# Patient Record
Sex: Male | Born: 1970 | Race: White | Hispanic: No | Marital: Married | Smoking: Current every day smoker
Health system: Southern US, Community
[De-identification: ages and names within clinical notes are randomized; demographics above are authoritative.]

## PROBLEM LIST (undated history)

## (undated) DIAGNOSIS — I38 Endocarditis, valve unspecified: Secondary | ICD-10-CM

## (undated) DIAGNOSIS — R7881 Bacteremia: Secondary | ICD-10-CM

## (undated) DIAGNOSIS — M4624 Osteomyelitis of vertebra, thoracic region: Secondary | ICD-10-CM

## (undated) DIAGNOSIS — E785 Hyperlipidemia, unspecified: Secondary | ICD-10-CM

## (undated) HISTORY — PX: POSTERIOR LAMINECTOMY THORACIC SPINE: SHX2252

---

## 2010-01-08 ENCOUNTER — Emergency Department: Payer: Self-pay | Admitting: Emergency Medicine

## 2012-03-02 ENCOUNTER — Emergency Department: Payer: Self-pay | Admitting: Emergency Medicine

## 2012-03-02 LAB — BASIC METABOLIC PANEL
Anion Gap: 5 — ABNORMAL LOW (ref 7–16)
Chloride: 106 mmol/L (ref 98–107)
Co2: 27 mmol/L (ref 21–32)
Creatinine: 1.13 mg/dL (ref 0.60–1.30)
Potassium: 4.5 mmol/L (ref 3.5–5.1)

## 2012-03-02 LAB — CBC
MCH: 29 pg (ref 26.0–34.0)
MCHC: 34.3 g/dL (ref 32.0–36.0)
MCV: 85 fL (ref 80–100)
Platelet: 181 10*3/uL (ref 150–440)
RDW: 13.8 % (ref 11.5–14.5)
WBC: 11 10*3/uL — ABNORMAL HIGH (ref 3.8–10.6)

## 2012-03-02 LAB — CK TOTAL AND CKMB (NOT AT ARMC)
CK, Total: 79 U/L (ref 35–232)
CK-MB: <0.5 ng/mL — ABNORMAL LOW (ref 0.5–3.6)

## 2013-09-08 ENCOUNTER — Ambulatory Visit: Payer: Self-pay | Admitting: Physician Assistant

## 2015-02-17 ENCOUNTER — Emergency Department
Admission: EM | Admit: 2015-02-17 | Discharge: 2015-02-18 | Disposition: A | Discharge status: Home / Self Care | Payer: Self-pay | Attending: Emergency Medicine | Admitting: Emergency Medicine

## 2015-02-17 ENCOUNTER — Emergency Department: Payer: Self-pay

## 2015-02-17 DIAGNOSIS — Z720 Tobacco use: Secondary | ICD-10-CM | POA: Insufficient documentation

## 2015-02-17 DIAGNOSIS — I10 Essential (primary) hypertension: Secondary | ICD-10-CM | POA: Insufficient documentation

## 2015-02-17 DIAGNOSIS — N23 Unspecified renal colic: Secondary | ICD-10-CM | POA: Insufficient documentation

## 2015-02-17 HISTORY — DX: Hyperlipidemia, unspecified: E78.5

## 2015-02-17 LAB — URINALYSIS COMPLETE WITH MICROSCOPIC (ARMC ONLY)
Bilirubin Urine: NEGATIVE
Glucose, UA: NEGATIVE mg/dL
Ketones, ur: NEGATIVE mg/dL
LEUKOCYTES UA: NEGATIVE
NITRITE: NEGATIVE
PH: 8 (ref 5.0–8.0)
PROTEIN: 100 mg/dL — AB
Specific Gravity, Urine: 1.019 (ref 1.005–1.030)
Squamous Epithelial / LPF: NONE SEEN

## 2015-02-17 LAB — BASIC METABOLIC PANEL
Anion gap: 9 (ref 5–15)
BUN: 15 mg/dL (ref 6–20)
CO2: 24 mmol/L (ref 22–32)
Calcium: 8.9 mg/dL (ref 8.9–10.3)
Chloride: 109 mmol/L (ref 101–111)
Creatinine, Ser: 1.29 mg/dL — ABNORMAL HIGH (ref 0.61–1.24)
GFR calc Af Amer: >60 mL/min (ref >60)
Glucose, Bld: 106 mg/dL — ABNORMAL HIGH (ref 65–99)
POTASSIUM: 4.2 mmol/L (ref 3.5–5.1)
SODIUM: 142 mmol/L (ref 135–145)

## 2015-02-17 LAB — CBC WITH DIFFERENTIAL/PLATELET
BASOS ABS: 0.1 10*3/uL (ref 0–0.1)
BASOS PCT: 1 %
EOS PCT: 1 %
Eosinophils Absolute: 0.1 10*3/uL (ref 0–0.7)
HCT: 39.4 % — ABNORMAL LOW (ref 40.0–52.0)
Hemoglobin: 13.1 g/dL (ref 13.0–18.0)
Lymphocytes Relative: 14 %
Lymphs Abs: 1.3 10*3/uL (ref 1.0–3.6)
MCH: 28.6 pg (ref 26.0–34.0)
MCHC: 33.1 g/dL (ref 32.0–36.0)
MCV: 86.3 fL (ref 80.0–100.0)
Monocytes Absolute: 0.4 10*3/uL (ref 0.2–1.0)
Monocytes Relative: 4 %
NEUTROS ABS: 7.4 10*3/uL — AB (ref 1.4–6.5)
Neutrophils Relative %: 80 %
PLATELETS: 311 10*3/uL (ref 150–440)
RBC: 4.57 MIL/uL (ref 4.40–5.90)
RDW: 12.9 % (ref 11.5–14.5)
WBC: 9.1 10*3/uL (ref 3.8–10.6)

## 2015-02-17 LAB — CK: Total CK: 82 U/L (ref 49–397)

## 2015-02-17 MED ORDER — ONDANSETRON HCL 4 MG/2ML IJ SOLN
INTRAMUSCULAR | Status: AC
  Administered 2015-02-17: 4 mg via INTRAVENOUS
  Filled 2015-02-17: qty 2

## 2015-02-17 MED ORDER — SODIUM CHLORIDE 0.9 % IV BOLUS (SEPSIS)
1000.0000 mL | Freq: Once | INTRAVENOUS | Status: AC
  Administered 2015-02-17: 1000 mL via INTRAVENOUS

## 2015-02-17 MED ORDER — KETOROLAC TROMETHAMINE 30 MG/ML IJ SOLN
30.0000 mg | Freq: Once | INTRAMUSCULAR | Status: AC
  Administered 2015-02-17: 30 mg via INTRAVENOUS

## 2015-02-17 MED ORDER — ONDANSETRON HCL 4 MG/2ML IJ SOLN
4.0000 mg | Freq: Once | INTRAMUSCULAR | Status: AC
  Administered 2015-02-17: 4 mg via INTRAVENOUS

## 2015-02-17 MED ORDER — KETOROLAC TROMETHAMINE 30 MG/ML IJ SOLN
INTRAMUSCULAR | Status: AC
  Administered 2015-02-17: 30 mg via INTRAVENOUS
  Filled 2015-02-17: qty 1

## 2015-02-17 NOTE — ED Notes (Signed)
Pt presents to ED with c/o severe abdominal pain. Per EMS, pt reports the abdominal pain started after mowing lawns all day, but reports keeping hydrated. Pt reports difficulty urinating; urine is dark. Pt reports nausea, but no vomiting. VS per EMS: BP 146/110, HR 60's, oral temp of 98.5, CBG 95

## 2015-02-17 NOTE — ED Provider Notes (Signed)
[facility] Emergency Department Provider Note ____________________________________________  Time seen: Approximately 9PM  I have reviewed the triage vital signs and the nursing notes.   HISTORY  Chief Complaint Abdominal Pain    HPI Derek Dunn is a 44 y.o. male presents with sudden onset abdominal pain over his umbilicus radiating to his right side and to his right lower quadrant at 6 PM. He is nauseated but has not vomited and had a normal normal bowel movement 2 h ago. He reports pain is worse with urinating and breathing.  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  He was drinking some water and Gatorade but noted his urine was dark, nonbloody. He denies any genital pain. He does endorse "smoking some dope " 2-3 hours ago and does not want his family to know. He does not think this is associated with the pain as he does this from time to time and this was not unusual for him. Denies chest pain or shortness of breath.  He has never had an episode like this before. EMS states his blood pressure was minimally elevated at 156/105. They gave him a 500 normal saline bolus.  Past Medical History  Diagnosis Date  . Hypertension   . Hyperlipidemia     There are no active problems to display for this patient.   History reviewed. No pertinent past surgical history.  No current outpatient prescriptions on file.  Allergies Review of patient's allergies indicates no known allergies.  No family history on file.  Social History History  Substance Use Topics  . Smoking status: Current Every Day Smoker  . Smokeless tobacco: Not on file  . Alcohol Use: No    Review of Systems Constitutional: No fever/chills Eyes: No visual changes. ENT: No URI Cardiovascular: Denies chest pain. Respiratory: Denies shortness of breath. Gastrointestinal: See history of present illness  Musculoskeletal: Negative for back pain. Skin:  Negative for rash. Neurological: Negative for headaches. Psychiatric:Normal mood Endocrine:No recent weight change 10-point ROS otherwise negative.  ____________________________________________   PHYSICAL EXAM:  VITAL SIGNS: ED Triage Vitals  Enc Vitals Group     BP 02/17/15 2122 152/90 mmHg     Pulse Rate 02/17/15 2122 63     Resp 02/17/15 2200 14     Temp 02/17/15 2122 98.2 F (36.8 C)     Temp Source 02/17/15 2122 Oral     SpO2 02/17/15 2122 100 %     Weight 02/17/15 2122 200 lb (90.719 kg)     Height 02/17/15 2122  (1.93 m)     Head Cir --      Peak Flow --      Pain Score 02/17/15 2123 10     Pain Loc --      Pain Edu? --      Excl. in GC? --    Constitutional: Alert and oriented. Moaning in pain Eyes: Conjunctivae are normal. PERRL. EOMI. Head: Atraumatic. Nose: No congestion/rhinnorhea. Mouth/Throat: Mucous membranes are slightly dry.  Oropharynx non-erythematous. Neck: No stridor.   Lymphatic: No cervical lymphadenopathy. Cardiovascular: Normal rate, regular rhythm. Grossly normal heart sounds.  Peripheral pulses 2+ B Respiratory: Normal respiratory effort.  No retractions. Lungs CTAB. Gastrointestinal: Soft; mod TTP periumbilical & R mid-abd without rebound or guarding. No distention. Normal bowel sounds.  GU: no pain or swelling Musculoskeletal: No lower extremity tenderness nor edema.  No calf TTP. Neurologic:  Normal speech and language. No gross focal neurologic  deficits are appreciated. Speech is normal.  Skin:  Skin is warm, dry and intact. No rash noted. Psychiatric: Mood and affect are normal. Speech and behavior are normal.  ____________________________________________   LABS (all labs ordered are listed, but only abnormal results are displayed)  Labs Reviewed  CBC WITH DIFFERENTIAL/PLATELET - Abnormal; Notable for the following:    HCT 39.4 (*)    Neutro Abs 7.4 (*)    All other components within normal limits  BASIC METABOLIC PANEL -  Abnormal; Notable for the following:    Glucose, Bld 106 (*)    Creatinine, Ser 1.29 (*)    All other components within normal limits  URINALYSIS COMPLETEWITH MICROSCOPIC ([facility] ONLY) - Abnormal; Notable for the following:    Color, Urine YELLOW (*)    APPearance HAZY (*)    Hgb urine dipstick 3+ (*)    Protein, ur 100 (*)    Bacteria, UA RARE (*)    All other components within normal limits  CK   ____________________________________________  RADIOLOGY  Renal u/s-P KUB-P ____________________________________________   INITIAL IMPRESSION / ASSESSMENT AND PLAN / ED COURSE  Pertinent labs & imaging results that were available during my care of the patient were reviewed by me and considered in my medical decision making (see chart for details).  Sudden onset of pain and severe nature suggestive of kidney stone. We will also assess for rhabdomyolysis as patient has been working outside in the heat and notes urine was dark. Will medicate for pain and reassess.  2230-Pt sleeping after toradol. Await renal u/s & KUB  0000-Pt in imaging.  ED care xferred to Dr. Manson Passey.  ____________________________________________   FINAL CLINICAL IMPRESSION(S) / ED DIAGNOSES  Renal colic    Maurilio Lovely, MD 02/18/15 1025

## 2015-02-17 NOTE — ED Notes (Addendum)
Per EMS, pt admits to smoking two crack rocks prior to the start of his abdominal pain. Pt was writhing in pain and moaning very loudly during arrival to the ED; since EMS's departure, pt has been very quiet and resting comfortably.

## 2015-02-17 NOTE — ED Notes (Signed)
Patient transported to Ultrasound 

## 2015-02-18 ENCOUNTER — Emergency Department: Payer: Self-pay

## 2015-02-18 ENCOUNTER — Other Ambulatory Visit: Payer: Self-pay

## 2015-02-18 MED ORDER — HYDROCODONE-ACETAMINOPHEN 5-325 MG PO TABS: 1.0000 | ORAL_TABLET | ORAL | Status: DC | PRN

## 2015-02-18 NOTE — Discharge Instructions (Signed)

## 2017-12-01 ENCOUNTER — Other Ambulatory Visit: Payer: Self-pay

## 2017-12-01 ENCOUNTER — Emergency Department
Admission: EM | Admit: 2017-12-01 | Discharge: 2017-12-02 | Disposition: A | Discharge status: Home / Self Care | Payer: Self-pay | Attending: Emergency Medicine | Admitting: Emergency Medicine

## 2017-12-01 ENCOUNTER — Encounter: Payer: Self-pay | Admitting: Emergency Medicine

## 2017-12-01 DIAGNOSIS — S72001A Fracture of unspecified part of neck of right femur, initial encounter for closed fracture: Secondary | ICD-10-CM

## 2017-12-01 DIAGNOSIS — L03113 Cellulitis of right upper limb: Secondary | ICD-10-CM | POA: Insufficient documentation

## 2017-12-01 DIAGNOSIS — F1910 Other psychoactive substance abuse, uncomplicated: Secondary | ICD-10-CM | POA: Insufficient documentation

## 2017-12-01 DIAGNOSIS — F17210 Nicotine dependence, cigarettes, uncomplicated: Secondary | ICD-10-CM | POA: Insufficient documentation

## 2017-12-01 DIAGNOSIS — F1990 Other psychoactive substance use, unspecified, uncomplicated: Secondary | ICD-10-CM

## 2017-12-01 LAB — CBC
HCT: 42.6 % (ref 40.0–52.0)
Hemoglobin: 14.3 g/dL (ref 13.0–18.0)
MCH: 28.3 pg (ref 26.0–34.0)
MCHC: 33.4 g/dL (ref 32.0–36.0)
MCV: 84.5 fL (ref 80.0–100.0)
PLATELETS: 174 10*3/uL (ref 150–440)
RBC: 5.05 MIL/uL (ref 4.40–5.90)
RDW: 14 % (ref 11.5–14.5)
WBC: 5.2 10*3/uL (ref 3.8–10.6)

## 2017-12-01 LAB — COMPREHENSIVE METABOLIC PANEL
ALT: 51 U/L (ref 17–63)
ANION GAP: 7 (ref 5–15)
AST: 38 U/L (ref 15–41)
Albumin: 4 g/dL (ref 3.5–5.0)
Alkaline Phosphatase: 75 U/L (ref 38–126)
BUN: 18 mg/dL (ref 6–20)
CO2: 24 mmol/L (ref 22–32)
Calcium: 8.8 mg/dL — ABNORMAL LOW (ref 8.9–10.3)
Chloride: 106 mmol/L (ref 101–111)
Creatinine, Ser: 0.93 mg/dL (ref 0.61–1.24)
GFR calc Af Amer: >60 mL/min (ref >60)
Glucose, Bld: 134 mg/dL — ABNORMAL HIGH (ref 65–99)
Potassium: 3.9 mmol/L (ref 3.5–5.1)
Sodium: 137 mmol/L (ref 135–145)
Total Bilirubin: 0.9 mg/dL (ref 0.3–1.2)
Total Protein: 6.9 g/dL (ref 6.5–8.1)

## 2017-12-01 LAB — SALICYLATE LEVEL

## 2017-12-01 LAB — ACETAMINOPHEN LEVEL: Acetaminophen (Tylenol), Serum: <10 ug/mL — ABNORMAL LOW (ref 10–30)

## 2017-12-01 LAB — ETHANOL

## 2017-12-01 MED ORDER — BACITRACIN ZINC 500 UNIT/GM EX OINT
TOPICAL_OINTMENT | Freq: Three times a day (TID) | CUTANEOUS | Status: DC
  Administered 2017-12-01: 1 via TOPICAL
  Filled 2017-12-01: qty 0.9

## 2017-12-01 MED ORDER — MUPIROCIN CALCIUM 2 % EX CREA
TOPICAL_CREAM | Freq: Three times a day (TID) | CUTANEOUS | Status: DC
  Filled 2017-12-01 (×2): qty 15

## 2017-12-01 NOTE — ED Notes (Signed)
Patient denies the need to void. Patient finished ED sandwich tray and has been resting with eyes closed on stretcher. Patient given warm blanket.

## 2017-12-01 NOTE — ED Triage Notes (Signed)
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  Denies SI/ HI.  Anxious but cooperative.

## 2017-12-01 NOTE — ED Notes (Addendum)
Patient is calm and cooperative. Patient given ED sandwich tray per request. NAD.

## 2017-12-01 NOTE — ED Notes (Signed)
Patient is aware of need for urine specimen.

## 2017-12-01 NOTE — BH Assessment (Addendum)
[facility] Assessment Progress Note     Faxed Referral Information to the Following Facilities:    [facility] ..........................Marland Kitchen.Marland Kitchen.Spoke with Ms. Worley at [facility] (4:21) am                                  [address]               who indicated that patient could arrive there after 8  [place]           am 12/02/17 Phone:  [phone]    [facility] [address] [place], [place] [address] Phone: [phone] Fax: [phone]

## 2017-12-01 NOTE — ED Notes (Signed)
Pharmacy called for medication. Patient is resting on hallway stretcher. NAD.

## 2017-12-01 NOTE — ED Provider Notes (Signed)
[facility] Emergency Department Provider Note  Time seen: 4:46 PM  I have reviewed the triage vital signs and the nursing notes.   HISTORY  Chief Complaint Addiction Problem    HPI Derek Dunn is a 47 y.o. male with a past medical history of hyperlipidemia, substance abuse, presents to the emergency department hoping to detox.  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  Patient states he is hoping to detox off these drugs.  Patient's only medical complaint today is a rash to his right upper extremity which has been present approximately 1 week with mild discomfort.  Largely negative review of systems.  No SI or HI.   Past Medical History:  Diagnosis Date  . Hyperlipidemia     There are no active problems to display for this patient.   History reviewed. No pertinent surgical history.  Prior to Admission medications   Medication Sig Start Date End Date Taking? Authorizing Provider  HYDROcodone-acetaminophen (NORCO) 5-325 MG per tablet Take 1-2 tablets by mouth every 4 (four) hours as needed for moderate pain. 02/18/15   Maurilio LovelyMcLaurin, Noelle, MD    No Known Allergies  History reviewed. No pertinent family history.  Social History Social History   Tobacco Use  . Smoking status: Current Every Day Smoker  . Smokeless tobacco: Never Used  Substance Use Topics  . Alcohol use: No  . Drug use: Yes    Types: IV, Methamphetamines    Comment: suboxone    Review of Systems Constitutional: Negative for fever. Eyes: Negative for visual complaints ENT: Negative for recent illness/congestion Cardiovascular: Negative for chest pain. Respiratory: Negative for shortness of breath. Gastrointestinal: Negative for abdominal pain Genitourinary: Negative for dysuria Musculoskeletal: Rash to right upper  extremity Skin: Rash to right upper extremity Neurological: Negative for headache All other ROS negative  ____________________________________________   PHYSICAL EXAM:  VITAL SIGNS: ED Triage Vitals  Enc Vitals Group     BP 12/01/17 1559 133/84     Pulse Rate 12/01/17 1559 77     Resp 12/01/17 1559 18     Temp 12/01/17 1559 98.8 F (37.1 C)     Temp Source 12/01/17 1559 Oral     SpO2 12/01/17 1559 97 %     Weight 12/01/17 1559 215 lb (97.5 kg)     Height 12/01/17 1559 6\' 4"  (1.93 m)     Head Circumference --      Peak Flow --      Pain Score 12/01/17 1608 6     Pain Loc --      Pain Edu? --      Excl. in GC? --     Constitutional: Alert and oriented. Well appearing and in no distress.  Initially sleeping, awakens easily to voice and answers questions and follows commands appropriately. Eyes: Normal exam ENT   Head: Normocephalic and atraumatic   Mouth/Throat: Mucous membranes are moist. Cardiovascular: Normal rate, regular rhythm. No murmur Respiratory: Normal respiratory effort without tachypnea nor retractions. Breath sounds are clear Gastrointestinal: Soft and nontender. No distention.   Musculoskeletal: Nontender with normal range of motion in all extremities.  Neurologic:  Normal speech and language. No gross focal neurologic deficits  Skin: Patient has a small 2 cm area of erythema to his right bicep, possible small area of cellulitis or what appears to be resolving  burn, patient denies any known burn. Psychiatric: Mood and affect are normal.   ____________________________________________   INITIAL IMPRESSION / ASSESSMENT AND PLAN / ED COURSE  Pertinent labs & imaging results that were available during my care of the patient were reviewed by me and considered in my medical decision making (see chart for details).  Patient presents to the emergency department for substance use hoping to detox.  Patient also has a 2 cm rash to his right upper extremity.   Differential would include cellulitis, burn, scab.  We will treat with topical mupirocin.  We will discussed with TTS for detox placement.  Patient agreeable to this plan of care.  Currently the patient appears very well, calm, cooperative, eating and drinking, no distress.  Patient's labs are resulted largely normal.  Alcohol is negative.  Urine drug screen pending.  TTS currently attempting to place in a detox facility.  Patient care signed out to oncoming physician.  ____________________________________________   FINAL CLINICAL IMPRESSION(S) / ED DIAGNOSES  Substance use Cellulitis    Minna Antis, MD 12/01/17 2131

## 2017-12-01 NOTE — BH Assessment (Addendum)
[facility] Assessment Progress Note      TTs met with patient to obtain his drug use history in order to refer him to detox facilities.  He states that he has been using Suboxone for the past five years and states that he has been using two 8 mg strips daily and he states that he has been using methamphetamine for the past ten years and states that he has been injecting a gram daily.  He states that the last use of these drugs was today.

## 2017-12-01 NOTE — ED Triage Notes (Addendum)
Pt arrived via POV states he has been living in a motel. Pt states he is here for help to come off crystal meth and suboxone.  Crystal meth: using x 10 years, used every day for the past 10 days - IV, smoke and snorting last used today.  Suboxone: daily use - no prescription.  Last used today  Pt has not been to detox anywhere.  Pt also concerned about wound to right upper arm.  Pt also states his nose is hurting and mouth is hurting. States his gums are sore as well.   Pt denies any SI/HI. Pt having a hard time concentrating and frequently shifting around in triage.

## 2017-12-02 NOTE — BH Assessment (Signed)
[facility] Assessment Progress Note      Spoke with Ms. Worley at [facility] (4:21) am  who indicated that patient could arrive for admission after 8 am on 12/02/17

## 2017-12-02 NOTE — ED Notes (Signed)
Spoke with patient about bed open in [place].  Pt. States he does not have transportation or money to get to [facility] in [place].

## 2017-12-02 NOTE — ED Provider Notes (Addendum)
-----------------------------------------   5:05 AM on 12/02/2017 -----------------------------------------   Blood pressure 109/63, pulse 65, temperature 98.8 F (37.1 C), temperature source Oral, resp. rate 16, height 6\' 4"  (1.93 m), weight 97.5 kg (215 lb), SpO2 97 %.  The patient had no acute events since last update.  Calm and cooperative at this time.    [facility] accepted to the [facility] [place].  Patient is not intoxicated and will be able to transport himself as he drove himself to the emergency department and he is clinically sober.  He will be discharged at this time.    Myrna BlazerSchaevitz, David Matthew, MD 12/02/17 0505    Myrna BlazerSchaevitz, David Matthew, MD 12/02/17 [phone]

## 2017-12-02 NOTE — ED Notes (Signed)
Spoke with patient about transportation to [facility] in [place].  Pt. States he has no friends or family that can take him.

## 2017-12-02 NOTE — ED Notes (Signed)
Called Ms. Worley @ [facility] [phone].  Ms. Bufford ButtnerWorley stated patient needed transportation if he was not accepted.   According to Ms. Worley patient would need to go through triage to be accepted.  If patient was not accepted they would have to provide own transportation, they do not provide transportation.

## 2017-12-02 NOTE — ED Notes (Signed)
Pt standing at nurse's station speaking with Dr Pershing ProudSchaevitz; pt wants MD to "make an acception for me" and admit him; pt says he's scared he's going to go out and shoot up "dope"; MD offered for pt to stay and speak with social worker after 7am; pt said "fu** it" and walked out; tried to stop pt to give him discharge papers with crisis services numbers but pt declined to take them and kept on walking towards the entrance to the ED

## 2017-12-02 NOTE — ED Provider Notes (Signed)
-----------------------------------------   6:16 AM on 12/02/2017 -----------------------------------------   Blood pressure 125/82, pulse 80, temperature 98.4 F (36.9 C), temperature source Oral, resp. rate 20, height 6\' 4"  (1.93 m), weight 97.5 kg (215 lb), SpO2 96 %.  Patient now saying that [place] [facility] and no one to pick him up to take him to the [facility].  He is asking me to "make an exception" to keep him here.  I mentioned possibly going to a shelter and that I would consult social work.  However, the patient became increasingly agitated and says that he is just going to walk out.  He says that he does not want his discharge paperwork.  Patient is not under commitment.  Not expressing suicidal or homicidal ideation.  Patient was walking without assistance with a normal gait.  Not slurring his speech.  Clinically sober at the time the patient eloped.       Myrna BlazerSchaevitz, David Matthew, MD 12/02/17 [phone]

## 2019-02-17 ENCOUNTER — Emergency Department
Admission: EM | Admit: 2019-02-17 | Discharge: 2019-02-17 | Disposition: A | Discharge status: Home / Self Care | Payer: Self-pay | Attending: Emergency Medicine | Admitting: Emergency Medicine

## 2019-02-17 ENCOUNTER — Other Ambulatory Visit: Payer: Self-pay

## 2019-02-17 ENCOUNTER — Encounter: Payer: Self-pay | Admitting: Emergency Medicine

## 2019-02-17 ENCOUNTER — Emergency Department: Payer: Self-pay

## 2019-02-17 DIAGNOSIS — F1510 Other stimulant abuse, uncomplicated: Secondary | ICD-10-CM | POA: Insufficient documentation

## 2019-02-17 DIAGNOSIS — F17200 Nicotine dependence, unspecified, uncomplicated: Secondary | ICD-10-CM | POA: Insufficient documentation

## 2019-02-17 DIAGNOSIS — M5441 Lumbago with sciatica, right side: Secondary | ICD-10-CM | POA: Insufficient documentation

## 2019-02-17 DIAGNOSIS — R202 Paresthesia of skin: Secondary | ICD-10-CM | POA: Insufficient documentation

## 2019-02-17 DIAGNOSIS — F1910 Other psychoactive substance abuse, uncomplicated: Secondary | ICD-10-CM | POA: Insufficient documentation

## 2019-02-17 MED ORDER — KETOROLAC TROMETHAMINE 30 MG/ML IJ SOLN
30.0000 mg | Freq: Once | INTRAMUSCULAR | Status: AC
  Administered 2019-02-17: 30 mg via INTRAMUSCULAR
  Filled 2019-02-17: qty 1

## 2019-02-17 MED ORDER — HYDROCODONE-ACETAMINOPHEN 5-325 MG PO TABS
1.0000 | ORAL_TABLET | Freq: Once | ORAL | Status: AC
  Administered 2019-02-17: 1 via ORAL
  Filled 2019-02-17: qty 1

## 2019-02-17 MED ORDER — PREDNISONE 10 MG (21) PO TBPK: ORAL_TABLET | ORAL | 0 refills | Status: DC

## 2019-02-17 MED ORDER — ONDANSETRON 4 MG PO TBDP
4.0000 mg | ORAL_TABLET | Freq: Once | ORAL | Status: AC
  Administered 2019-02-17: 4 mg via ORAL
  Filled 2019-02-17: qty 1

## 2019-02-17 MED ORDER — METHOCARBAMOL 500 MG PO TABS: 500.0000 mg | ORAL_TABLET | Freq: Three times a day (TID) | ORAL | 0 refills | Status: AC | PRN

## 2019-02-17 NOTE — ED Triage Notes (Signed)
Low back pain radiating to R leg since fell to sitting position yesterday.

## 2019-02-17 NOTE — ED Provider Notes (Signed)
[facility] Emergency Department Provider Note  ____________________________________________  Time seen: Approximately 4:28 PM  I have reviewed the triage vital signs and the nursing notes.   HISTORY  Chief Complaint Back Pain    HPI Derek Dunn is a 48 y.o. male presents to the ED with acute low back pain after a fall. Patient states that he was attempting to hang a hammock swing. He was testing out swing when it detached from a brick wall.  Patient sustained an abrasion to midline lumbar spine.  Patient states that he has had some tingling that radiates down right lower extremity since fall occurred.  No weakness.  No bowel or bladder incontinence or saddle anesthesia.  Patient denies hitting his head or his neck during fall.  He has been able to ambulate with some pain.  Pain is worse with standing and improved with rest.  No other alleviating measures have been attempted.        Past Medical History:  Diagnosis Date  . Hyperlipidemia     There are no active problems to display for this patient.   History reviewed. No pertinent surgical history.  Prior to Admission medications   Medication Sig Start Date End Date Taking? Authorizing Provider  methocarbamol (ROBAXIN) 500 MG tablet Take 1 tablet (500 mg total) by mouth every 8 (eight) hours as needed for up to 5 days. 02/17/19 02/22/19  Orvil FeilWoods, Miyako Oelke M, PA-C  predniSONE (STERAPRED UNI-PAK 21 TAB) 10 MG (21) TBPK tablet Take 6 tablets the first day, take 5 tablets the second day, take 4 tablets the third day, take 3 tablets the fourth day, take 2 tablets the fifth day, take 1 tablet the sixth day. 02/17/19   Orvil FeilWoods, Kasarah Sitts M, PA-C    Allergies Patient has no known allergies.  No family history on file.  Social History Social History   Tobacco Use  . Smoking status: Current Every Day Smoker  . Smokeless tobacco: Never Used  Substance Use Topics  . Alcohol use: No  . Drug use: Yes    Types:  IV, Methamphetamines    Comment: suboxone     Review of Systems  Constitutional: No fever/chills Eyes: No visual changes. No discharge ENT: No upper respiratory complaints. Cardiovascular: no chest pain. Respiratory: no cough. No SOB. Gastrointestinal: No abdominal pain.  No nausea, no vomiting.  No diarrhea.  No constipation. Musculoskeletal: Patient has low back pain.  Skin: Negative for rash, abrasions, lacerations, ecchymosis. Neurological: Negative for headaches, focal weakness or numbness.   ____________________________________________   PHYSICAL EXAM:  VITAL SIGNS: ED Triage Vitals  Enc Vitals Group     BP 02/17/19 1531 (!) 160/85     Pulse Rate 02/17/19 1531 (!) 53     Resp 02/17/19 1531 20     Temp 02/17/19 1531 (!) 97.5 F (36.4 C)     Temp Source 02/17/19 1531 Oral     SpO2 02/17/19 1531 99 %     Weight 02/17/19 1532 215 lb (97.5 kg)     Height 02/17/19 1532 6\' 3"  (1.905 m)     Head Circumference --      Peak Flow --      Pain Score 02/17/19 1532 8     Pain Loc --      Pain Edu? --      Excl. in GC? --      Constitutional: Alert and oriented. Well appearing and in no acute distress. Eyes: Conjunctivae are normal. PERRL. EOMI. Head:  Atraumatic. Cardiovascular: Normal rate, regular rhythm. Normal S1 and S2.  Good peripheral circulation. Respiratory: Normal respiratory effort without tachypnea or retractions. Lungs CTAB. Good air entry to the bases with no decreased or absent breath sounds. Musculoskeletal: Full range of motion to all extremities. No gross deformities appreciated.  Patient has some midline lumbar spine tenderness to palpation. Neurologic:  Normal speech and language. No gross focal neurologic deficits are appreciated.  Skin:  Skin is warm, dry and intact. No rash noted.  Patient has abrasion along midline lumbar spine over L4. Psychiatric: Mood and affect are normal. Speech and behavior are normal. Patient exhibits appropriate insight and  judgement.   ____________________________________________   LABS (all labs ordered are listed, but only abnormal results are displayed)  Labs Reviewed - No data to display ____________________________________________  EKG   ____________________________________________  RADIOLOGY I personally viewed and evaluated these images as part of my medical decision making, as well as reviewing the written report by the radiologist.    Dg Lumbar Spine 2-3 Views  Result Date: 02/17/2019 CLINICAL DATA:  Low back and right leg pain. EXAM: LUMBAR SPINE - 2-3 VIEW COMPARISON:  None. FINDINGS: Normal alignment of the lumbar vertebral bodies. 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 Lower lumbar facet disease without definite pars defects. Age advanced vascular calcifications are noted. The visualized bony pelvis appears intact. The SI joints appear normal. IMPRESSION: 1. Normal alignment and no acute bony findings. 2. Lower thoracic and upper lumbar degenerative changes. 3. Age advanced vascular calcifications. Electronically Signed   By: Rudie MeyerP.  Gallerani M.D.   On: 02/17/2019 16:49    ____________________________________________    PROCEDURES  Procedure(s) performed:    Procedures    Medications  ketorolac (TORADOL) 30 MG/ML injection 30 mg (30 mg Intramuscular Given 02/17/19 1722)  HYDROcodone-acetaminophen (NORCO/VICODIN) 5-325 MG per tablet 1 tablet (1 tablet Oral Given 02/17/19 1722)  ondansetron (ZOFRAN-ODT) disintegrating tablet 4 mg (4 mg Oral Given 02/17/19 1722)     ____________________________________________   INITIAL IMPRESSION / ASSESSMENT AND PLAN / ED COURSE  Pertinent labs & imaging results that were available during my care of the patient were reviewed by me and considered in my medical decision making (see chart for details).  Review of the [place] CSRS was performed in  accordance of the [facility] prior to dispensing any controlled drugs.         Assessment and plan Low back pain Patient presents to the emergency department with low back pain with right lower extremity radiculopathy.  On physical exam, patient had symmetric strength and normal sensation to touch.  X-ray examination revealed no acute bony abnormality in the lumbar spine.  Patient was given Norco and Toradol in the emergency department he reports his pain improved significantly.  Patient was discharged with prednisone and Robaxin.  He was advised to follow-up with primary care as needed.  All patient questions were answered.     ____________________________________________  FINAL CLINICAL IMPRESSION(S) / ED DIAGNOSES  Final diagnoses:  Acute bilateral low back pain with right-sided sciatica      NEW MEDICATIONS STARTED DURING THIS VISIT:  ED Discharge Orders         Ordered    predniSONE (STERAPRED UNI-PAK 21 TAB) 10 MG (21) TBPK tablet     02/17/19 1829    methocarbamol (ROBAXIN) 500 MG tablet  Every 8 hours PRN     02/17/19 1829  This chart was dictated using voice recognition software/Dragon. Despite best efforts to proofread, errors can occur which can change the meaning. Any change was purely unintentional.    Lannie Fields, PA-C 02/17/19 1919    Harvest Dark, MD 02/17/19 [phone]

## 2019-02-17 NOTE — ED Notes (Signed)
See triage note  Presents s/p fall  States he was sitting in a swing and it broke  Landed on lower back

## 2019-07-31 ENCOUNTER — Other Ambulatory Visit: Payer: Self-pay

## 2019-07-31 ENCOUNTER — Encounter: Payer: Self-pay | Admitting: Emergency Medicine

## 2019-07-31 ENCOUNTER — Emergency Department
Admission: EM | Admit: 2019-07-31 | Discharge: 2019-07-31 | Disposition: A | Discharge status: Home / Self Care | Payer: Self-pay | Attending: Emergency Medicine | Admitting: Emergency Medicine

## 2019-07-31 DIAGNOSIS — K036 Deposits [accretions] on teeth: Secondary | ICD-10-CM | POA: Insufficient documentation

## 2019-07-31 DIAGNOSIS — K029 Dental caries, unspecified: Secondary | ICD-10-CM | POA: Insufficient documentation

## 2019-07-31 DIAGNOSIS — F17200 Nicotine dependence, unspecified, uncomplicated: Secondary | ICD-10-CM | POA: Insufficient documentation

## 2019-07-31 MED ORDER — LIDOCAINE VISCOUS HCL 2 % MT SOLN: 10.0000 mL | OROMUCOSAL | 0 refills | Status: DC | PRN

## 2019-07-31 MED ORDER — AMOXICILLIN 500 MG PO CAPS: 500.0000 mg | ORAL_CAPSULE | Freq: Three times a day (TID) | ORAL | 0 refills | Status: DC

## 2019-07-31 MED ORDER — AMOXICILLIN 500 MG PO CAPS
500.0000 mg | ORAL_CAPSULE | Freq: Once | ORAL | Status: AC
  Administered 2019-07-31: 18:00:00 500 mg via ORAL
  Filled 2019-07-31: qty 1

## 2019-07-31 MED ORDER — LIDOCAINE VISCOUS HCL 2 % MT SOLN
15.0000 mL | Freq: Once | OROMUCOSAL | Status: AC
  Administered 2019-07-31: 15 mL via OROMUCOSAL
  Filled 2019-07-31: qty 15

## 2019-07-31 NOTE — Discharge Instructions (Addendum)
Please call dentist tomorrow or Monday for a follow-up appointment as soon as possible.   OPTIONS FOR DENTAL FOLLOW UP CARE  Derek Dunn Department of Health and Human Services - Local Safety Net Dental Clinics [email or web address]   [facility] [phone])  Derek Dunn [phone])  [place] [phone])  [facility] [phone])  [facility] [phone]) This clinic caters to the indigent population and is on a lottery system. Location: [employer] [facility], [employer], [address], [place] Clinic Hours: Wednesdays from 6pm - 9pm, patients seen by a lottery system. For dates, call or go to [email or web address] Services: Cleanings, fillings and simple extractions. Payment Options: DENTAL WORK IS FREE OF CHARGE. Bring proof of income or support. Best way to get seen: Arrive at 5:15 pm - this is a lottery, NOT first come/first serve, so arriving earlier will not increase your chances of being seen.     [facility] [phone] Select option 1 for emergencies   Location: [facility], [place], [address] [place] Clinic Hours: No walk-ins accepted - call the day before to schedule an appointment. Check in times are 9:30 am and 1:30 pm. Services: Simple extractions, temporary fillings, pulpectomy/pulp debridement, uncomplicated abscess drainage. Payment Options: PAYMENT IS DUE AT THE TIME OF SERVICE.  Fee is usually $100-200, additional surgical procedures (e.g. abscess drainage) may be extra. Cash, checks, Visa/MasterCard accepted.  Can file Medicaid if patient is covered for dental - patient should call case worker to check. No discount for [facility] patients. Best way to get seen: MUST call the day before and get onto the schedule. Can usually be seen the next  1-2 days. No walk-ins accepted.     [facility] [phone]   Location: [facility], [address], [place] Clinic Hours: M, W, Th, F 8am or 1:30pm, Tues 9a or 1:30 - first come/first served. Services: Simple extractions, temporary fillings, uncomplicated abscess drainage.  You do not need to be an [facility] resident. Payment Options: PAYMENT IS DUE AT THE TIME OF SERVICE. Dental insurance, otherwise sliding scale - bring proof of income or support. Depending on income and treatment needed, cost is usually $50-200. Best way to get seen: Arrive early as it is first come/first served.     [facility] [phone]   Location: [address] Clinic Hours: Mon-Thu 8a-5p Services: Most basic dental services including extractions and fillings. Payment Options: PAYMENT IS DUE AT THE TIME OF SERVICE. Sliding scale, up to 50% off - bring proof if income or support. Medicaid with dental option accepted. Best way to get seen: Call to schedule an appointment, can usually be seen within 2 weeks OR they will try to see walk-ins - show up at 8a or 2p (you may have to wait).     [facility] [phone] [place] RESIDENTS ONLY   Location: [facility], [address] [place], [place] [address] Clinic Hours: By appointment only. Monday - Thursday 8am-5pm, Friday 8am-12pm Services: Cleanings, fillings, extractions. Payment Options: PAYMENT IS DUE AT THE TIME OF SERVICE. Cash, Visa or MasterCard. Sliding scale - $30 minimum per service. Best way to get seen: Come in to office, complete packet and make an appointment - need proof of income or support monies for each household member and proof of [facility] residence. Usually takes about a month to get in.  [facility] [phone]   Location: [address],  [place] Clinic Hours: Walk-in Urgent Care Dental Services are offered Monday-Friday mornings only. The numbers of emergencies accepted daily is limited to the number of providers available. Maximum 15 - Mondays, Wednesdays & Thursdays Maximum 10 - Tuesdays & Fridays Services: You do not need to be a [facility] resident to be seen for a dental emergency. Emergencies are defined as pain, swelling, abnormal bleeding, or dental trauma. Walkins will receive x-rays if needed. NOTE: Dental cleaning is not an emergency. Payment Options: PAYMENT IS DUE AT THE TIME OF SERVICE. Minimum co-pay is $40.00 for uninsured patients. Minimum co-pay is $3.00 for Medicaid with dental coverage. Dental Insurance is accepted and must be presented at time of visit. Medicare does not cover dental. Forms of payment: Cash, credit card, checks. Best way to get seen: If not previously registered with the clinic, walk-in dental registration begins at 7:15 am and is on a first come/first serve basis. If previously registered with the clinic, call to make an appointment.     [facility] [place] ONLY   Location: [address] [place], [place] Clinic Hours: Mon-Thu 10a-2p Services: Extractions only! Payment Options: FREE (donations accepted) - bring proof of income or support Best way to get seen: Call and schedule an appointment OR come at 8am on the 1st Monday of every month (except for holidays) when it is first come/first served.     [facility] [phone]   Location: [place], [place] Clinic Hours: Friday mornings Services, Payment Options, Best way to get seen: Call for info

## 2019-07-31 NOTE — ED Triage Notes (Signed)
Lower L dental pain x 3 weeks.

## 2019-07-31 NOTE — ED Provider Notes (Signed)
[facility] Emergency Department Provider Note  ____________________________________________  Time seen: Approximately 5:55 PM  I have reviewed the triage vital signs and the nursing notes.   HISTORY  Chief Complaint Dental Pain    HPI Derek Dunn is a 48 y.o. male that presents to the emergency department for evaluation of acute on chronic dental pain.  Patient states that years of drug use has caused permanent damage to his teeth.  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 He has not followed up with a dentist because the dentist makes him nervous.  No known fevers.   Past Medical History:  Diagnosis Date  . Hyperlipidemia     There are no active problems to display for this patient.   History reviewed. No pertinent surgical history.  Prior to Admission medications   Medication Sig Start Date End Date Taking? Authorizing Provider  amoxicillin (AMOXIL) 500 MG capsule Take 1 capsule (500 mg total) by mouth 3 (three) times daily. 07/31/19   Enid Derry, PA-C  lidocaine (XYLOCAINE) 2 % solution Use as directed 10 mLs in the mouth or throat as needed. 07/31/19   Enid Derry, PA-C  predniSONE (STERAPRED UNI-PAK 21 TAB) 10 MG (21) TBPK tablet Take 6 tablets the first day, take 5 tablets the second day, take 4 tablets the third day, take 3 tablets the fourth day, take 2 tablets the fifth day, take 1 tablet the sixth day. 02/17/19   Orvil Feil, PA-C    Allergies Patient has no known allergies.  No family history on file.  Social History Social History   Tobacco Use  . Smoking status: Current Every Day Smoker  . Smokeless tobacco: Never Used  Substance Use Topics  . Alcohol use: No  . Drug use: Yes    Types: IV, Methamphetamines    Comment: suboxone     Review of Systems  Constitutional: No fever/chills Respiratory: No SOB. Gastrointestinal: No abdominal pain.  No nausea, no vomiting.   Musculoskeletal: Negative for musculoskeletal pain. Skin: Negative for rash, abrasions, lacerations, ecchymosis.   ____________________________________________   PHYSICAL EXAM:  VITAL SIGNS: ED Triage Vitals [07/31/19 1707]  Enc Vitals Group     BP 130/80     Pulse Rate (!) 55     Resp 18     Temp 98.2 F (36.8 C)     Temp Source Oral     SpO2 97 %     Weight 220 lb (99.8 kg)     Height 6\' 3"  (1.905 m)     Head Circumference      Peak Flow      Pain Score 9     Pain Loc      Pain Edu?      Excl. in GC?      Constitutional: Alert and oriented. Well appearing and in no acute distress. Eyes: Conjunctivae are normal. PERRL. EOMI. Head: Atraumatic. ENT:      Ears:      Nose: No congestion/rhinnorhea.      Mouth/Throat: Mucous membranes are moist.  Tender to palpation to anterior bottom incisor and left canine.  Poor dentition.  Multiple dental caries decayed to the gumline and past the gumline.  Plaque to teeth.  No swelling.  Able to open and close mouth normally.  Full range of motion of jaw. Neck: No stridor.  Cardiovascular: Normal rate, regular rhythm.  Good peripheral circulation. Respiratory: Normal respiratory effort without tachypnea or retractions. Lungs  CTAB. Good air entry to the bases with no decreased or absent breath sounds. Musculoskeletal: Full range of motion to all extremities. No gross deformities appreciated. Neurologic:  Normal speech and language. No gross focal neurologic deficits are appreciated.  Skin:  Skin is warm, dry and intact. No rash noted. Psychiatric: Mood and affect are normal. Speech and behavior are normal. Patient exhibits appropriate insight and judgement.   ____________________________________________   LABS (all labs ordered are listed, but only abnormal results are displayed)  Labs Reviewed - No data to  display ____________________________________________  EKG   ____________________________________________  RADIOLOGY   No results found.  ____________________________________________    PROCEDURES  Procedure(s) performed:    Procedures    Medications  amoxicillin (AMOXIL) capsule 500 mg (500 mg Oral Given 07/31/19 1818)  lidocaine (XYLOCAINE) 2 % viscous mouth solution 15 mL (15 mLs Mouth/Throat Given 07/31/19 1818)     ____________________________________________   INITIAL IMPRESSION / ASSESSMENT AND PLAN / ED COURSE  Pertinent labs & imaging results that were available during my care of the patient were reviewed by me and considered in my medical decision making (see chart for details).  Review of the [place] CSRS was performed in accordance of the [place] prior to dispensing any controlled drugs.   Patient's diagnosis is consistent with dental infection. Patient will be discharged home with prescriptions for amoxicillin and viscous lidocaine.  Initial doses were given in the emergency department patient is to follow up with dentist as directed.  Dental resources were provided.  Patient is given ED precautions to return to the ED for any worsening or new symptoms.  Derek Dunn was evaluated in Emergency Department on 07/31/2019 for the symptoms described in the history of present illness. He was evaluated in the context of the global COVID-19 pandemic, which necessitated consideration that the patient might be at risk for infection with the SARS-CoV-2 virus that causes COVID-19. Institutional protocols and algorithms that pertain to the evaluation of patients at risk for COVID-19 are in a state of rapid change based on information released by regulatory bodies including the CDC and federal and state organizations. These policies and algorithms were followed during the patient's care in the ED.   ____________________________________________  FINAL CLINICAL IMPRESSION(S)  / ED DIAGNOSES  Final diagnoses:  Pain due to dental caries  Dental plaque      NEW MEDICATIONS STARTED DURING THIS VISIT:  ED Discharge Orders         Ordered    amoxicillin (AMOXIL) 500 MG capsule  3 times daily     07/31/19 1838    lidocaine (XYLOCAINE) 2 % solution  As needed     07/31/19 1838              This chart was dictated using voice recognition software/Dragon. Despite best efforts to proofread, errors can occur which can change the meaning. Any change was purely unintentional.    Laban Emperor, PA-C 07/31/19 2314    Vanessa [place], MD 08/01/19 [phone]

## 2021-01-20 ENCOUNTER — Emergency Department: Payer: Self-pay

## 2021-01-20 ENCOUNTER — Encounter: Payer: Self-pay | Admitting: Emergency Medicine

## 2021-01-20 ENCOUNTER — Other Ambulatory Visit: Payer: Self-pay

## 2021-01-20 ENCOUNTER — Emergency Department
Admission: EM | Admit: 2021-01-20 | Discharge: 2021-01-20 | Disposition: A | Discharge status: Home / Self Care | Payer: Self-pay | Attending: Emergency Medicine | Admitting: Emergency Medicine

## 2021-01-20 DIAGNOSIS — F17200 Nicotine dependence, unspecified, uncomplicated: Secondary | ICD-10-CM | POA: Insufficient documentation

## 2021-01-20 DIAGNOSIS — L0390 Cellulitis, unspecified: Secondary | ICD-10-CM

## 2021-01-20 DIAGNOSIS — M79672 Pain in left foot: Secondary | ICD-10-CM | POA: Insufficient documentation

## 2021-01-20 LAB — COMPREHENSIVE METABOLIC PANEL
ALT: 18 U/L (ref 0–44)
AST: 24 U/L (ref 15–41)
Albumin: 3.8 g/dL (ref 3.5–5.0)
Alkaline Phosphatase: 61 U/L (ref 38–126)
Anion gap: 9 (ref 5–15)
BUN: 16 mg/dL (ref 6–20)
CO2: 27 mmol/L (ref 22–32)
Calcium: 8.9 mg/dL (ref 8.9–10.3)
Chloride: 104 mmol/L (ref 98–111)
Creatinine, Ser: 0.98 mg/dL (ref 0.61–1.24)
GFR, Estimated: >60 mL/min (ref >60)
Glucose, Bld: 108 mg/dL — ABNORMAL HIGH (ref 70–99)
Potassium: 3.9 mmol/L (ref 3.5–5.1)
Sodium: 140 mmol/L (ref 135–145)
Total Bilirubin: 0.4 mg/dL (ref 0.3–1.2)
Total Protein: 7 g/dL (ref 6.5–8.1)

## 2021-01-20 LAB — CBC WITH DIFFERENTIAL/PLATELET
Abs Immature Granulocytes: 0.01 10*3/uL (ref 0.00–0.07)
Basophils Absolute: 0 10*3/uL (ref 0.0–0.1)
Basophils Relative: 1 %
Eosinophils Absolute: 0.1 10*3/uL (ref 0.0–0.5)
Eosinophils Relative: 4 %
HCT: 36.2 % — ABNORMAL LOW (ref 39.0–52.0)
Hemoglobin: 11.8 g/dL — ABNORMAL LOW (ref 13.0–17.0)
Immature Granulocytes: 0 %
Lymphocytes Relative: 49 %
Lymphs Abs: 1.5 10*3/uL (ref 0.7–4.0)
MCH: 27.3 pg (ref 26.0–34.0)
MCHC: 32.6 g/dL (ref 30.0–36.0)
MCV: 83.6 fL (ref 80.0–100.0)
Monocytes Absolute: 0.4 10*3/uL (ref 0.1–1.0)
Monocytes Relative: 15 %
Neutro Abs: 0.9 10*3/uL — ABNORMAL LOW (ref 1.7–7.7)
Neutrophils Relative %: 31 %
Platelets: 213 10*3/uL (ref 150–400)
RBC: 4.33 MIL/uL (ref 4.22–5.81)
RDW: 13.8 % (ref 11.5–15.5)
Smear Review: NORMAL
WBC Morphology: ABNORMAL
WBC: 3 10*3/uL — ABNORMAL LOW (ref 4.0–10.5)
nRBC: 0 % (ref 0.0–0.2)

## 2021-01-20 MED ORDER — SULFAMETHOXAZOLE-TRIMETHOPRIM 800-160 MG PO TABS: 1.0000 | ORAL_TABLET | Freq: Two times a day (BID) | ORAL | 0 refills | Status: AC

## 2021-01-20 MED ORDER — SULFAMETHOXAZOLE-TRIMETHOPRIM 800-160 MG PO TABS
1.0000 | ORAL_TABLET | Freq: Once | ORAL | Status: AC
  Administered 2021-01-20: 1 via ORAL
  Filled 2021-01-20: qty 1

## 2021-01-20 NOTE — ED Provider Notes (Signed)
Patient [place] without blood clot. On exam patient does have erythema to the left leg and it is warm. At this time do feel patient likely has cellulitis. Discussed this with the patient. Will plan on discharging with prescription for antibiotics. Will give first dose here given pharmacies would be closed at this time.    Phineas Semen, MD 01/20/21 [phone]

## 2021-01-20 NOTE — ED Triage Notes (Signed)
Pt sleeping in WC, has to be woken up with each question. Pt answering questions with eyes closed. Pt reports is homeless and sleeps outside and has not been able to sleep. Pt reports swelling in both of his legs and feet for awhile. Pt denies pain or SOB.

## 2021-01-20 NOTE — ED Triage Notes (Signed)
First Nurse Note:  C/O swollen feet and legs.  Also c/o rash to left lower leg.  VS wnl.

## 2021-01-20 NOTE — Discharge Instructions (Signed)
Please seek medical attention for any high fevers, chest pain, shortness of breath, change in behavior, persistent vomiting, bloody stool or any other new or concerning symptoms.  

## 2021-01-20 NOTE — ED Notes (Signed)
X-ray at bedside

## 2021-01-20 NOTE — ED Provider Notes (Signed)
[facility] Emergency Department Provider Note  ____________________________________________   Event Date/Time   First MD Initiated Contact with Patient 01/20/21 1819     (approximate)  I have reviewed the triage vital signs and the nursing notes.   HISTORY  Chief Complaint Foot Swelling    HPI Derek Dunn is a 50 y.o. male presents emergency department stating that he has been homeless for the past year.  Patient is complaining of left foot swelling and left lower leg swelling.  States the bottom of his foot really hurts.  No fever or chills.  No chest pain or shortness of breath.  No cough or congestion.    Past Medical History:  Diagnosis Date  . Hyperlipidemia     There are no problems to display for this patient.   History reviewed. No pertinent surgical history.  Prior to Admission medications   Medication Sig Start Date End Date Taking? Authorizing Provider  amoxicillin (AMOXIL) 500 MG capsule Take 1 capsule (500 mg total) by mouth 3 (three) times daily. 07/31/19   Enid Derry, PA-C  lidocaine (XYLOCAINE) 2 % solution Use as directed 10 mLs in the mouth or throat as needed. 07/31/19   Enid Derry, PA-C  predniSONE (STERAPRED UNI-PAK 21 TAB) 10 MG (21) TBPK tablet Take 6 tablets the first day, take 5 tablets the second day, take 4 tablets the third day, take 3 tablets the fourth day, take 2 tablets the fifth day, take 1 tablet the sixth day. 02/17/19   Orvil Feil, PA-C    Allergies Patient has no known allergies.  No family history on file.  Social History Social History   Tobacco Use  . Smoking status: Current Every Day Smoker  . Smokeless tobacco: Never Used  Vaping Use  . Vaping Use: Never used  Substance Use Topics  . Alcohol use: No  . Drug use: Yes    Types: IV, Methamphetamines    Comment: suboxone    Review of Systems  Constitutional: No fever/chills Eyes: No visual changes. ENT: No sore  throat. Respiratory: Denies cough Cardiovascular: Denies chest pain Gastrointestinal: Denies abdominal pain Genitourinary: Negative for dysuria. Musculoskeletal: Negative for back pain.  Positive left lower leg and foot pain Skin: Negative for rash. Psychiatric: no mood changes,     ____________________________________________   PHYSICAL EXAM:  VITAL SIGNS: ED Triage Vitals  Enc Vitals Group     BP 01/20/21 1554 120/75     Pulse Rate 01/20/21 1610 85     Resp 01/20/21 1610 18     Temp 01/20/21 1610 97.9 F (36.6 C)     Temp Source 01/20/21 1610 Oral     SpO2 01/20/21 1610 96 %     Weight 01/20/21 1555 215 lb (97.5 kg)     Height 01/20/21 1555 6\' 3"  (1.905 m)     Head Circumference --      Peak Flow --      Pain Score 01/20/21 1555 7     Pain Loc --      Pain Edu? --      Excl. in GC? --     Constitutional: Alert and oriented. Well appearing and in no acute distress.  Patient is very drowsy and trying to sleep Eyes: Conjunctivae are normal.  Head: Atraumatic. Nose: No congestion/rhinnorhea. Mouth/Throat: Mucous membranes are moist.   Neck:  supple no lymphadenopathy noted Cardiovascular: Normal rate, regular rhythm. Heart sounds are normal Respiratory: Normal respiratory effort.  No retractions, lungs  c t a  GU: deferred Musculoskeletal: FROM all extremities, warm and well perfused, left foot is much larger than the right, 2+ pitting edema noted in the left lower extremity, redness noted from the foot to the lower leg, pulses intact Neurologic:  Normal speech and language.  Skin:  Skin is warm, dry and intact. No rash noted. Psychiatric: Mood and affect are normal. Speech and behavior are normal.  ____________________________________________   LABS (all labs ordered are listed, but only abnormal results are displayed)  Labs Reviewed  COMPREHENSIVE METABOLIC PANEL - Abnormal; Notable for the following components:      Result Value   Glucose, Bld 108 (*)    All  other components within normal limits  CBC WITH DIFFERENTIAL/PLATELET - Abnormal; Notable for the following components:   WBC 3.0 (*)    Hemoglobin 11.8 (*)    HCT 36.2 (*)    Neutro Abs 0.9 (*)    All other components within normal limits   ____________________________________________   ____________________________________________  RADIOLOGY  X-ray of the left tib-fib and left foot Ultrasound venous left lower extremity  ____________________________________________   PROCEDURES  Procedure(s) performed: No  Procedures    ____________________________________________   INITIAL IMPRESSION / ASSESSMENT AND PLAN / ED COURSE  Pertinent labs & imaging results that were available during my care of the patient were reviewed by me and considered in my medical decision making (see chart for details).   Patient is a 50 year old male presents with left lower leg pain.  See HPI.  Patient is also homeless.  Physical exam shows patient appears stable at this time.   He states he is just very sleepy since, he has not had a chance to get any sleep.   DDx: Cellulitis, DVT, fracture, osteomyelitis  X-ray of left tib-fib and left foot Ultrasound venous left lower extremity  CBC has a low WBC of 3.0, comprehensive metabolic panel is normal  X-ray of the left tib-fib and left foot reviewed by me and confirmed by radiology to have no acute abnormality  Care is being transferred to Dr. Derrill Kay at this time.   Derek Dunn was evaluated in Emergency Department on 01/20/2021 for the symptoms described in the history of present illness. He was evaluated in the context of the global COVID-19 pandemic, which necessitated consideration that the patient might be at risk for infection with the SARS-CoV-2 virus that causes COVID-19. Institutional protocols and algorithms that pertain to the evaluation of patients at risk for COVID-19 are in a state of rapid change based on information released by  regulatory bodies including the CDC and federal and state organizations. These policies and algorithms were followed during the patient's care in the ED.    As part of my medical decision making, I reviewed the following data within the electronic MEDICAL RECORD NUMBER Nursing notes reviewed and incorporated, Labs reviewed , Old chart reviewed, Radiograph reviewed , Evaluated by EM attending , Notes from prior ED visits and [place] Controlled Substance Database  ____________________________________________   FINAL CLINICAL IMPRESSION(S) / ED DIAGNOSES  Final diagnoses:  Foot pain, left      NEW MEDICATIONS STARTED DURING THIS VISIT:  New Prescriptions   No medications on file     Note:  This document was prepared using Dragon voice recognition software and may include unintentional dictation errors.    Faythe Ghee, PA-C 01/20/21 1944    Phineas Semen, MD 01/20/21 2006

## 2021-01-20 NOTE — ED Notes (Signed)
Pt states that he has been homeless for the past year, states that he has been having pain in the left foot for about a week, denies any known injury but states the bottom of his left foot is very tender, the left foot and left lower leg is about 2x bigger than the right, pt has redness and splotches moving up the left leg that stops prior to the left knee, pedal pulse palpated

## 2021-04-03 ENCOUNTER — Emergency Department
Admission: EM | Admit: 2021-04-03 | Discharge: 2021-04-04 | Disposition: A | Discharge status: Home / Self Care | Payer: Self-pay | Attending: Emergency Medicine | Admitting: Emergency Medicine

## 2021-04-03 ENCOUNTER — Other Ambulatory Visit: Payer: Self-pay

## 2021-04-03 DIAGNOSIS — T426X1A Poisoning by other antiepileptic and sedative-hypnotic drugs, accidental (unintentional), initial encounter: Secondary | ICD-10-CM | POA: Insufficient documentation

## 2021-04-03 DIAGNOSIS — F1910 Other psychoactive substance abuse, uncomplicated: Secondary | ICD-10-CM | POA: Insufficient documentation

## 2021-04-03 DIAGNOSIS — T50901A Poisoning by unspecified drugs, medicaments and biological substances, accidental (unintentional), initial encounter: Secondary | ICD-10-CM

## 2021-04-03 DIAGNOSIS — F17200 Nicotine dependence, unspecified, uncomplicated: Secondary | ICD-10-CM | POA: Insufficient documentation

## 2021-04-03 DIAGNOSIS — T401X1A Poisoning by heroin, accidental (unintentional), initial encounter: Secondary | ICD-10-CM | POA: Insufficient documentation

## 2021-04-03 DIAGNOSIS — T43621A Poisoning by amphetamines, accidental (unintentional), initial encounter: Secondary | ICD-10-CM | POA: Insufficient documentation

## 2021-04-03 DIAGNOSIS — X58XXXA Exposure to other specified factors, initial encounter: Secondary | ICD-10-CM | POA: Insufficient documentation

## 2021-04-03 NOTE — ED Triage Notes (Signed)
nights in motel; pinpoint pupils; he took heroin meth and gabapentin; 1mg  narcan nasal; 1mg  IV narcan

## 2021-04-03 NOTE — ED Provider Notes (Signed)
[facility] Emergency Department Provider Note  ____________________________________________  Time seen: Approximately 11:51 PM  I have reviewed the triage vital signs and the nursing notes.   HISTORY  Chief Complaint Drug Overdose   HPI Derek Dunn is a 50 y.o. male with a history of drug abuse who presents after an accidental overdose.  Patient was found unresponsive outside a motel.  Patient reports doing meth, heroin, and gabapentin.  Patient denies any suicidal intent and reports that he was trying to get high.  When EMS arrived patient had pinpoint pupils and a significantly reduced respiratory rate.  Patient was bagged while IN and IV Narcan was administered.  Patient returned to baseline.  Patient arrives with no complaints at this time.  Denies any trauma, headache, neck pain, back pain, chest pain, abdominal pain.  Patient is requesting food.  Past Medical History:  Diagnosis Date   Hyperlipidemia     Prior to Admission medications   Not on File    Allergies Patient has no known allergies.  History reviewed. No pertinent family history.  Social History Social History   Tobacco Use   Smoking status: Every Day   Smokeless tobacco: Never  Vaping Use   Vaping Use: Never used  Substance Use Topics   Alcohol use: No   Drug use: Yes    Types: IV, Methamphetamines    Comment: suboxone    Review of Systems  Constitutional: Negative for fever. Eyes: Negative for visual changes. ENT: Negative for sore throat. Neck: No neck pain  Cardiovascular: Negative for chest pain. Respiratory: Negative for shortness of breath. Gastrointestinal: Negative for abdominal pain, vomiting or diarrhea. Genitourinary: Negative for dysuria. Musculoskeletal: Negative for back pain. Skin: Negative for rash. Neurological: Negative for headaches, weakness or numbness. Psych: No SI or HI  ____________________________________________   PHYSICAL  EXAM:  VITAL SIGNS: ED Triage Vitals  Enc Vitals Group     BP      Pulse      Resp      Temp      Temp src      SpO2      Weight      Height      Head Circumference      Peak Flow      Pain Score      Pain Loc      Pain Edu?      Excl. in GC?     Constitutional: Alert and oriented, intoxicated.  HEENT:      Head: Normocephalic and atraumatic.         Eyes: Conjunctivae are normal. Sclera is non-icteric. Pinpoint pupils      Mouth/Throat: Mucous membranes are moist.       Neck: Supple with no signs of meningismus. Cardiovascular: Regular rate and rhythm. No murmurs, gallops, or rubs. 2+ symmetrical distal pulses are present in all extremities. No JVD. Respiratory: Normal respiratory effort. Lungs are clear to auscultation bilaterally.  Gastrointestinal: Soft, non tender, and non distended. Musculoskeletal:  No edema, cyanosis, or erythema of extremities. Neurologic: Normal speech and language. Face is symmetric. Moving all extremities. No gross focal neurologic deficits are appreciated. Skin: Skin is warm, dry and intact. No rash noted. Psychiatric: Mood and affect are normal. Speech and behavior are normal.  ____________________________________________   LABS (all labs ordered are listed, but only abnormal results are displayed)  Labs Reviewed  CBG MONITORING, ED   ____________________________________________  EKG  none  ____________________________________________  RADIOLOGY  none  ____________________________________________   PROCEDURES  Procedure(s) performed: yes .1-3 Lead EKG Interpretation  Date/Time: 04/03/2021 11:53 PM Performed by: Nita Sickle, MD Authorized by: Nita Sickle, MD     Interpretation: non-specific     ECG rate assessment: normal     Rhythm: sinus rhythm     Ectopy: none     Conduction: normal     Critical Care performed:  None ____________________________________________   INITIAL IMPRESSION / ASSESSMENT  AND PLAN / ED COURSE  50 y.o. male with a history of drug abuse who presents after an accidental overdose after using meth, heroin and gabapentin.  Patient with depressed respiratory rate but no respiratory or cardiac arrest and no CPR administered by EMS.  Patient did receive total of 2 mg of Narcan.  Patient arrives slightly intoxicated but pleasant.  Denies any suicidal intent.  Denies any complaints at this time.  We will monitor patient very closely on cardiac telemetry and with end-tidal CO2 for any recurrence of respiratory depression.  Patient with no signs of trauma.  _________________________ 4:18 AM on 04/04/2021 ----------------------------------------- Patient continues to be monitored carefully.  Has had respiratory rate between 9 and 12.  We will continue to monitor until patient is sober.    _____________________________________________ Please note:  Patient was evaluated in Emergency Department today for the symptoms described in the history of present illness. Patient was evaluated in the context of the global COVID-19 pandemic, which necessitated consideration that the patient might be at risk for infection with the SARS-CoV-2 virus that causes COVID-19. Institutional protocols and algorithms that pertain to the evaluation of patients at risk for COVID-19 are in a state of rapid change based on information released by regulatory bodies including the CDC and federal and state organizations. These policies and algorithms were followed during the patient's care in the ED.  Some ED evaluations and interventions may be delayed as a result of limited staffing during the pandemic.   [place] Controlled Substance Database was reviewed by me. ____________________________________________   FINAL CLINICAL IMPRESSION(S) / ED DIAGNOSES   Final diagnoses:  Accidental drug overdose, initial encounter  Polysubstance abuse (HCC)      NEW MEDICATIONS STARTED DURING THIS VISIT:  ED Discharge  Orders     None        Note:  This document was prepared using Dragon voice recognition software and may include unintentional dictation errors.    Nita Sickle, MD 04/04/21 [id number]

## 2021-04-04 NOTE — ED Provider Notes (Signed)
-----------------------------------------   7:05 AM on 04/04/2021 -----------------------------------------  Blood pressure 128/84, pulse 80, temperature 98.9 F (37.2 C), temperature source Oral, resp. rate 12, height 6\' 3"  (1.905 m), weight 90.7 kg, SpO2 98 %.  Assuming care from Dr. .  In short, Derek Dunn is a 50 y.o. male with a chief complaint of Drug Overdose .  Refer to the original H&P for additional details.  The current plan of care is to discharge home once clinically sober.  ----------------------------------------- 7:23 AM on 04/04/2021 ----------------------------------------- Patient reassessed and appears clinically sober, appropriate for discharge home.    06/04/2021, MD 04/04/21 [phone]

## 2021-04-04 NOTE — ED Notes (Signed)
This RN at bedside to give discharge instructions. Pt verbalizes understanding

## 2021-04-25 ENCOUNTER — Emergency Department
Admission: EM | Admit: 2021-04-25 | Discharge: 2021-04-25 | Disposition: A | Discharge status: Home / Self Care | Payer: Self-pay | Attending: Emergency Medicine | Admitting: Emergency Medicine

## 2021-04-25 ENCOUNTER — Emergency Department: Payer: Self-pay

## 2021-04-25 DIAGNOSIS — F17200 Nicotine dependence, unspecified, uncomplicated: Secondary | ICD-10-CM | POA: Insufficient documentation

## 2021-04-25 DIAGNOSIS — R4182 Altered mental status, unspecified: Secondary | ICD-10-CM | POA: Insufficient documentation

## 2021-04-25 DIAGNOSIS — Y900 Blood alcohol level of less than 20 mg/100 ml: Secondary | ICD-10-CM | POA: Insufficient documentation

## 2021-04-25 DIAGNOSIS — T50901A Poisoning by unspecified drugs, medicaments and biological substances, accidental (unintentional), initial encounter: Secondary | ICD-10-CM | POA: Insufficient documentation

## 2021-04-25 LAB — COMPREHENSIVE METABOLIC PANEL
ALT: 14 U/L (ref 0–44)
AST: 21 U/L (ref 15–41)
Albumin: 3.8 g/dL (ref 3.5–5.0)
Alkaline Phosphatase: 78 U/L (ref 38–126)
Anion gap: 4 — ABNORMAL LOW (ref 5–15)
BUN: 14 mg/dL (ref 6–20)
CO2: 29 mmol/L (ref 22–32)
Calcium: 8.8 mg/dL — ABNORMAL LOW (ref 8.9–10.3)
Chloride: 106 mmol/L (ref 98–111)
Creatinine, Ser: 0.87 mg/dL (ref 0.61–1.24)
GFR, Estimated: >60 mL/min (ref >60)
Glucose, Bld: 103 mg/dL — ABNORMAL HIGH (ref 70–99)
Potassium: 4.4 mmol/L (ref 3.5–5.1)
Sodium: 139 mmol/L (ref 135–145)
Total Bilirubin: 0.5 mg/dL (ref 0.3–1.2)
Total Protein: 6.6 g/dL (ref 6.5–8.1)

## 2021-04-25 LAB — URINE DRUG SCREEN, QUALITATIVE (ARMC ONLY)
Amphetamines, Ur Screen: POSITIVE — AB
Barbiturates, Ur Screen: NOT DETECTED
Benzodiazepine, Ur Scrn: NOT DETECTED
Cannabinoid 50 Ng, Ur ~~LOC~~: POSITIVE — AB
Cocaine Metabolite,Ur ~~LOC~~: NOT DETECTED
MDMA (Ecstasy)Ur Screen: NOT DETECTED
Methadone Scn, Ur: NOT DETECTED
Opiate, Ur Screen: NOT DETECTED
Phencyclidine (PCP) Ur S: NOT DETECTED
Tricyclic, Ur Screen: NOT DETECTED

## 2021-04-25 LAB — CBC WITH DIFFERENTIAL/PLATELET
Abs Immature Granulocytes: 0.01 10*3/uL (ref 0.00–0.07)
Basophils Absolute: 0 10*3/uL (ref 0.0–0.1)
Basophils Relative: 1 %
Eosinophils Absolute: 0.1 10*3/uL (ref 0.0–0.5)
Eosinophils Relative: 3 %
HCT: 37.2 % — ABNORMAL LOW (ref 39.0–52.0)
Hemoglobin: 11.9 g/dL — ABNORMAL LOW (ref 13.0–17.0)
Immature Granulocytes: 0 %
Lymphocytes Relative: 33 %
Lymphs Abs: 1.3 10*3/uL (ref 0.7–4.0)
MCH: 26.9 pg (ref 26.0–34.0)
MCHC: 32 g/dL (ref 30.0–36.0)
MCV: 84.2 fL (ref 80.0–100.0)
Monocytes Absolute: 0.2 10*3/uL (ref 0.1–1.0)
Monocytes Relative: 6 %
Neutro Abs: 2.2 10*3/uL (ref 1.7–7.7)
Neutrophils Relative %: 57 %
Platelets: 218 10*3/uL (ref 150–400)
RBC: 4.42 MIL/uL (ref 4.22–5.81)
RDW: 14.6 % (ref 11.5–15.5)
WBC: 3.9 10*3/uL — ABNORMAL LOW (ref 4.0–10.5)
nRBC: 0 % (ref 0.0–0.2)

## 2021-04-25 LAB — ETHANOL: Alcohol, Ethyl (B): <10 mg/dL (ref <10)

## 2021-04-25 MED ORDER — LACTATED RINGERS IV BOLUS
1000.0000 mL | Freq: Once | INTRAVENOUS | Status: AC
  Administered 2021-04-25: 1000 mL via INTRAVENOUS

## 2021-04-25 NOTE — ED Notes (Signed)
Pt lying in bed after CT scan. Pt at this time still obtunded but still stable vitals. Pt still responds to painful stimuli

## 2021-04-25 NOTE — ED Notes (Signed)
Pt able to awake to verbal stimuli. Pt asked to use restroom and urinal was given. Pt vitals stable.

## 2021-04-25 NOTE — ED Triage Notes (Signed)
Pt brought in by EMS with original call being fall. Pt found laying in parking lot, not responding to verbal stimuli, but did respond to painful. Pt arrives obtunded, vitals stable.

## 2021-04-25 NOTE — ED Notes (Signed)
Pt able to stand and walk on own accord. Pt tried giving urine sample several times with no ability to void. Pt bladder scanned with total of in bladder. Pt trying again soon .

## 2021-04-25 NOTE — ED Provider Notes (Signed)
[facility] Emergency Department Provider Note  ____________________________________________   Event Date/Time   First MD Initiated Contact with Patient 04/25/21 1851     (approximate)  I have reviewed the triage vital signs and the nursing notes.   HISTORY  Chief Complaint Altered Mental Status   HPI Derek Dunn is a 50 y.o. male past medical history of homelessness and polysubstance use most recently seen in the emergency room on 7/31 for polysubstance overdose accidental at that time who presents EMS after bystander called him when patient was noted to be unresponsive lying in a parking lot.  EMS states that patient had stable vitals but was fairly lethargic with them in route.  He had a normal blood sugar.  Denies any drug paraphernalia although were unable to obtain any additional history from patient.         Past Medical History:  Diagnosis Date  . Hyperlipidemia     There are no problems to display for this patient.   No past surgical history on file.  Prior to Admission medications   Not on File    Allergies Patient has no known allergies.  No family history on file.  Social History Social History   Tobacco Use  . Smoking status: Every Day  . Smokeless tobacco: Never  Vaping Use  . Vaping Use: Never used  Substance Use Topics  . Alcohol use: No  . Drug use: Yes    Types: IV, Methamphetamines    Comment: suboxone    Review of Systems  Review of Systems  Unable to perform ROS: Mental status change     ____________________________________________   PHYSICAL EXAM:  VITAL SIGNS: ED Triage Vitals  Enc Vitals Group     BP      Pulse      Resp      Temp      Temp src      SpO2      Weight      Height      Head Circumference      Peak Flow      Pain Score      Pain Loc      Pain Edu?      Excl. in GC?    Vitals:   04/25/21 2245 04/25/21 2254  BP: 117/88   Pulse: (!) 35 79  Resp:    Temp:     SpO2: 94%    Physical Exam Vitals and nursing note reviewed.  Constitutional:      Appearance: He is well-developed.  HENT:     Head: Normocephalic and atraumatic.     Right Ear: External ear normal.     Left Ear: External ear normal.     Nose: Nose normal.  Eyes:     Conjunctiva/sclera: Conjunctivae normal.  Cardiovascular:     Rate and Rhythm: Normal rate and regular rhythm.     Heart sounds: No murmur heard. Pulmonary:     Effort: Pulmonary effort is normal. No respiratory distress.     Breath sounds: Normal breath sounds.  Abdominal:     Palpations: Abdomen is soft.     Tenderness: There is no abdominal tenderness.  Musculoskeletal:     Cervical back: Neck supple.  Skin:    General: Skin is warm and dry.  Neurological:     Mental Status: He is lethargic.     Comments: Pinpoint pupils bilaterally which are otherwise symmetric.  Patient is able to give this examiner  thumbs up with both hands on command with significant stimulation.  Does also state his name but quickly falls back to sleep.  He does not otherwise participate in neurological exam.   Small abrasion under the lower lip just above the chin line.  No other obvious trauma to the face scalp head or neck chest abdomen back or extremities. .  ____________________________________________   LABS (all labs ordered are listed, but only abnormal results are displayed)  Labs Reviewed  CBC WITH DIFFERENTIAL/PLATELET - Abnormal; Notable for the following components:      Result Value   WBC 3.9 (*)    Hemoglobin 11.9 (*)    HCT 37.2 (*)    All other components within normal limits  COMPREHENSIVE METABOLIC PANEL - Abnormal; Notable for the following components:   Glucose, Bld 103 (*)    Calcium 8.8 (*)    Anion gap 4 (*)    All other components within normal limits  ETHANOL  URINE DRUG SCREEN, QUALITATIVE ([facility] ONLY)   ____________________________________________  EKG  Sinus rhythm with ventricular rate of  89, normal axis, unremarkable intervals without evidence of acute ischemia or significant arrhythmia. ____________________________________________  RADIOLOGY  ED MD interpretation: CT head and C-spine showed no evidence of acute intracranial hemorrhage, skull fracture or acute C-spine injury.  No other acute intracranial processes noted.  Official radiology report(s): CT HEAD WO CONTRAST ( )  Result Date: 04/25/2021 CLINICAL DATA:  Neck trauma.  Intoxicated. EXAM: CT HEAD WITHOUT CONTRAST CT CERVICAL SPINE WITHOUT CONTRAST TECHNIQUE: Multidetector CT imaging of the head and cervical spine was performed following the standard protocol without intravenous contrast. Multiplanar CT image reconstructions of the cervical spine were also generated. COMPARISON:  None. FINDINGS: CT HEAD FINDINGS Brain: No evidence of acute infarction, hemorrhage, hydrocephalus, extra-axial collection or mass lesion/mass effect. Vascular: No hyperdense vessel or unexpected calcification. Skull: Normal. Negative for fracture or focal lesion. Sinuses/Orbits: The paranasal sinuses and mastoid air cells are clear. Other: None. CT CERVICAL SPINE FINDINGS Alignment: Straightening of normal cervical lordosis. The vertebral body heights are well preserved. Facet joints are all aligned. Skull base and vertebrae: No acute fracture. No primary bone lesion or focal pathologic process. Soft tissues and spinal canal: No prevertebral fluid or swelling. No visible canal hematoma. Disc levels: Disc space narrowing and endplate spurring noted at C5-6 and C6-7. Upper chest: Negative. Other: None IMPRESSION: 1. No acute intracranial abnormalities. 2. No evidence for cervical spine fracture. 3. Cervical degenerative disc disease. Electronically Signed   By: Signa Kell M.D.   On: 04/25/2021 19:53   CT Cervical Spine Wo Contrast  Result Date: 04/25/2021 CLINICAL DATA:  Neck trauma.  Intoxicated. EXAM: CT HEAD WITHOUT CONTRAST CT CERVICAL SPINE  WITHOUT CONTRAST TECHNIQUE: Multidetector CT imaging of the head and cervical spine was performed following the standard protocol without intravenous contrast. Multiplanar CT image reconstructions of the cervical spine were also generated. COMPARISON:  None. FINDINGS: CT HEAD FINDINGS Brain: No evidence of acute infarction, hemorrhage, hydrocephalus, extra-axial collection or mass lesion/mass effect. Vascular: No hyperdense vessel or unexpected calcification. Skull: Normal. Negative for fracture or focal lesion. Sinuses/Orbits: The paranasal sinuses and mastoid air cells are clear. Other: None. CT CERVICAL SPINE FINDINGS Alignment: Straightening of normal cervical lordosis. The vertebral body heights are well preserved. Facet joints are all aligned. Skull base and vertebrae: No acute fracture. No primary bone lesion or focal pathologic process. Soft tissues and spinal canal: No prevertebral fluid or swelling. No visible canal hematoma. Disc levels: Disc  space narrowing and endplate spurring noted at C5-6 and C6-7. Upper chest: Negative. Other: None IMPRESSION: 1. No acute intracranial abnormalities. 2. No evidence for cervical spine fracture. 3. Cervical degenerative disc disease. Electronically Signed   By: Signa Kell M.D.   On: 04/25/2021 19:53    ____________________________________________   PROCEDURES  Procedure(s) performed (including Critical Care):  Procedures   ____________________________________________   INITIAL IMPRESSION / ASSESSMENT AND PLAN / ED COURSE      Presents with above to history exam via EMS from [facility] where he was found lying lethargic on the ground.  Per EMS he has stable vitals.  He is unable provide any history and is quite sleepy with pinpoint pupils.  EMS states they were concerned he may have overdosed on arrival identify any paraphernalia and was not working much history from the patient.  On arrival patient is able to state his name and give examiner thumbs  up but otherwise clear falls back to sleep.  He does have pinpoint pupils.  He is unable to further participate in the history or exam.  Initial differential includes acute intoxication and drug overdose, SAH, metabolic derangements, infectious process and CVA.   ECG shows evidence of arrhythmia or ischemia.   CT head and C-spine are unremarkable.  Serum ethanol is undetectable.  CMP shows no significant electrolyte or metabolic derangements.  CBC shows no significant acute derangements.  Patient was observed for approximately 3 hours on end-tidal capnography and did not require any supplemental oxygen.  He has slow improvement of mental status on several reassessments he was noted to be standing walking in his room ambulating steady gait.  Stated not remember exactly what happened but denied any acute pain or other complaints.  He has a nonfocal neuro exam does not seem clinically intoxicated on my final assessment.  Impression is possible accidental overdose.  Advised of dangers of ingesting illegal or unprescribed drugs.  Discharged stable condition.  Strict return cautions advised and discussed.        ____________________________________________   FINAL CLINICAL IMPRESSION(S) / ED DIAGNOSES  Final diagnoses:  Accidental overdose, initial encounter    Medications  lactated ringers bolus 1,000 mL (0 mLs Intravenous Stopped 04/25/21 2024)  lactated ringers bolus 1,000 mL (0 mLs Intravenous Stopped 04/25/21 2254)     ED Discharge Orders     None        Note:  This document was prepared using Dragon voice recognition software and may include unintentional dictation errors.    Gilles Chiquito, MD 04/25/21 [id number]

## 2021-09-27 ENCOUNTER — Emergency Department
Admission: EM | Admit: 2021-09-27 | Discharge: 2021-09-27 | Disposition: A | Discharge status: Home / Self Care | Payer: Self-pay | Source: Home / Self Care

## 2022-01-04 ENCOUNTER — Emergency Department: Payer: Self-pay

## 2022-01-04 ENCOUNTER — Inpatient Hospital Stay
Admission: EM | Admit: 2022-01-04 | Discharge: 2022-01-06 | DRG: 871 | Discharge status: Against Medical Advice | Payer: Self-pay | Attending: Internal Medicine | Admitting: Internal Medicine

## 2022-01-04 ENCOUNTER — Other Ambulatory Visit: Payer: Self-pay

## 2022-01-04 DIAGNOSIS — R109 Unspecified abdominal pain: Secondary | ICD-10-CM

## 2022-01-04 DIAGNOSIS — Z5329 Procedure and treatment not carried out because of patient's decision for other reasons: Secondary | ICD-10-CM | POA: Diagnosis present

## 2022-01-04 DIAGNOSIS — N390 Urinary tract infection, site not specified: Secondary | ICD-10-CM | POA: Diagnosis present

## 2022-01-04 DIAGNOSIS — Z20822 Contact with and (suspected) exposure to covid-19: Secondary | ICD-10-CM | POA: Diagnosis present

## 2022-01-04 DIAGNOSIS — B9561 Methicillin susceptible Staphylococcus aureus infection as the cause of diseases classified elsewhere: Secondary | ICD-10-CM | POA: Diagnosis present

## 2022-01-04 DIAGNOSIS — M545 Low back pain, unspecified: Secondary | ICD-10-CM | POA: Diagnosis present

## 2022-01-04 DIAGNOSIS — R4182 Altered mental status, unspecified: Secondary | ICD-10-CM

## 2022-01-04 DIAGNOSIS — F1910 Other psychoactive substance abuse, uncomplicated: Secondary | ICD-10-CM | POA: Diagnosis present

## 2022-01-04 DIAGNOSIS — Z720 Tobacco use: Secondary | ICD-10-CM | POA: Diagnosis present

## 2022-01-04 DIAGNOSIS — Z5900 Homelessness unspecified: Secondary | ICD-10-CM

## 2022-01-04 DIAGNOSIS — M009 Pyogenic arthritis, unspecified: Secondary | ICD-10-CM | POA: Diagnosis present

## 2022-01-04 DIAGNOSIS — E785 Hyperlipidemia, unspecified: Secondary | ICD-10-CM | POA: Diagnosis present

## 2022-01-04 DIAGNOSIS — F17210 Nicotine dependence, cigarettes, uncomplicated: Secondary | ICD-10-CM | POA: Diagnosis present

## 2022-01-04 DIAGNOSIS — G061 Intraspinal abscess and granuloma: Secondary | ICD-10-CM | POA: Diagnosis present

## 2022-01-04 DIAGNOSIS — F1510 Other stimulant abuse, uncomplicated: Secondary | ICD-10-CM | POA: Diagnosis present

## 2022-01-04 DIAGNOSIS — A4101 Sepsis due to Methicillin susceptible Staphylococcus aureus: Principal | ICD-10-CM | POA: Diagnosis present

## 2022-01-04 DIAGNOSIS — R509 Fever, unspecified: Secondary | ICD-10-CM | POA: Insufficient documentation

## 2022-01-04 DIAGNOSIS — R7881 Bacteremia: Secondary | ICD-10-CM | POA: Diagnosis present

## 2022-01-04 LAB — CBC WITH DIFFERENTIAL/PLATELET
Abs Immature Granulocytes: 0.04 10*3/uL (ref 0.00–0.07)
Basophils Absolute: 0 10*3/uL (ref 0.0–0.1)
Basophils Relative: 0 %
Eosinophils Absolute: 0 10*3/uL (ref 0.0–0.5)
Eosinophils Relative: 0 %
HCT: 38.5 % — ABNORMAL LOW (ref 39.0–52.0)
Hemoglobin: 12.7 g/dL — ABNORMAL LOW (ref 13.0–17.0)
Immature Granulocytes: 0 %
Lymphocytes Relative: 8 %
Lymphs Abs: 0.7 10*3/uL (ref 0.7–4.0)
MCH: 26.8 pg (ref 26.0–34.0)
MCHC: 33 g/dL (ref 30.0–36.0)
MCV: 81.4 fL (ref 80.0–100.0)
Monocytes Absolute: 0.5 10*3/uL (ref 0.1–1.0)
Monocytes Relative: 5 %
Neutro Abs: 7.9 10*3/uL — ABNORMAL HIGH (ref 1.7–7.7)
Neutrophils Relative %: 87 %
Platelets: 172 10*3/uL (ref 150–400)
RBC: 4.73 MIL/uL (ref 4.22–5.81)
RDW: 14.3 % (ref 11.5–15.5)
WBC: 9.2 10*3/uL (ref 4.0–10.5)
nRBC: 0 % (ref 0.0–0.2)

## 2022-01-04 LAB — URINALYSIS, COMPLETE (UACMP) WITH MICROSCOPIC
Bilirubin Urine: NEGATIVE
Glucose, UA: NEGATIVE mg/dL
Ketones, ur: NEGATIVE mg/dL
Leukocytes,Ua: NEGATIVE
Nitrite: POSITIVE — AB
Protein, ur: 30 mg/dL — AB
Specific Gravity, Urine: 1.025 (ref 1.005–1.030)
pH: 6 (ref 5.0–8.0)

## 2022-01-04 LAB — COMPREHENSIVE METABOLIC PANEL
ALT: 13 U/L (ref 0–44)
AST: 16 U/L (ref 15–41)
Albumin: 3.9 g/dL (ref 3.5–5.0)
Alkaline Phosphatase: 77 U/L (ref 38–126)
Anion gap: 8 (ref 5–15)
BUN: 16 mg/dL (ref 6–20)
CO2: 24 mmol/L (ref 22–32)
Calcium: 8.9 mg/dL (ref 8.9–10.3)
Chloride: 98 mmol/L (ref 98–111)
Creatinine, Ser: 1.01 mg/dL (ref 0.61–1.24)
GFR, Estimated: >60 mL/min (ref >60)
Glucose, Bld: 137 mg/dL — ABNORMAL HIGH (ref 70–99)
Potassium: 3.7 mmol/L (ref 3.5–5.1)
Sodium: 130 mmol/L — ABNORMAL LOW (ref 135–145)
Total Bilirubin: 1.2 mg/dL (ref 0.3–1.2)
Total Protein: 7.4 g/dL (ref 6.5–8.1)

## 2022-01-04 LAB — PROTIME-INR
INR: 1.2 (ref 0.8–1.2)
Prothrombin Time: 15 seconds (ref 11.4–15.2)

## 2022-01-04 LAB — LACTIC ACID, PLASMA: Lactic Acid, Venous: 1.1 mmol/L (ref 0.5–1.9)

## 2022-01-04 LAB — RESP PANEL BY RT-PCR (FLU A&B, COVID) ARPGX2
Influenza A by PCR: NEGATIVE
Influenza B by PCR: NEGATIVE
SARS Coronavirus 2 by RT PCR: NEGATIVE

## 2022-01-04 LAB — APTT: aPTT: 44 seconds — ABNORMAL HIGH (ref 24–36)

## 2022-01-04 MED ORDER — ONDANSETRON HCL 4 MG/2ML IJ SOLN: 4.0000 mg | Freq: Four times a day (QID) | INTRAMUSCULAR | Status: DC | PRN

## 2022-01-04 MED ORDER — ACETAMINOPHEN 325 MG PO TABS
650.0000 mg | ORAL_TABLET | Freq: Once | ORAL | Status: AC
  Administered 2022-01-04: 650 mg via ORAL

## 2022-01-04 MED ORDER — ACETAMINOPHEN 325 MG PO TABS: 650.0000 mg | ORAL_TABLET | Freq: Four times a day (QID) | ORAL | Status: DC | PRN

## 2022-01-04 MED ORDER — NALOXONE HCL 2 MG/2ML IJ SOSY: 2.0000 mg | PREFILLED_SYRINGE | Freq: Once | INTRAMUSCULAR | Status: AC

## 2022-01-04 MED ORDER — CEFEPIME HCL 2 G IV SOLR
2.0000 g | Freq: Three times a day (TID) | INTRAVENOUS | Status: DC
  Administered 2022-01-05: 2 g via INTRAVENOUS
  Filled 2022-01-04: qty 12.5

## 2022-01-04 MED ORDER — LACTATED RINGERS IV BOLUS (SEPSIS)
1000.0000 mL | Freq: Once | INTRAVENOUS | Status: AC
  Administered 2022-01-04: 1000 mL via INTRAVENOUS

## 2022-01-04 MED ORDER — SODIUM CHLORIDE 0.9 % IV SOLN
2.0000 g | Freq: Once | INTRAVENOUS | Status: AC
  Administered 2022-01-04: 2 g via INTRAVENOUS
  Filled 2022-01-04: qty 12.5

## 2022-01-04 MED ORDER — VANCOMYCIN HCL IN DEXTROSE 1-5 GM/200ML-% IV SOLN
1000.0000 mg | Freq: Once | INTRAVENOUS | Status: AC
  Administered 2022-01-04: 1000 mg via INTRAVENOUS
  Filled 2022-01-04: qty 200

## 2022-01-04 MED ORDER — LACTATED RINGERS IV SOLN: INTRAVENOUS | Status: DC

## 2022-01-04 MED ORDER — ENOXAPARIN SODIUM 40 MG/0.4ML IJ SOSY
40.0000 mg | PREFILLED_SYRINGE | INTRAMUSCULAR | Status: DC
  Administered 2022-01-06: 40 mg via SUBCUTANEOUS
  Filled 2022-01-04: qty 0.4

## 2022-01-04 MED ORDER — VANCOMYCIN HCL 1500 MG/300ML IV SOLN
1500.0000 mg | Freq: Once | INTRAVENOUS | Status: AC
  Administered 2022-01-05: 1500 mg via INTRAVENOUS
  Filled 2022-01-04: qty 300

## 2022-01-04 MED ORDER — ACETAMINOPHEN 650 MG RE SUPP: 650.0000 mg | Freq: Four times a day (QID) | RECTAL | Status: DC | PRN

## 2022-01-04 MED ORDER — VANCOMYCIN HCL 1500 MG/300ML IV SOLN
1500.0000 mg | Freq: Two times a day (BID) | INTRAVENOUS | Status: DC
  Filled 2022-01-04: qty 300

## 2022-01-04 MED ORDER — ONDANSETRON HCL 4 MG PO TABS: 4.0000 mg | ORAL_TABLET | Freq: Four times a day (QID) | ORAL | Status: DC | PRN

## 2022-01-04 MED ORDER — ACETAMINOPHEN 10 MG/ML IV SOLN
1000.0000 mg | Freq: Four times a day (QID) | INTRAVENOUS | Status: AC
  Administered 2022-01-04 – 2022-01-05 (×3): 1000 mg via INTRAVENOUS
  Filled 2022-01-04 (×4): qty 100

## 2022-01-04 MED ORDER — IOHEXOL 300 MG/ML  SOLN
100.0000 mL | Freq: Once | INTRAMUSCULAR | Status: AC | PRN
  Administered 2022-01-04: 100 mL via INTRAVENOUS

## 2022-01-04 MED ORDER — METRONIDAZOLE 500 MG/100ML IV SOLN: 500.0000 mg | Freq: Two times a day (BID) | INTRAVENOUS | Status: DC

## 2022-01-04 MED ORDER — ACETAMINOPHEN 325 MG PO TABS
ORAL_TABLET | ORAL | Status: AC
  Filled 2022-01-04: qty 2

## 2022-01-04 MED ORDER — SODIUM CHLORIDE 0.9 % IV SOLN: INTRAVENOUS | Status: AC

## 2022-01-04 MED ORDER — METRONIDAZOLE 500 MG/100ML IV SOLN
500.0000 mg | Freq: Once | INTRAVENOUS | Status: AC
  Administered 2022-01-04: 500 mg via INTRAVENOUS
  Filled 2022-01-04: qty 100

## 2022-01-04 MED ORDER — NALOXONE HCL 2 MG/2ML IJ SOSY
PREFILLED_SYRINGE | INTRAMUSCULAR | Status: AC
  Administered 2022-01-04: 2 mg via INTRAVENOUS
  Filled 2022-01-04: qty 2

## 2022-01-04 MED ORDER — METRONIDAZOLE 500 MG/100ML IV SOLN
500.0000 mg | Freq: Two times a day (BID) | INTRAVENOUS | Status: DC
  Administered 2022-01-05: 500 mg via INTRAVENOUS
  Filled 2022-01-04: qty 100

## 2022-01-04 NOTE — ED Provider Notes (Signed)
? ?[facility] ?Provider Note ? ? ? Event Date/Time  ? First MD Initiated Contact with Patient 01/04/22 2045   ?  (approximate) ? ? ?History  ? ?Hip Pain ? ? ?HPI ? ?Eshawn Coor is a 51 y.o. male who comes in complaining of pain in his left flank below the kidney and above the pelvic brim.  He has 103 fever.  He is very groggy but easily arousable and alert oriented when he is aroused.  Complains of severe pain on palpation of the area but no pain in his abdomen.  He says has been going on for couple days. ? ?  ? ? ?Physical Exam  ? ?Triage Vital Signs: ?ED Triage Vitals  ?Enc Vitals Group  ?   BP 01/04/22 1857 (!) 142/85  ?   Pulse Rate 01/04/22 1857 98  ?   Resp 01/04/22 1857 18  ?   Temp 01/04/22 1857 (!) 103.1 ?F (39.5 ?C)  ?   Temp Source 01/04/22 1857 Oral  ?   SpO2 01/04/22 1857 98 %  ?   Weight --   ?   Height --   ?   Head Circumference --   ?   Peak Flow --   ?   Pain Score 01/04/22 1859 8  ?   Pain Loc --   ?   Pain Edu? --   ?   Excl. in GC? --   ? ? ?Most recent vital signs: ?Vitals:  ? 01/04/22 2100 01/04/22 2236  ?BP: 128/72   ?Pulse:  91  ?Resp: (!) 24 (!) 24  ?Temp:    ?SpO2:  100%  ? ? ? ?General: Sleepy but arousable complaining of pain as above ?Neck supple ?Mouth no erythema or exudate ?CV:  Good peripheral perfusion.  Heart regular rate and rhythm no audible murmur ?Resp:  Normal effort.  Lungs are clear ?Abd:  No distention.  Abdomen is soft bowel sounds are positive there is no abdominal tenderness ?Extremities full range of motion no tenderness ?Back tenderness as described in HPI ? ? ?ED Results / Procedures / Treatments  ? ?Labs ?(all labs ordered are listed, but only abnormal results are displayed) ?Labs Reviewed  ?COMPREHENSIVE METABOLIC PANEL - Abnormal; Notable for the following components:  ?    Result Value  ? Sodium 130 (*)   ? Glucose, Bld 137 (*)   ? All other components within normal limits  ?CBC WITH DIFFERENTIAL/PLATELET - Abnormal; Notable for the  following components:  ? Hemoglobin 12.7 (*)   ? HCT 38.5 (*)   ? Neutro Abs 7.9 (*)   ? All other components within normal limits  ?APTT - Abnormal; Notable for the following components:  ? aPTT 44 (*)   ? All other components within normal limits  ?URINALYSIS, COMPLETE (UACMP) WITH MICROSCOPIC - Abnormal; Notable for the following components:  ? Color, Urine YELLOW (*)   ? APPearance HAZY (*)   ? Hgb urine dipstick SMALL (*)   ? Protein, ur 30 (*)   ? Nitrite POSITIVE (*)   ? Bacteria, UA RARE (*)   ? All other components within normal limits  ?RESP PANEL BY RT-PCR (FLU A&B, COVID) ARPGX2  ?CULTURE, BLOOD (ROUTINE X 2)  ?CULTURE, BLOOD (ROUTINE X 2)  ?URINE CULTURE  ?LACTIC ACID, PLASMA  ?PROTIME-INR  ?LACTIC ACID, PLASMA  ? ? ? ?EKG ? ?EKG read interpreted by me shows normal sinus rhythm rate of 97 slight left axis no acute  ST-T changes ? ? ?RADIOLOGY ? ?Last x-ray read by radiology reviewed by me shows possible bibasilar infiltrates worse on the right. ? ?CT read by radiology films reviewed by me shows some stones in the kidneys but no obstruction possible gastroenteritis some edema in the right lower quadrant mesentery but apparently not appendicitis. ? ?PROCEDURES: ? ?Critical Care performed:  ? ?Procedures ? ? ?MEDICATIONS ORDERED IN ED: ?Medications  ?acetaminophen (TYLENOL) 325 MG tablet (  Not Given 01/04/22 2103)  ?lactated ringers infusion (has no administration in time range)  ?vancomycin (VANCOCIN) IVPB 1000 mg/200 mL premix (1,000 mg Intravenous New Bag/Given 01/04/22 2223)  ?acetaminophen (OFIRMEV) IV 1,000 mg (1,000 mg Intravenous New Bag/Given 01/04/22 2230)  ?acetaminophen (TYLENOL) tablet 650 mg (650 mg Oral Given 01/04/22 1908)  ?lactated ringers bolus 1,000 mL (0 mLs Intravenous Stopped 01/04/22 2230)  ?  And  ?lactated ringers bolus 1,000 mL (0 mLs Intravenous Stopped 01/04/22 2230)  ?  And  ?lactated ringers bolus 1,000 mL (0 mLs Intravenous Stopped 01/04/22 2230)  ?ceFEPIme (MAXIPIME) 2 g in sodium  chloride 0.9 % 100 mL IVPB (0 g Intravenous Stopped 01/04/22 2126)  ?metroNIDAZOLE (FLAGYL) IVPB 500 mg (0 mg Intravenous Stopped 01/04/22 2126)  ?naloxone [facility]) injection 2 mg (2 mg Intravenous Given 01/04/22 2050)  ?iohexol (OMNIPAQUE) 300 MG/ML solution 100 mL (100 mLs Intravenous Contrast Given 01/04/22 2133)  ? ? ? ?IMPRESSION / MDM / ASSESSMENT AND PLAN / ED COURSE  ?I reviewed the triage vital signs and the nursing notes. ?No change at all in patient's grogginess with Narcan IV ?Patient appears to have some infection.  He has a high fever he has a focal source of pain but no obvious site of infection.  He does have some white cells in his urine but not many.  Rare bacteria positive nitrite.  Coronavirus and flu are negative.  White blood count is only 9.2 but there are 87% polys.  Lactic acid is negative comprehensive metabolic profile only shows a somewhat lower than usual sodium ?The patient is on the cardiac monitor to evaluate for evidence of arrhythmia and/or significant heart rate changes.  None have been seen ? ? ? ?FINAL CLINICAL IMPRESSION(S) / ED DIAGNOSES  ? ?Final diagnoses:  ?Fever, unspecified fever cause  ?Flank pain  ?Altered mental status, unspecified altered mental status type  ? ? ? ?Rx / DC Orders  ? ?ED Discharge Orders   ? ? None  ? ?  ? ? ? ?Note:  This document was prepared using Dragon voice recognition software and may include unintentional dictation errors. ?  ?Arnaldo Natal, MD ?01/04/22 2246 ? ?

## 2022-01-04 NOTE — ED Notes (Signed)
Pt to room 2 via w/c; assisted out of clothes and into hosp gown; card monitor applied; Dr Darnelle Catalan notified of pt and care nurse Pricilla Handler RN at bedside ?

## 2022-01-04 NOTE — ED Notes (Addendum)
Pt sleeping soundly in w/c in lobby; awakens when called by name but immed falls back asleep; pt denies taking any sedating products PTA but cont to fall asleep during questions and is difficult to arouse, smells of urine; temp is 103.2; acuity level changed and charge nurse called for exam room ?

## 2022-01-04 NOTE — ED Notes (Signed)
Bed alarm on.

## 2022-01-04 NOTE — Progress Notes (Signed)
Pharmacy Antibiotic Note ? ?Derek Dunn is a 50 y.o. male admitted on 01/04/2022 with sepsis from unknown source.  Pharmacy has been consulted for Cefepime and Vancomycin dosing x 7 days. ? ?Plan: ?Cefepime 2 gm q8h per indication & renal fxn. ? ?Pt given initial dose of Vancomycin 2500 mg. ?Vancomycin 1500 mg IV Q 12 hrs.  ?Goal AUC 400-550. ?Expected AUC: 464.1 ?SCr used: 1.01 ? ?Pharmacy will continue to follow and will adjust abx dosing whenever warranted. ? ? ?Height: 6\' 3"  (190.5 cm) ?Weight: 99.5 kg (219 lb 6.4 oz) ?IBW/kg (Calculated) : 84.5 ? ?Temp (24hrs), Avg:101.5 ?F (38.6 ?C), Min:98.1 ?F (36.7 ?C), Max:103.2 ?F (39.6 ?C) ? ?Recent Labs  ?Lab 01/04/22 ?2051  ?WBC 9.2  ?CREATININE 1.01  ?LATICACIDVEN 1.1  ?  ?Estimated Creatinine Clearance: 103.4 mL/min (by C-G formula based on SCr of 1.01 mg/dL).   ? ?No Known Allergies ? ?Antimicrobials this admission: ?5/03 Cefepime >> x 7 days ?5/03 Flagyl >> x 7 days ?5/03 Vancomycin >> 7 days ? ?Microbiology results: ?5/03 BCx: Pending ?5/03 UCx: Pending  ? ?Thank you for allowing pharmacy to be a part of this patient?s care. ? ?7/03, PharmD, MBA ?01/05/2022 ?4:55 AM ? ? ? ?

## 2022-01-04 NOTE — ED Triage Notes (Signed)
Pt BIB EMS for left hip pain that started 2 days ago. Per pt, the pain radiates down his leg. Pt denies cough, congestion, runny nose, or sore throat.  ?

## 2022-01-04 NOTE — Sepsis Progress Note (Signed)
Following fore sepsis monitoring ?

## 2022-01-04 NOTE — Consult Note (Signed)
PHARMACY -  BRIEF ANTIBIOTIC NOTE  ? ?Pharmacy has received consult(s) for Vancomycin and Cefepime from an ED provider.  The patient's profile has been reviewed for ht/wt/allergies/indication/available labs.   ? ?One time order(s) placed for Vancomycin 1g IV and Cefepime 2g IV x 1 dose each. ? ?Further antibiotics/pharmacy consults should be ordered by admitting physician if indicated.       ?                ?Thank you, ?Toua Stites A Karley Pho ?01/04/2022  8:53 PM ? ?

## 2022-01-04 NOTE — ED Triage Notes (Signed)
First Nurse Note:  Arrives via [facility] from [facility].  C/O left flank pain that radiates down left leg.  VS wnl. ?

## 2022-01-04 NOTE — Consult Note (Signed)
CODE SEPSIS - PHARMACY COMMUNICATION ? ?**Broad Spectrum Antibiotics should be administered within 1 hour of Sepsis diagnosis** ? ?Time Code Sepsis Called/Page Received: 2052 ? ?Antibiotics Ordered: Vanc, Cefepime, Flagyl ? ?Time of 1st antibiotic administration: 2054 ? ?Additional action taken by pharmacy: none ? ?If necessary, Name of Provider/Nurse Contacted: n/a ? ? ? ?Bettey Costa ,PharmD ?Clinical Pharmacist  ?01/04/2022  9:26 PM ? ?

## 2022-01-05 ENCOUNTER — Inpatient Hospital Stay (HOSPITAL_COMMUNITY)
Admit: 2022-01-05 | Discharge: 2022-01-05 | Disposition: A | Discharge status: Home / Self Care | Payer: Self-pay | Attending: Internal Medicine | Admitting: Internal Medicine

## 2022-01-05 DIAGNOSIS — Z720 Tobacco use: Secondary | ICD-10-CM

## 2022-01-05 DIAGNOSIS — M545 Low back pain, unspecified: Secondary | ICD-10-CM

## 2022-01-05 DIAGNOSIS — R7881 Bacteremia: Secondary | ICD-10-CM

## 2022-01-05 DIAGNOSIS — R509 Fever, unspecified: Secondary | ICD-10-CM

## 2022-01-05 DIAGNOSIS — B9561 Methicillin susceptible Staphylococcus aureus infection as the cause of diseases classified elsewhere: Secondary | ICD-10-CM

## 2022-01-05 DIAGNOSIS — Z5900 Homelessness unspecified: Secondary | ICD-10-CM

## 2022-01-05 DIAGNOSIS — F1910 Other psychoactive substance abuse, uncomplicated: Secondary | ICD-10-CM

## 2022-01-05 LAB — BLOOD CULTURE ID PANEL (REFLEXED) - BCID2

## 2022-01-05 LAB — ECHOCARDIOGRAM COMPLETE
Area-P 1/2: 3.3 cm2
Calc EF: 54.1 %
S' Lateral: 3.2 cm
Single Plane A2C EF: 53.8 %
Single Plane A4C EF: 54 %

## 2022-01-05 LAB — LACTIC ACID, PLASMA: Lactic Acid, Venous: 1.1 mmol/L (ref 0.5–1.9)

## 2022-01-05 LAB — PROCALCITONIN: Procalcitonin: 1.93 ng/mL

## 2022-01-05 LAB — PROTIME-INR
INR: 1.2 (ref 0.8–1.2)
Prothrombin Time: 15.5 seconds — ABNORMAL HIGH (ref 11.4–15.2)

## 2022-01-05 LAB — HIV ANTIBODY (ROUTINE TESTING W REFLEX): HIV Screen 4th Generation wRfx: NONREACTIVE

## 2022-01-05 LAB — MRSA NEXT GEN BY PCR, NASAL: MRSA by PCR Next Gen: NOT DETECTED

## 2022-01-05 LAB — CORTISOL-AM, BLOOD: Cortisol - AM: 26.4 ug/dL — ABNORMAL HIGH (ref 6.7–22.6)

## 2022-01-05 MED ORDER — SODIUM CHLORIDE 0.9 % IV SOLN
12.0000 g | INTRAVENOUS | Status: DC
  Administered 2022-01-05 – 2022-01-06 (×2): 12 g via INTRAVENOUS
  Filled 2022-01-05 (×2): qty 48

## 2022-01-05 MED ORDER — SODIUM CHLORIDE 0.9 % IV SOLN
2.0000 g | Freq: Once | INTRAVENOUS | Status: AC
  Administered 2022-01-05: 2 g via INTRAVENOUS
  Filled 2022-01-05: qty 8

## 2022-01-05 MED ORDER — SODIUM CHLORIDE 0.9 % IV SOLN: 12.0000 g | INTRAVENOUS | Status: DC

## 2022-01-05 MED ORDER — SODIUM CHLORIDE 0.9 % IV SOLN: 2.0000 g | Freq: Once | INTRAVENOUS | Status: DC

## 2022-01-05 MED ORDER — NICOTINE 14 MG/24HR TD PT24
14.0000 mg | MEDICATED_PATCH | Freq: Every day | TRANSDERMAL | Status: DC | PRN
Start: 2022-01-05 — End: 2022-01-07

## 2022-01-05 MED ORDER — KETOROLAC TROMETHAMINE 15 MG/ML IJ SOLN
15.0000 mg | Freq: Four times a day (QID) | INTRAMUSCULAR | Status: DC | PRN
  Administered 2022-01-06: 15 mg via INTRAVENOUS
  Filled 2022-01-05: qty 1

## 2022-01-05 NOTE — Hospital Course (Addendum)
Mr. Derek Dunn is a 51 year old male with history of homelessness, polysubstance abuse, nicotine abuse, hyperlipidemia, who presents emergency department for chief concerns of left lower extremity back pain with radiation to the left leg.  ID consulted: MSSA bacteremia.  Rule out discitis/vertebral osteomyelitis.  Will need MRI of the lumbar spine and thoracic spine.  Will need 2D echo.  And TEE if needed for ruling out endocarditis ?

## 2022-01-05 NOTE — H&P (Signed)
?History and Physical  ? ?Derek Dunn WJX:[id number] DOB: 1970-09-17 DOA: 01/04/2022 ? ?PCP: Pcp, No  ?Outpatient Specialists: None ?Patient coming from: [facility] via EMS ? ?I have personally briefly reviewed patient's old medical records in [facility] EMR. ? ?Chief Concern: Back pain ? ?HPI: Derek Dunn is a 51 year old male with history of homelessness, polysubstance abuse, nicotine abuse, hyperlipidemia, who presents emergency department for chief concerns of left lower extremity back pain with radiation to the left leg. ? ?Initial vitals in the emergency department showed temperature of 103.1, Tmax of 103.2, heart rate of 98 and increased to 101, respiration rate of 18, heart rate of 142/85, SPO2 of 98% on room air. ? ?Serum sodium is 130, potassium 3.7, chloride 98, bicarb 24, BUN of 16, serum creatinine of 1.01, GFR greater than 60, nonfasting blood glucose 137, WBC 9.2, hemoglobin 12.7, platelets 172. ? ?Lactic acid is 1.1.  COVID/influenza A/influenza B PCR were negative.  UA was positive for nitrites.  Blood cultures x2 are in process. ? ?ED treatment: Narcan 2 mg IV did not improve his symptoms.  Tylenol, sepsis bolus, vancomycin, metronidazole, cefepime. ? ?At bedside patient was able to tell me his name, his age, and he knows he is in the hospital and the current calendar year. ? ?He states he comes into the hospital because his left lower back is hurting, started 2 days ago.  He denies any trauma or bleeding.  He falls back to sleeping.  I asked him if the pressure and palpitation is making it worse, he states that me pressing on it makes it worse. Then he falls asleep. ? ?He endorses cough.  Then he falls asleep.  He does not answer when I ask him if it is productive. ? ?Social history: He is homeless.  He endorses tobacco use.  He denies EtOH and recreational drug use. ? ?ROS: Unable to fully complete as patient keeps falling asleep, he is arousable with sternal and loud verbal  stimuli ?Cardiovascular: no chest pain ?Respiratory: no cough ?Gastrointestinal: no nausea, no vomiting ?Genitourinary: no urinary incontinence, no dysuria, no hematuria ?Musculoskeletal: no arthralgias, + myalgias ? ?ED Course: Discussed with emergency medicine provider, patient requiring hospitalization for fever. ? ?Assessment/Plan ? ?Principal Problem: ?  Fever ?Active Problems: ?  Homeless ?  Tobacco use ?  Polysubstance abuse (HCC) ?  Left low back pain ?  ?Assessment and Plan: ? ?* Fever ?- With elevated heart rate, concerning for sepsis ?- Blood cultures x2 are in process ?- Patient may need CTA of the chest to assess for PE given fever and elevated heart rate however a CT abdomen and pelvis with contrast has already been done in the ED ?- I will defer CTA for PE pending day provider clinical judgment ?- Admit to telemetry medical, observe ? ?Left low back pain ?- Reproduced with palpation ?- Lidocaine patch ordered ? ?Polysubstance abuse (HCC) ?- History of polysubstance abuse, at bedside patient denies ? ?Tobacco use ?- Nicotine patch as needed ordered ? ?Pending med reconciliation  ?Chart reviewed.  ? ?DVT prophylaxis: Enoxaparin ?Code Status: full code ?Diet: N.p.o. ?Family Communication: No ?Disposition Plan: Pending clinical course ?Consults called: None at this time ?Admission status: Telemetry medical, observation ? ?Past Medical History:  ?Diagnosis Date  ? Hyperlipidemia   ? ?History reviewed. No pertinent surgical history. ? ?Social History:  reports that he has been smoking. He has never used smokeless tobacco. He reports current drug use. Drugs: IV and Methamphetamines. He reports that  he does not drink alcohol. ? ?No Known Allergies ?History reviewed. No pertinent family history. ?Family history: Family history reviewed and not pertinent ? ?Prior to Admission medications   ?Not on File  ? ?Physical Exam: ?Vitals:  ? 01/04/22 2100 01/04/22 2236 01/04/22 2245 01/04/22 2350  ?BP: 128/72  (!)  143/98   ?Pulse:  91 95   ?Resp: (!) 24 (!) 24 20   ?Temp:   98.1 ?F (36.7 ?C)   ?TempSrc:      ?SpO2:  100% 99%   ?Weight:    99.5 kg  ?Height:    6\' 3"  (1.905 m)  ? ?Constitutional: appears age appropriate, NAD, calm, comfortable ?Eyes: PERRL, lids and conjunctivae normal ?ENMT: Mucous membranes are moist. Posterior pharynx clear of any exudate or lesions. Age-appropriate dentition. Hearing appropriate ?Neck: normal, supple, no masses, no thyromegaly ?Respiratory: clear to auscultation bilaterally, no wheezing, no crackles. Normal respiratory effort. No accessory muscle use.  ?Cardiovascular: Regular rate and rhythm, no murmurs / rubs / gallops. No extremity edema. 2+ pedal pulses. No carotid bruits.  ?Abdomen: no tenderness, no masses palpated, no hepatosplenomegaly. Bowel sounds positive.  ?Musculoskeletal: no clubbing / cyanosis. No joint deformity upper and lower extremities. Good ROM, no contractures, no atrophy. Normal muscle tone.  ?Skin: no rashes, lesions, ulcers. No induration ?Neurologic: Sensation intact. Strength 5/5 in all 4.  ?Psychiatric: Normal judgment and insight. Alert and oriented x 3. Normal mood.  ? ?EKG: independently reviewed, showing sinus rhythm with rate of 97, qtc 462, lvh ? ?Chest x-ray on Admission: I personally reviewed and I agree with radiologist reading as below. ? ?CT ABDOMEN PELVIS W CONTRAST ? ?Result Date: 01/04/2022 ?CLINICAL DATA:  Left flank pain. EXAM: CT ABDOMEN AND PELVIS WITH CONTRAST TECHNIQUE: Multidetector CT imaging of the abdomen and pelvis was performed using the standard protocol following bolus administration of intravenous contrast. RADIATION DOSE REDUCTION: This exam was performed according to the departmental dose-optimization program which includes automated exposure control, adjustment of the mA and/or kV according to patient size and/or use of iterative reconstruction technique. CONTRAST:  100mL OMNIPAQUE IOHEXOL 300 MG/ML  SOLN COMPARISON:  None Available.  FINDINGS: Factors affecting image quality: There is motion artifact particularly in the pelvis. Lower chest: No acute abnormality. The cardiac size normal. There is no pericardial effusion. There is calcification in the distal LAD coronary artery. There are no lung base infiltrates. Small hiatal hernia. Hepatobiliary: 21 cm length mildly steatotic liver. No mass enhancement. Unremarkable bile ducts and contracted gallbladder. Pancreas: No focal abnormality. Spleen: Enlarged measuring 16 cm in length with no mass enhancement. Engorged splenic vein. Adrenals/Urinary Tract: There is no adrenal mass, no focal abnormality in the left renal cortex. There is a single too small to characterize hypodensity in the posterior aspect of the right kidney. Stones are noted measuring 2 mm and 3 mm, nonobstructing, in the left lower pole collecting system and there is a punctate nonobstructive caliceal stone in the right upper pole and in the right lower pole, but there is no obstructing urinary stone or hydronephrosis and no intravesical stone or bladder thickening allowing for pelvic motion artifact. There are bilateral extrarenal pelves. Stomach/Bowel: Small hiatal hernia. There are mild thickened folds in the stomach and left upper to mid abdominal small bowel. There are no dilated small bowel segments or Intestinal inflammatory changes. The appendix is not well seen due to pelvic motion artifact but is believed to be normal in caliber. There is moderate retained stool without evidence of  focal diverticulitis or proctocolitis. Vascular/Lymphatic: There is moderate aortoiliac calcific plaque without aneurysm. The portal vein is prominent measuring 2 cm. There is circumaortic left renal vein. No enlarged lymph nodes are seen. Reproductive: There is no prostatomegaly. Other: There is trace ascites in the posterior deep pelvis. There is mild nonspecific edema in the right lower quadrant mesentery. There is no free air, pneumatosis  or free hemorrhage. There are right-greater-than-left inguinal fat hernias with small lymph nodes in the right inguinal hernia and tiny umbilical fat hernia. No incarcerated hernia is seen. Musculoskeletal: There are

## 2022-01-05 NOTE — Progress Notes (Signed)
The patient is a 51 yr old man who presented to [facility] ED on 01/04/2022 with complaints of back pain with radiation to his left leg. He had fevers in the ED with elevated heart rate. Urinalysis appears positive for UTI. Blood cultures x 2 are positive for MSSA.  ? ?The patient has been continued on IV Nafcillin. Infectious disease has been consulted. Echocardiogram has been ordered. ? ?The patient is awake, alert, and oriented x 3. He continues to complain bitterly of back pain. ? ?Vitals and lab work have been reviewed. Vitals are within normal limits. Cortisol is elevated at 26.4 this am. Urinalysis is positive for UTI. Urine culture is pending.  ? ?He has been admitted early this morning by my colleague, Dr. Tobie Poet. ? ?The patient is awake, alert, and oriented x 3. He is in moderate distress from back pain. Heart and lung exams have been ordered. Abdomen is soft, non-tender, non-distended. Positive bowel sounds. Extremities are negative for cyanosis, clubbing or edema. The patient is moving all extremities. ? ?Cefepime has been stopped. The patient is on nafcillin IV daily. Monitor., ? ? ? ?

## 2022-01-05 NOTE — Assessment & Plan Note (Signed)
-   Nicotine patch as needed ordered ?

## 2022-01-05 NOTE — Assessment & Plan Note (Addendum)
-   History of polysubstance abuse, at bedside patient denies ?-patient told ID that he was on suboxone-- order for order set placed ?

## 2022-01-05 NOTE — Consult Note (Signed)
NAME: Derek Dunn  ?DOB: Aug 25, 1971  ?MRN: [id number]  ?Date/Time: 01/05/2022 6:46 PM ? ?REQUESTING PROVIDER: Dr. Gerri LinsSwayze ?Subjective:  ?REASON FOR CONSULT: Staph aureus bacteremia ?History obtained from patient.  Chart reviewed thoroughly. ?Derek Dunn is a 51 y.o. male with a history of polysubstance use, homelessness rate presents with  left flank pain rating to the buttock and the back of the leg for 3 days duration.  Patient states he is homeless and sleeps outside. ?He is an IV drug user and has done heroin and crystal meth past week.  He has had it for 2 days- on arrival he was very groggy ? ?Vitals in the ED temp 103.1, BP 142/85, pulse 98 and sats 98%. Labs NA 130, Glucose 137, cr 1.01 ?WBC 9.2 , HB 12.7 and plt 172 ?CT scan showed left renal stone but no obstruction ?Blood culture sent and was started on IV vanco, metronidazole and cefepime.. I am asked to see him as his blood culture is MSSA ?Patient states he midlines in his forearm and antecubital veins.  He has not had infective endocarditis or abscesses before ?He states he had hepatitis C and and it is gone without medications. ? ?Past Medical History:  ?Diagnosis Date  ? Hyperlipidemia   ?  ?Past surgical history none ?Social History  ? ?Socioeconomic History  ? Marital status: Married  ?  Spouse name: Not on file  ? Number of children: Not on file  ? Years of education: Not on file  ? Highest education level: Not on file  ?Occupational History  ? Not on file  ?Tobacco Use  ? Smoking status: Every Day  ? Smokeless tobacco: Never  ?Vaping Use  ? Vaping Use: Never used  ?Substance and Sexual Activity  ? Alcohol use: No  ? Drug use: Yes  ?  Types: IV, Methamphetamines  ?  Comment: suboxone  ? Sexual activity: Not on file  ?Other Topics Concern  ? Not on file  ?Social History Narrative  ? Not on file  ? ?Social history homelessness ?  ?History reviewed. No pertinent family history. ?No Known Allergies ?I? ?Current Facility-Administered Medications   ?Medication Dose Route Frequency Provider Last Rate Last Admin  ? acetaminophen (OFIRMEV) IV 1,000 mg  1,000 mg Intravenous Q6H Swayze, Ava, DO 400 mL/hr at 01/05/22 1409 1,000 mg at 01/05/22 1409  ? acetaminophen (TYLENOL) tablet 650 mg  650 mg Oral Q6H PRN Swayze, Ava, DO      ? Or  ? acetaminophen (TYLENOL) suppository 650 mg  650 mg Rectal Q6H PRN Swayze, Ava, DO      ? enoxaparin (LOVENOX) injection 40 mg  40 mg Subcutaneous Q24H Swayze, Ava, DO      ? ketorolac (TORADOL) 15 MG/ML injection 15 mg  15 mg Intravenous Q6H PRN Swayze, Ava, DO      ? nafcillin 12 g in sodium chloride 0.9 % 500 mL continuous infusion  12 g Intravenous Q24H Swayze, Ava, DO 20.8 mL/hr at 01/05/22 1357 12 g at 01/05/22 1357  ? nicotine (NICODERM CQ - dosed in mg/24 hours) patch 14 mg  14 mg Transdermal Daily PRN Swayze, Ava, DO      ? ondansetron (ZOFRAN) tablet 4 mg  4 mg Oral Q6H PRN Swayze, Ava, DO      ? Or  ? ondansetron (ZOFRAN) injection 4 mg  4 mg Intravenous Q6H PRN Swayze, Ava, DO      ?  ? ?Abtx:  ?Anti-infectives (From admission, onward)  ? ?  Start     Dose/Rate Route Frequency Ordered Stop  ? 01/06/22 1100  nafcillin 12 g in sodium chloride 0.9 % 500 mL continuous infusion  Status:  Discontinued       ?See Hyperspace for full Linked Orders Report.  ? 12 g ?20.8 mL/hr over 24 Hours Intravenous Every 24 hours 01/05/22 0924 01/05/22 0936  ? 01/05/22 1130  nafcillin 12 g in sodium chloride 0.9 % 500 mL continuous infusion       ? 12 g ?20.8 mL/hr over 24 Hours Intravenous Every 24 hours 01/05/22 0940    ? 01/05/22 1100  vancomycin (VANCOREADY) IVPB 1500 mg/300 mL  Status:  Discontinued       ? 1,500 mg ?150 mL/hr over 120 Minutes Intravenous Every 12 hours 01/04/22 2357 01/05/22 0924  ? 01/05/22 1100  nafcillin 12 g in sodium chloride 0.9 % 500 mL continuous infusion  Status:  Discontinued       ?See Hyperspace for full Linked Orders Report.  ? 12 g ?20.8 mL/hr over 24 Hours Intravenous Every 24 hours 01/05/22 0936 01/05/22  0940  ? 01/05/22 1100  nafcillin 2 g in sodium chloride 0.9 % 100 mL IVPB       ? 2 g ?200 mL/hr over 30 Minutes Intravenous  Once 01/05/22 0940 01/05/22 1139  ? 01/05/22 1030  nafcillin 2 g in sodium chloride 0.9 % 100 mL IVPB  Status:  Discontinued       ?See Hyperspace for full Linked Orders Report.  ? 2 g ?200 mL/hr over 30 Minutes Intravenous  Once 01/05/22 0924 01/05/22 0936  ? 01/05/22 1030  nafcillin 2 g in sodium chloride 0.9 % 100 mL IVPB  Status:  Discontinued       ?See Hyperspace for full Linked Orders Report.  ? 2 g ?200 mL/hr over 30 Minutes Intravenous  Once 01/05/22 0936 01/05/22 0940  ? 01/05/22 0900  metroNIDAZOLE (FLAGYL) IVPB 500 mg  Status:  Discontinued       ? 500 mg ?100 mL/hr over 60 Minutes Intravenous Every 12 hours 01/04/22 2311 01/05/22 0924  ? 01/05/22 0500  ceFEPIme (MAXIPIME) 2 g in sodium chloride 0.9 % 100 mL IVPB  Status:  Discontinued       ? 2 g ?200 mL/hr over 30 Minutes Intravenous Every 8 hours 01/04/22 2355 01/05/22 0924  ? 01/05/22 0000  vancomycin (VANCOREADY) IVPB 1500 mg/300 mL       ? 1,500 mg ?150 mL/hr over 120 Minutes Intravenous  Once 01/04/22 2355 01/05/22 0308  ? 01/04/22 2315  metroNIDAZOLE (FLAGYL) IVPB 500 mg  Status:  Discontinued       ? 500 mg ?100 mL/hr over 60 Minutes Intravenous Every 12 hours 01/04/22 2310 01/04/22 2311  ? 01/04/22 2100  ceFEPIme (MAXIPIME) 2 g in sodium chloride 0.9 % 100 mL IVPB       ? 2 g ?200 mL/hr over 30 Minutes Intravenous  Once 01/04/22 2047 01/04/22 2126  ? 01/04/22 2100  metroNIDAZOLE (FLAGYL) IVPB 500 mg       ? 500 mg ?100 mL/hr over 60 Minutes Intravenous  Once 01/04/22 2047 01/04/22 2126  ? 01/04/22 2100  vancomycin (VANCOCIN) IVPB 1000 mg/200 mL premix       ? 1,000 mg ?200 mL/hr over 60 Minutes Intravenous  Once 01/04/22 2047 01/04/22 2323  ? ?  ? ? ?REVIEW OF SYSTEMS:  ?Const:  fever, negative chills, negative weight loss ?Eyes: negative diplopia or visual changes, negative eye pain ?  ENT: negative coryza, negative sore  throat ?Resp: + cough, hemoptysis, no dyspnea ?Cards: negative for chest pain, palpitations, lower extremity edema ?GU: negative for frequency, dysuria and hematuria ?GI: Negative for abdominal pain, diarrhea, bleeding, constipation ?Skin: negative for rash and pruritus ?Heme: negative for easy bruising and gum/nose bleeding ?MS: Left back pain and leg pain.  Generalized weakness ?Neurolo:negative for headaches, dizziness, vertigo, memory problems  ?Psych: depression  ?Endocrine: negative for thyroid, diabetes ?Allergy/Immunology- negative for any medication or food allergies ? ?Objective:  ?VITALS:  ?BP 134/79   Pulse 87   Temp 98.9 ?F (37.2 ?C) (Oral)   Resp 18   Ht 6\' 3"  (1.905 m)   Wt 99.5 kg   SpO2 99%   BMI 27.42 kg/m?  ?PHYSICAL EXAM:  ?General: Alert, cooperative, no distress at rest but on moving he is quite pain, appears stated age.  ?Head: Normocephalic, without obvious abnormality, atraumatic. ?Eyes: Conjunctivae clear, anicteric sclerae. Pupils are equal ?ENT Nares normal. No drainage or sinus tenderness. ?Lips, mucosa, and tongue normal. No Thrush ?Neck: Supple, symmetrical, no adenopathy, thyroid: non tender ?no carotid bruit and no JVD. ?Back: No CVA tenderness. ?Lungs: Clear to auscultation bilaterally. No Wheezing or Rhonchi. No rales. ?Heart: Regular rate and rhythm, no murmur, rub or gallop. ?Abdomen: Soft, non-tender,not distended. Bowel sounds normal. No masses ?Extremities: atraumatic, no cyanosis. No edema. No clubbing ?Skin: No rashes or lesions. Or bruising ?Lymph: Cervical, supraclavicular normal. ?Neurologic: Grossly non-focal ?Pertinent Labs ?Lab Results ?CBC ?   ?Component Value Date/Time  ? WBC 9.2 01/04/2022 2051  ? RBC 4.73 01/04/2022 2051  ? HGB 12.7 (L) 01/04/2022 2051  ? HGB 15.4 03/02/2012 2036  ? HCT 38.5 (L) 01/04/2022 2051  ? HCT 44.9 03/02/2012 2036  ? PLT 172 01/04/2022 2051  ? PLT 181 03/02/2012 2036  ? MCV 81.4 01/04/2022 2051  ? MCV 85 03/02/2012 2036  ? MCH 26.8  01/04/2022 2051  ? MCHC 33.0 01/04/2022 2051  ? RDW 14.3 01/04/2022 2051  ? RDW 13.8 03/02/2012 2036  ? LYMPHSABS 0.7 01/04/2022 2051  ? MONOABS 0.5 01/04/2022 2051  ? EOSABS 0.0 01/04/2022 2051  ? BASOSABS 0.0

## 2022-01-05 NOTE — Progress Notes (Signed)
PHARMACY - PHYSICIAN COMMUNICATION ?CRITICAL VALUE ALERT - BLOOD CULTURE IDENTIFICATION (BCID) ? ?[name] is an 51 y.o. male who presented to [facility] on 01/04/2022 with a chief complaint of back/hip pain ? ?Assessment:  blood culture GPC 2/2 sets, BCID MSSA.  Previous IV drug use ? ?Name of physician (or Provider) Contacted: Drs Rivka Safer and Swayze ? ?Current antibiotics: vanco/cefepime/metronidazole ? ?Changes to prescribed antibiotics recommended:  ?Recommendations accepted by provider ? ?Results for orders placed or performed during the hospital encounter of 01/04/22  ?Blood Culture ID Panel (Reflexed) (Collected: 01/04/2022  8:51 PM)  ?Result Value Ref Range  ? Enterococcus faecalis NOT DETECTED NOT DETECTED  ? Enterococcus Faecium NOT DETECTED NOT DETECTED  ? Listeria monocytogenes NOT DETECTED NOT DETECTED  ? Staphylococcus species DETECTED (A) NOT DETECTED  ? Staphylococcus aureus (BCID) DETECTED (A) NOT DETECTED  ? Staphylococcus epidermidis NOT DETECTED NOT DETECTED  ? Staphylococcus lugdunensis NOT DETECTED NOT DETECTED  ? Streptococcus species NOT DETECTED NOT DETECTED  ? Streptococcus agalactiae NOT DETECTED NOT DETECTED  ? Streptococcus pneumoniae NOT DETECTED NOT DETECTED  ? Streptococcus pyogenes NOT DETECTED NOT DETECTED  ? A.calcoaceticus-baumannii NOT DETECTED NOT DETECTED  ? Bacteroides fragilis NOT DETECTED NOT DETECTED  ? Enterobacterales NOT DETECTED NOT DETECTED  ? Enterobacter cloacae complex NOT DETECTED NOT DETECTED  ? Escherichia coli NOT DETECTED NOT DETECTED  ? Klebsiella aerogenes NOT DETECTED NOT DETECTED  ? Klebsiella oxytoca NOT DETECTED NOT DETECTED  ? Klebsiella pneumoniae NOT DETECTED NOT DETECTED  ? Proteus species NOT DETECTED NOT DETECTED  ? Salmonella species NOT DETECTED NOT DETECTED  ? Serratia marcescens NOT DETECTED NOT DETECTED  ? Haemophilus influenzae NOT DETECTED NOT DETECTED  ? Neisseria meningitidis NOT DETECTED NOT DETECTED  ? Pseudomonas aeruginosa NOT  DETECTED NOT DETECTED  ? Stenotrophomonas maltophilia NOT DETECTED NOT DETECTED  ? Candida albicans NOT DETECTED NOT DETECTED  ? Candida auris NOT DETECTED NOT DETECTED  ? Candida glabrata NOT DETECTED NOT DETECTED  ? Candida krusei NOT DETECTED NOT DETECTED  ? Candida parapsilosis NOT DETECTED NOT DETECTED  ? Candida tropicalis NOT DETECTED NOT DETECTED  ? Cryptococcus neoformans/gattii NOT DETECTED NOT DETECTED  ? Meth resistant mecA/C and MREJ NOT DETECTED NOT DETECTED  ? ?Juliette Alcide, PharmD, BCPS, BCIDP ?Work Cell: [phone] ?01/05/2022 9:27 AM ? ? ? ?

## 2022-01-05 NOTE — Assessment & Plan Note (Addendum)
-   Reproduced with palpation ?- suboxone ?

## 2022-01-05 NOTE — Assessment & Plan Note (Deleted)
-   With elevated heart rate, concerning for sepsis ?- Blood cultures x2 are in process ?- Patient may need CTA of the chest to assess for PE given fever and elevated heart rate however a CT abdomen and pelvis with contrast has already been done in the ED ?- I will defer CTA for PE pending day provider clinical judgment ?- Admit to telemetry medical, observe ?

## 2022-01-06 ENCOUNTER — Inpatient Hospital Stay: Payer: Self-pay

## 2022-01-06 DIAGNOSIS — M0008 Staphylococcal arthritis, vertebrae: Secondary | ICD-10-CM

## 2022-01-06 LAB — CBC WITH DIFFERENTIAL/PLATELET
Abs Immature Granulocytes: 0.03 10*3/uL (ref 0.00–0.07)
Basophils Absolute: 0 10*3/uL (ref 0.0–0.1)
Basophils Relative: 0 %
Eosinophils Absolute: 0 10*3/uL (ref 0.0–0.5)
Eosinophils Relative: 0 %
HCT: 35.1 % — ABNORMAL LOW (ref 39.0–52.0)
Hemoglobin: 11.4 g/dL — ABNORMAL LOW (ref 13.0–17.0)
Immature Granulocytes: 1 %
Lymphocytes Relative: 9 %
Lymphs Abs: 0.5 10*3/uL — ABNORMAL LOW (ref 0.7–4.0)
MCH: 26 pg (ref 26.0–34.0)
MCHC: 32.5 g/dL (ref 30.0–36.0)
MCV: 80.1 fL (ref 80.0–100.0)
Monocytes Absolute: 0.3 10*3/uL (ref 0.1–1.0)
Monocytes Relative: 6 %
Neutro Abs: 4.6 10*3/uL (ref 1.7–7.7)
Neutrophils Relative %: 84 %
Platelets: 160 10*3/uL (ref 150–400)
RBC: 4.38 MIL/uL (ref 4.22–5.81)
RDW: 13.6 % (ref 11.5–15.5)
WBC: 5.4 10*3/uL (ref 4.0–10.5)
nRBC: 0 % (ref 0.0–0.2)

## 2022-01-06 LAB — COMPREHENSIVE METABOLIC PANEL
ALT: 9 U/L (ref 0–44)
AST: 14 U/L — ABNORMAL LOW (ref 15–41)
Albumin: 3 g/dL — ABNORMAL LOW (ref 3.5–5.0)
Alkaline Phosphatase: 70 U/L (ref 38–126)
Anion gap: 11 (ref 5–15)
BUN: 15 mg/dL (ref 6–20)
CO2: 23 mmol/L (ref 22–32)
Calcium: 8.6 mg/dL — ABNORMAL LOW (ref 8.9–10.3)
Chloride: 101 mmol/L (ref 98–111)
Creatinine, Ser: 0.71 mg/dL (ref 0.61–1.24)
GFR, Estimated: >60 mL/min (ref >60)
Glucose, Bld: 104 mg/dL — ABNORMAL HIGH (ref 70–99)
Potassium: 3.5 mmol/L (ref 3.5–5.1)
Sodium: 135 mmol/L (ref 135–145)
Total Bilirubin: 1.1 mg/dL (ref 0.3–1.2)
Total Protein: 6.3 g/dL — ABNORMAL LOW (ref 6.5–8.1)

## 2022-01-06 LAB — HEPATITIS C ANTIBODY: HCV Ab: REACTIVE — AB

## 2022-01-06 MED ORDER — TRAZODONE HCL 50 MG PO TABS: 50.0000 mg | ORAL_TABLET | Freq: Every evening | ORAL | Status: DC | PRN

## 2022-01-06 MED ORDER — BUPRENORPHINE HCL-NALOXONE HCL 2-0.5 MG SL SUBL
1.0000 | SUBLINGUAL_TABLET | SUBLINGUAL | Status: DC | PRN
  Administered 2022-01-06: 1 via SUBLINGUAL
  Filled 2022-01-06: qty 1

## 2022-01-06 MED ORDER — BUPRENORPHINE HCL-NALOXONE HCL 8-2 MG SL SUBL: 1.0000 | SUBLINGUAL_TABLET | Freq: Two times a day (BID) | SUBLINGUAL | Status: DC

## 2022-01-06 MED ORDER — GADOBUTROL 1 MMOL/ML IV SOLN
10.0000 mL | Freq: Once | INTRAVENOUS | Status: AC | PRN
  Administered 2022-01-06: 10 mL via INTRAVENOUS
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
## 2022-01-06 NOTE — Assessment & Plan Note (Signed)
TOC consult 

## 2022-01-06 NOTE — Progress Notes (Signed)
?PROGRESS NOTE ? ? ? ?Jaxxon Naeem  KGM:[id number] DOB: 1971/02/08 DOA: 01/04/2022 ?PCP: Pcp, No  ? ? ?Brief Narrative:  ?Mr. Aydyn Testerman is a 51 year old male with history of homelessness, polysubstance abuse, nicotine abuse, hyperlipidemia, who presents emergency department for chief concerns of left lower extremity back pain with radiation to the left leg.  ID consulted: MSSA bacteremia.  Rule out discitis/vertebral osteomyelitis.  Will need MRI of the lumbar spine and thoracic spine.  Will need 2D echo.  And TEE if needed for ruling out endocarditis  ? ? ?Assessment and Plan: ?Bacteremia due to Staphylococcus aureus ?-ID consult ?-Rule out discitis/vertebral osteomyelitis.  Will need MRI of the lumbar spine and thoracic spine.  Will need 2D echo.  And TEE if needed for ruling out endocarditis ?Discontinue Vanco cefepime and metronidazole and start nafcillin 2 g IV every 4 which can be given as a continuous infusion. ? ?Left low back pain ?- Reproduced with palpation ?- suboxone ? ?Polysubstance abuse (HCC) ?- History of polysubstance abuse, at bedside patient denies ?-patient told ID that he was on suboxone-- order for order set placed ? ?Tobacco use ?- Nicotine patch as needed ordered ? ?Homeless ?-TOC consult ? ? ? ? ? ? ? ? ? ?DVT prophylaxis: enoxaparin (LOVENOX) injection 40 mg Start: 01/05/22 0800 ?Place TED hose Start: 01/04/22 2308 ? ?  Code Status: Full Code ?Family Communication:  ? ?Disposition Plan:  ?Level of care: Med-Surg ?Status is: Inpatient ?Remains inpatient appropriate because: needs IV Abx ?  ? ?Consultants:  ?ID ? ? ?Subjective: ?C/o pain ? ?Objective: ?Vitals:  ? 01/05/22 1345 01/05/22 2006 01/06/22 0354 01/06/22 6440  ?BP: 134/79 (!) 144/87 124/84 (!) 132/91  ?Pulse: 87 81  75  ?Resp: 18 16 18    ?Temp: 98.9 ?F (37.2 ?C) 99.6 ?F (37.6 ?C) 99.2 ?F (37.3 ?C) (!) 97.4 ?F (36.3 ?C)  ?TempSrc: Oral Oral Oral Oral  ?SpO2: 99% 100% 97% 96%  ?Weight:      ?Height:      ? ? ?Intake/Output  Summary (Last 24 hours) at 01/06/2022 1041 ?Last data filed at 01/06/2022 03/08/2022 ?Gross per 24 hour  ?Intake 134.44 ml  ?Output 300 ml  ?Net -165.56 ml  ? ?Filed Weights  ? 01/04/22 2350  ?Weight: 99.5 kg  ? ? ?Examination: ? ? ?General: Appearance:     ?Overweight male in no acute distress  ?   ?Lungs:     respirations unlabored  ?Heart:    Normal heart rate.   ?  ?MS:   All extremities are intact.  ?  ?Neurologic:   Awake, alert, oriented x 3. No apparent focal neurological           defect.   ?  ? ? ? ?Data Reviewed: I have personally reviewed following labs and imaging studies ? ?CBC: ?Recent Labs  ?Lab 01/04/22 ?2051 01/06/22 ?03/08/22  ?WBC 9.2 5.4  ?NEUTROABS 7.9* 4.6  ?HGB 12.7* 11.4*  ?HCT 38.5* 35.1*  ?MCV 81.4 80.1  ?PLT 172 160  ? ?Basic Metabolic Panel: ?Recent Labs  ?Lab 01/04/22 ?2051 01/06/22 ?03/08/22  ?NA 130* 135  ?K 3.7 3.5  ?CL 98 101  ?CO2 24 23  ?GLUCOSE 137* 104*  ?BUN 16 15  ?CREATININE 1.01 0.71  ?CALCIUM 8.9 8.6*  ? ?GFR: ?Estimated Creatinine Clearance: 130.6 mL/min (by C-G formula based on SCr of 0.71 mg/dL). ?Liver Function Tests: ?Recent Labs  ?Lab 01/04/22 ?2051 01/06/22 ?03/08/22  ?AST 16 14*  ?ALT 13 9  ?ALKPHOS  77 70  ?BILITOT 1.2 1.1  ?PROT 7.4 6.3*  ?ALBUMIN 3.9 3.0*  ? ?No results for input(s): LIPASE, AMYLASE in the last 168 hours. ?No results for input(s): AMMONIA in the last 168 hours. ?Coagulation Profile: ?Recent Labs  ?Lab 01/04/22 ?2051 01/05/22 ?[id number]  ?INR 1.2 1.2  ? ?Cardiac Enzymes: ?No results for input(s): CKTOTAL, CKMB, CKMBINDEX, TROPONINI in the last 168 hours. ?BNP (last 3 results) ?No results for input(s): PROBNP in the last 8760 hours. ?HbA1C: ?No results for input(s): HGBA1C in the last 72 hours. ?CBG: ?No results for input(s): GLUCAP in the last 168 hours. ?Lipid Profile: ?No results for input(s): CHOL, HDL, LDLCALC, TRIG, CHOLHDL, LDLDIRECT in the last 72 hours. ?Thyroid Function Tests: ?No results for input(s): TSH, T4TOTAL, FREET4, T3FREE, THYROIDAB in the last 72  hours. ?Anemia Panel: ?No results for input(s): VITAMINB12, FOLATE, FERRITIN, TIBC, IRON, RETICCTPCT in the last 72 hours. ?Sepsis Labs: ?Recent Labs  ?Lab 01/04/22 ?2051 01/05/22 ?0106  ?PROCALCITON 1.93  --   ?LATICACIDVEN 1.1 1.1  ? ? ?Recent Results (from the past 240 hour(s))  ?Resp Panel by RT-PCR (Flu A&B, Covid) Nasopharyngeal Swab     Status: None  ? Collection Time: 01/04/22  7:04 PM  ? Specimen: Nasopharyngeal Swab; Nasopharyngeal(NP) swabs in vial transport medium  ?Result Value Ref Range Status  ? SARS Coronavirus 2 by RT PCR NEGATIVE NEGATIVE Final  ?  Comment: (NOTE) ?SARS-CoV-2 target nucleic acids are NOT DETECTED. ? ?The SARS-CoV-2 RNA is generally detectable in upper respiratory ?specimens during the acute phase of infection. The lowest ?concentration of SARS-CoV-2 viral copies this assay can detect is ?138 copies/mL. A negative result does not preclude SARS-Cov-2 ?infection and should not be used as the sole basis for treatment or ?other patient management decisions. A negative result may occur with  ?improper specimen collection/handling, submission of specimen other ?than nasopharyngeal swab, presence of viral mutation(s) within the ?areas targeted by this assay, and inadequate number of viral ?copies(<138 copies/mL). A negative result must be combined with ?clinical observations, patient history, and epidemiological ?information. The expected result is Negative. ? ?Fact Sheet for Patients:  ?BloggerCourse.comhttps://www.fda.gov/media/152166/download ? ?Fact Sheet for Healthcare Providers:  ?SeriousBroker.ithttps://www.fda.gov/media/152162/download ? ?This test is no t yet approved or cleared by the [place] FDA and  ?has been authorized for detection and/or diagnosis of SARS-CoV-2 by ?FDA under an Emergency Use Authorization (EUA). This EUA will remain  ?in effect (meaning this test can be used) for the duration of the ?COVID-19 declaration under Section 564(b)(1) of the Act, 21 ?U.S.C.section 360bbb-3(b)(1), unless the  authorization is terminated  ?or revoked sooner.  ? ? ?  ? Influenza A by PCR NEGATIVE NEGATIVE Final  ? Influenza B by PCR NEGATIVE NEGATIVE Final  ?  Comment: (NOTE) ?The Xpert Xpress SARS-CoV-2/FLU/RSV plus assay is intended as an aid ?in the diagnosis of influenza from Nasopharyngeal swab specimens and ?should not be used as a sole basis for treatment. Nasal washings and ?aspirates are unacceptable for Xpert Xpress SARS-CoV-2/FLU/RSV ?testing. ? ?Fact Sheet for Patients: ?BloggerCourse.comhttps://www.fda.gov/media/152166/download ? ?Fact Sheet for Healthcare Providers: ?SeriousBroker.ithttps://www.fda.gov/media/152162/download ? ?This test is not yet approved or cleared by the [place] FDA and ?has been authorized for detection and/or diagnosis of SARS-CoV-2 by ?FDA under an Emergency Use Authorization (EUA). This EUA will remain ?in effect (meaning this test can be used) for the duration of the ?COVID-19 declaration under Section 564(b)(1) of the Act, 21 U.S.C. ?section 360bbb-3(b)(1), unless the authorization is terminated or ?revoked. ? ?Performed at  [facility], [address] [place], ?[place] [address] ?  ?Blood Culture (routine x 2)     Status: Abnormal (Preliminary result)  ? Collection Time: 01/04/22  8:51 PM  ? Specimen: BLOOD  ?Result Value Ref Range Status  ? Specimen Description   Final  ?  BLOOD LEFT ANTECUBITAL ?Performed at [facility], [address], [place], [place] [address] ?  ? Special Requests   Final  ?  BOTTLES DRAWN AEROBIC AND ANAEROBIC Blood Culture adequate volume ?Performed at [facility], [address] [place], [place] [address] ?  ? Culture  Setup Time   Final  ?  GRAM POSITIVE COCCI ?IN BOTH AEROBIC AND ANAEROBIC BOTTLES ?Organism ID to follow ?CRITICAL RESULT CALLED TO, READ BACK BY AND VERIFIED WITHOzella Almond [facility] 1324 01/05/22 HNM ?Performed at [facility], [address], [place], [place] [address] ?  ? Culture (A)  Final  ?  STAPHYLOCOCCUS  AUREUS ?SUSCEPTIBILITIES TO FOLLOW ?Performed at [facility], [address] [place], [place] [address] ?  ? Report Status PENDING  Incomplete  ?Blood Culture (routine x 2)     Status: Abnormal (Preliminary result)

## 2022-01-06 NOTE — Consult Note (Signed)
? ?Date of Admission:  01/04/2022    ?ID: Derek Dunn is a 51 y.o. male  ?Active Problems: ?  Homeless ?  Tobacco use ?  Polysubstance abuse (HCC) ?  Left low back pain ?  Bacteremia due to Staphylococcus aureus ? ? ? ?Subjective: ?Has back pain and pain down his leg ? ?Medications:  ? [START ON 01/07/2022] buprenorphine-naloxone  1 tablet Sublingual BID  ? enoxaparin (LOVENOX) injection  40 mg Subcutaneous Q24H  ? ? ?Objective: ?Vital signs in last 24 hours: ?Temp:  [97.4 ?F (36.3 ?C)-99.6 ?F (37.6 ?C)] 97.4 ?F (36.3 ?C) (05/05 1610) ?Pulse Rate:  [75-87] 75 (05/05 0738) ?Resp:  [16-18] 18 (05/05 0354) ?BP: (124-144)/(79-91) 132/91 (05/05 9604) ?SpO2:  [96 %-100 %] 96 % (05/05 0738) ? ?PHYSICAL EXAM:  ?General: Alert, cooperative, no distress, appears stated age.  ?Head: Normocephalic, without obvious abnormality, atraumatic. ?Eyes: Conjunctivae clear, anicteric sclerae. Pupils are equal ?ENT Nares normal. No drainage or sinus tenderness. ?Lips, mucosa, and tongue normal. No Thrush ?Neck: Supple, symmetrical, no adenopathy, thyroid: non tender ?no carotid bruit and no JVD. ?Back: No CVA tenderness. ?Lungs: Clear to auscultation bilaterally. No Wheezing or Rhonchi. No rales. ?Heart: Regular rate and rhythm, no murmur, rub or gallop. ?Abdomen: Soft, non-tender,not distended. Bowel sounds normal. No masses ?Extremities: atraumatic, no cyanosis. No edema. No clubbing ?Skin: No rashes or lesions. Or bruising ?Lymph: Cervical, supraclavicular normal. ?Neurologic: Grossly non-focal ? ?Lab Results ?Recent Labs  ?  01/04/22 ?2051 01/06/22 ?5409  ?WBC 9.2 5.4  ?HGB 12.7* 11.4*  ?HCT 38.5* 35.1*  ?NA 130* 135  ?K 3.7 3.5  ?CL 98 101  ?CO2 24 23  ?BUN 16 15  ?CREATININE 1.01 0.71  ? ?Liver Panel ?Recent Labs  ?  01/04/22 ?2051 01/06/22 ?8119  ?PROT 7.4 6.3*  ?ALBUMIN 3.9 3.0*  ?AST 16 14*  ?ALT 13 9  ?ALKPHOS 77 70  ?BILITOT 1.2 1.1  ? ?Sedimentation Rate ?No results for input(s): ESRSEDRATE in the last 72 hours. ?C-Reactive  Protein ?No results for input(s): CRP in the last 72 hours. ? ?Microbiology: ? ?Studies/Results: ?CT ABDOMEN PELVIS W CONTRAST ? ?Result Date: 01/04/2022 ?CLINICAL DATA:  Left flank pain. EXAM: CT ABDOMEN AND PELVIS WITH CONTRAST TECHNIQUE: Multidetector CT imaging of the abdomen and pelvis was performed using the standard protocol following bolus administration of intravenous contrast. RADIATION DOSE REDUCTION: This exam was performed according to the departmental dose-optimization program which includes automated exposure control, adjustment of the mA and/or kV according to patient size and/or use of iterative reconstruction technique. CONTRAST:  OMNIPAQUE IOHEXOL 300 MG/ML  SOLN COMPARISON:  None Available. FINDINGS: Factors affecting image quality: There is motion artifact particularly in the pelvis. Lower chest: No acute abnormality. The cardiac size normal. There is no pericardial effusion. There is calcification in the distal LAD coronary artery. There are no lung base infiltrates. Small hiatal hernia. Hepatobiliary: 21 cm length mildly steatotic liver. No mass enhancement. Unremarkable bile ducts and contracted gallbladder. Pancreas: No focal abnormality. Spleen: Enlarged measuring 16 cm in length with no mass enhancement. Engorged splenic vein. Adrenals/Urinary Tract: There is no adrenal mass, no focal abnormality in the left renal cortex. There is a single too small to characterize hypodensity in the posterior aspect of the right kidney. Stones are noted measuring 2 mm and 3 mm, nonobstructing, in the left lower pole collecting system and there is a punctate nonobstructive caliceal stone in the right upper pole and in the right lower pole, but there is  no obstructing urinary stone or hydronephrosis and no intravesical stone or bladder thickening allowing for pelvic motion artifact. There are bilateral extrarenal pelves. Stomach/Bowel: Small hiatal hernia. There are mild thickened folds in the stomach  and left upper to mid abdominal small bowel. There are no dilated small bowel segments or Intestinal inflammatory changes. The appendix is not well seen due to pelvic motion artifact but is believed to be normal in caliber. There is moderate retained stool without evidence of focal diverticulitis or proctocolitis. Vascular/Lymphatic: There is moderate aortoiliac calcific plaque without aneurysm. The portal vein is prominent measuring 2 cm. There is circumaortic left renal vein. No enlarged lymph nodes are seen. Reproductive: There is no prostatomegaly. Other: There is trace ascites in the posterior deep pelvis. There is mild nonspecific edema in the right lower quadrant mesentery. There is no free air, pneumatosis or free hemorrhage. There are right-greater-than-left inguinal fat hernias with small lymph nodes in the right inguinal hernia and tiny umbilical fat hernia. No incarcerated hernia is seen. Musculoskeletal: There are degenerative changes in the thoracic and lumbar spine, mild asymmetric left hip DJD. No concerning regional bone lesion. IMPRESSION: 1. Bilateral nonobstructive nephrolithiasis. There are extrarenal pelves but no intrarenal caliectasis is seen, no ureteral or intravesical stones or ureterectasis. 2. Gastroenteritis, constipation and uncomplicated diverticulosis. No bowel obstruction or focal inflammatory changes. 3. Nonspecific edema in the right lower quadrant mesentery, could be congestive in etiology or inflammatory/infectious, but the appendix is believed to be normal caliber as well as can be seen through the motion artifact, and no focal inflammatory change related to bowel is evident. 4. Mild hepatosplenomegaly, mild hepatic steatosis, prominent portal vein measuring 2 cm caliber. 5. Aortic and coronary artery atherosclerosis. 6. Small umbilical and inguinal fat hernias and a small hiatal hernia. 7. Trace ascites in the posterior deep pelvis. No free air, abscess, hemorrhage or  pneumatosis. Electronically Signed   By: Almira Bar M.D.   On: 01/04/2022 22:12  ? ?DG Chest Port 1 View ? ?Result Date: 01/04/2022 ?CLINICAL DATA:  Questionable sepsis - evaluate for abnormality EXAM: PORTABLE CHEST 1 VIEW COMPARISON:  Radiograph 03/02/2012 FINDINGS: The cardiomediastinal silhouette is within normal limits. There are faint bibasilar airspace opacities. No pleural effusion. No pneumothorax. No acute osseous abnormality. IMPRESSION: Faint bibasilar airspace opacities, could be atelectasis or developing pneumonia. Electronically Signed   By: Caprice Renshaw M.D.   On: 01/04/2022 21:06  ? ?ECHOCARDIOGRAM COMPLETE ? ?Result Date: 01/05/2022 ?   ECHOCARDIOGRAM REPORT   Patient Name:   Derek Dunn Date of Exam: 01/05/2022 Medical Rec #:  [id number]        Height:       75.0 in Accession #:    [id number]       Weight:       219.4 lb Date of Birth:  [birth year]        BSA:          2.282 m? Patient Age:    50 years         BP:           134/79 mmHg Patient Gender: M                HR:           82 bpm. Exam Location:  [facility] Procedure: 2D Echo, Cardiac Doppler and Color Doppler Indications:     Bacteremia R78.81  History:         Patient has no prior history of Echocardiogram  examinations.                  Risk Factors:Hypertension, Dyslipidemia and IV Drug User.  Sonographer:     Thurman Coyerasey Kirkpatrick RDCS Referring Phys:  [id number] AVA SWAYZE Diagnosing Phys: Julien Nordmannimothy Gollan MD IMPRESSIONS  1. Left ventricular ejection fraction, by estimation, is 60 to 65%. The left ventricle has normal function. The left ventricle has no regional wall motion abnormalities. Left ventricular diastolic parameters are consistent with Grade I diastolic dysfunction (impaired relaxation).  2. Right ventricular systolic function is normal. The right ventricular size is normal. There is normal pulmonary artery systolic pressure. The estimated right ventricular systolic pressure is 32.5 mmHg.  3. The mitral valve is normal in structure. Mild  mitral valve regurgitation. No evidence of mitral stenosis.  4. The aortic valve is normal in structure. Aortic valve regurgitation is not visualized. No aortic stenosis is present.  5. There is mild dilatation o

## 2022-01-06 NOTE — Assessment & Plan Note (Signed)
-  ID consult ?-Rule out discitis/vertebral osteomyelitis.  Will need MRI of the lumbar spine and thoracic spine.  Will need 2D echo.  And TEE if needed for ruling out endocarditis ?Discontinue Vanco cefepime and metronidazole and start nafcillin 2 g IV every 4 which can be given as a continuous infusion. ?

## 2022-01-06 NOTE — Plan of Care (Signed)
?  Problem: Education: ?Goal: Knowledge of General Education information will improve ?Description: Including pain rating scale, medication(s)/side effects and non-pharmacologic comfort measures ?01/06/2022 1823 by Evelena Peat, RN ?Outcome: Completed/Met ?01/06/2022 1007 by Evelena Peat, RN ?Outcome: Progressing ?  ?Problem: Health Behavior/Discharge Planning: ?Goal: Ability to manage health-related needs will improve ?01/06/2022 1823 by Evelena Peat, RN ?Outcome: Completed/Met ?01/06/2022 1007 by Evelena Peat, RN ?Outcome: Progressing ?  ?Problem: Clinical Measurements: ?Goal: Ability to maintain clinical measurements within normal limits will improve ?01/06/2022 1823 by Evelena Peat, RN ?Outcome: Completed/Met ?01/06/2022 1007 by Evelena Peat, RN ?Outcome: Progressing ?Goal: Will remain free from infection ?01/06/2022 1823 by Evelena Peat, RN ?Outcome: Completed/Met ?01/06/2022 1007 by Evelena Peat, RN ?Outcome: Progressing ?Goal: Diagnostic test results will improve ?01/06/2022 1823 by Evelena Peat, RN ?Outcome: Completed/Met ?01/06/2022 1007 by Evelena Peat, RN ?Outcome: Progressing ?Goal: Respiratory complications will improve ?01/06/2022 1823 by Evelena Peat, RN ?Outcome: Completed/Met ?01/06/2022 1007 by Evelena Peat, RN ?Outcome: Progressing ?Goal: Cardiovascular complication will be avoided ?01/06/2022 1823 by Evelena Peat, RN ?Outcome: Completed/Met ?01/06/2022 1007 by Evelena Peat, RN ?Outcome: Progressing ?  ?

## 2022-01-06 NOTE — Plan of Care (Signed)
?  Problem: Education: ?Goal: Knowledge of General Education information will improve ?Description: Including pain rating scale, medication(s)/side effects and non-pharmacologic comfort measures ?Outcome: Progressing ?  ?Problem: Health Behavior/Discharge Planning: ?Goal: Ability to manage health-related needs will improve ?Outcome: Progressing ?  ?Problem: Health Behavior/Discharge Planning: ?Goal: Ability to manage health-related needs will improve ?Outcome: Progressing ?  ?Problem: Clinical Measurements: ?Goal: Ability to maintain clinical measurements within normal limits will improve ?Outcome: Progressing ?Goal: Will remain free from infection ?Outcome: Progressing ?Goal: Diagnostic test results will improve ?Outcome: Progressing ?Goal: Respiratory complications will improve ?Outcome: Progressing ?Goal: Cardiovascular complication will be avoided ?Outcome: Progressing ?  ?Problem: Clinical Measurements: ?Goal: Ability to maintain clinical measurements within normal limits will improve ?Outcome: Progressing ?Goal: Will remain free from infection ?Outcome: Progressing ?Goal: Diagnostic test results will improve ?Outcome: Progressing ?Goal: Respiratory complications will improve ?Outcome: Progressing ?Goal: Cardiovascular complication will be avoided ?Outcome: Progressing ?  ?

## 2022-01-06 NOTE — Progress Notes (Signed)
Patient left AMA due to him "making the decision with his family that he does not need to be here anymore." MD aware that patient is leaving AMA. PIVX2 REMOVED with cathter intact.  ?

## 2022-01-07 ENCOUNTER — Inpatient Hospital Stay
Admission: EM | Admit: 2022-01-07 | Discharge: 2022-01-08 | DRG: 871 | Discharge status: Against Medical Advice | Payer: Self-pay | Attending: Internal Medicine | Admitting: Internal Medicine

## 2022-01-07 ENCOUNTER — Other Ambulatory Visit: Payer: Self-pay

## 2022-01-07 DIAGNOSIS — R1030 Lower abdominal pain, unspecified: Secondary | ICD-10-CM | POA: Diagnosis present

## 2022-01-07 DIAGNOSIS — F1910 Other psychoactive substance abuse, uncomplicated: Secondary | ICD-10-CM | POA: Diagnosis present

## 2022-01-07 DIAGNOSIS — Z5900 Homelessness unspecified: Secondary | ICD-10-CM

## 2022-01-07 DIAGNOSIS — Z5329 Procedure and treatment not carried out because of patient's decision for other reasons: Secondary | ICD-10-CM | POA: Diagnosis not present

## 2022-01-07 DIAGNOSIS — E785 Hyperlipidemia, unspecified: Secondary | ICD-10-CM | POA: Diagnosis present

## 2022-01-07 DIAGNOSIS — A4101 Sepsis due to Methicillin susceptible Staphylococcus aureus: Principal | ICD-10-CM | POA: Diagnosis present

## 2022-01-07 DIAGNOSIS — M009 Pyogenic arthritis, unspecified: Secondary | ICD-10-CM | POA: Diagnosis present

## 2022-01-07 DIAGNOSIS — F419 Anxiety disorder, unspecified: Secondary | ICD-10-CM | POA: Diagnosis present

## 2022-01-07 DIAGNOSIS — M4650 Other infective spondylopathies, site unspecified: Secondary | ICD-10-CM

## 2022-01-07 DIAGNOSIS — R7881 Bacteremia: Principal | ICD-10-CM

## 2022-01-07 DIAGNOSIS — F17210 Nicotine dependence, cigarettes, uncomplicated: Secondary | ICD-10-CM | POA: Diagnosis present

## 2022-01-07 DIAGNOSIS — G062 Extradural and subdural abscess, unspecified: Secondary | ICD-10-CM

## 2022-01-07 DIAGNOSIS — M4620 Osteomyelitis of vertebra, site unspecified: Secondary | ICD-10-CM | POA: Diagnosis present

## 2022-01-07 DIAGNOSIS — M4627 Osteomyelitis of vertebra, lumbosacral region: Secondary | ICD-10-CM | POA: Diagnosis present

## 2022-01-07 DIAGNOSIS — B9561 Methicillin susceptible Staphylococcus aureus infection as the cause of diseases classified elsewhere: Secondary | ICD-10-CM | POA: Diagnosis present

## 2022-01-07 DIAGNOSIS — M4657 Other infective spondylopathies, lumbosacral region: Secondary | ICD-10-CM | POA: Diagnosis present

## 2022-01-07 DIAGNOSIS — Z720 Tobacco use: Secondary | ICD-10-CM | POA: Diagnosis present

## 2022-01-07 DIAGNOSIS — K4090 Unilateral inguinal hernia, without obstruction or gangrene, not specified as recurrent: Secondary | ICD-10-CM | POA: Diagnosis present

## 2022-01-07 DIAGNOSIS — M869 Osteomyelitis, unspecified: Secondary | ICD-10-CM | POA: Diagnosis present

## 2022-01-07 HISTORY — DX: Endocarditis, valve unspecified: I38

## 2022-01-07 LAB — CULTURE, BLOOD (ROUTINE X 2)
Special Requests: ADEQUATE
Special Requests: ADEQUATE

## 2022-01-07 LAB — PROCALCITONIN: Procalcitonin: 0.5 ng/mL

## 2022-01-07 LAB — URINE CULTURE: Culture: >=100000 — AB

## 2022-01-07 LAB — BASIC METABOLIC PANEL
Anion gap: 7 (ref 5–15)
BUN: 15 mg/dL (ref 6–20)
CO2: 25 mmol/L (ref 22–32)
Calcium: 8.4 mg/dL — ABNORMAL LOW (ref 8.9–10.3)
Chloride: 103 mmol/L (ref 98–111)
Creatinine, Ser: 0.68 mg/dL (ref 0.61–1.24)
GFR, Estimated: >60 mL/min (ref >60)
Glucose, Bld: 116 mg/dL — ABNORMAL HIGH (ref 70–99)
Potassium: 3.5 mmol/L (ref 3.5–5.1)
Sodium: 135 mmol/L (ref 135–145)

## 2022-01-07 LAB — CBC WITH DIFFERENTIAL/PLATELET
Abs Immature Granulocytes: 0.03 10*3/uL (ref 0.00–0.07)
Basophils Absolute: 0 10*3/uL (ref 0.0–0.1)
Basophils Relative: 0 %
Eosinophils Absolute: 0.1 10*3/uL (ref 0.0–0.5)
Eosinophils Relative: 1 %
HCT: 35.4 % — ABNORMAL LOW (ref 39.0–52.0)
Hemoglobin: 11.2 g/dL — ABNORMAL LOW (ref 13.0–17.0)
Immature Granulocytes: 1 %
Lymphocytes Relative: 15 %
Lymphs Abs: 1 10*3/uL (ref 0.7–4.0)
MCH: 25.4 pg — ABNORMAL LOW (ref 26.0–34.0)
MCHC: 31.6 g/dL (ref 30.0–36.0)
MCV: 80.3 fL (ref 80.0–100.0)
Monocytes Absolute: 0.6 10*3/uL (ref 0.1–1.0)
Monocytes Relative: 9 %
Neutro Abs: 4.8 10*3/uL (ref 1.7–7.7)
Neutrophils Relative %: 74 %
Platelets: 204 10*3/uL (ref 150–400)
RBC: 4.41 MIL/uL (ref 4.22–5.81)
RDW: 14.2 % (ref 11.5–15.5)
WBC: 6.5 10*3/uL (ref 4.0–10.5)
nRBC: 0 % (ref 0.0–0.2)

## 2022-01-07 LAB — HEPATIC FUNCTION PANEL
ALT: 11 U/L (ref 0–44)
AST: 16 U/L (ref 15–41)
Albumin: 3.5 g/dL (ref 3.5–5.0)
Alkaline Phosphatase: 71 U/L (ref 38–126)
Bilirubin, Direct: <0.1 mg/dL (ref 0.0–0.2)
Total Bilirubin: 0.6 mg/dL (ref 0.3–1.2)
Total Protein: 7.3 g/dL (ref 6.5–8.1)

## 2022-01-07 LAB — LACTIC ACID, PLASMA: Lactic Acid, Venous: 1.1 mmol/L (ref 0.5–1.9)

## 2022-01-07 LAB — C-REACTIVE PROTEIN: CRP: 16.3 mg/dL — ABNORMAL HIGH (ref <1.0)

## 2022-01-07 LAB — HCV RNA QUANT: HCV Quantitative: NOT DETECTED IU/mL (ref >50)

## 2022-01-07 LAB — SEDIMENTATION RATE: Sed Rate: 4 mm/hr (ref 0–20)

## 2022-01-07 MED ORDER — OXYCODONE HCL 5 MG PO TABS: 5.0000 mg | ORAL_TABLET | ORAL | Status: DC | PRN

## 2022-01-07 MED ORDER — BUPRENORPHINE HCL-NALOXONE HCL 8-2 MG SL SUBL
1.0000 | SUBLINGUAL_TABLET | Freq: Two times a day (BID) | SUBLINGUAL | Status: DC
  Administered 2022-01-08: 1 via SUBLINGUAL
  Filled 2022-01-07: qty 2

## 2022-01-07 MED ORDER — SODIUM CHLORIDE 0.9% FLUSH
3.0000 mL | Freq: Two times a day (BID) | INTRAVENOUS | Status: DC
  Administered 2022-01-08: 3 mL via INTRAVENOUS

## 2022-01-07 MED ORDER — POLYETHYLENE GLYCOL 3350 17 G PO PACK
17.0000 g | PACK | Freq: Every day | ORAL | Status: DC | PRN
  Administered 2022-01-08: 17 g via ORAL
  Filled 2022-01-07: qty 1

## 2022-01-07 MED ORDER — TRAZODONE HCL 50 MG PO TABS
50.0000 mg | ORAL_TABLET | Freq: Every evening | ORAL | Status: DC | PRN
  Administered 2022-01-07: 50 mg via ORAL
  Filled 2022-01-07: qty 1

## 2022-01-07 MED ORDER — BUPRENORPHINE HCL-NALOXONE HCL 8-2 MG SL SUBL
2.0000 | SUBLINGUAL_TABLET | Freq: Every day | SUBLINGUAL | Status: DC
Start: 2022-01-07 — End: 2022-01-07
  Administered 2022-01-07: 2 via SUBLINGUAL
  Filled 2022-01-07: qty 2

## 2022-01-07 MED ORDER — IBUPROFEN 400 MG PO TABS
400.0000 mg | ORAL_TABLET | Freq: Four times a day (QID) | ORAL | Status: DC | PRN
  Administered 2022-01-07: 400 mg via ORAL
  Filled 2022-01-07: qty 1

## 2022-01-07 MED ORDER — SODIUM CHLORIDE 0.9 % IV SOLN
2.0000 g | INTRAVENOUS | Status: DC
  Administered 2022-01-07 – 2022-01-08 (×4): 2 g via INTRAVENOUS
  Filled 2022-01-07: qty 8
  Filled 2022-01-07: qty 2
  Filled 2022-01-07: qty 8
  Filled 2022-01-07: qty 2
  Filled 2022-01-07 (×2): qty 8

## 2022-01-07 MED ORDER — SODIUM CHLORIDE 0.9 % IV SOLN
2.0000 g | Freq: Once | INTRAVENOUS | Status: AC
  Administered 2022-01-07: 2 g via INTRAVENOUS
  Filled 2022-01-07: qty 8

## 2022-01-07 MED ORDER — GABAPENTIN 300 MG PO CAPS
900.0000 mg | ORAL_CAPSULE | Freq: Three times a day (TID) | ORAL | Status: DC
  Administered 2022-01-07 – 2022-01-08 (×3): 900 mg via ORAL
  Filled 2022-01-07 (×3): qty 3

## 2022-01-07 MED ORDER — ENOXAPARIN SODIUM 40 MG/0.4ML IJ SOSY
40.0000 mg | PREFILLED_SYRINGE | INTRAMUSCULAR | Status: DC
  Administered 2022-01-07: 40 mg via SUBCUTANEOUS
  Filled 2022-01-07: qty 0.4

## 2022-01-07 MED ORDER — KETAMINE HCL 50 MG/5ML IJ SOSY
0.3000 mg/kg | PREFILLED_SYRINGE | Freq: Once | INTRAMUSCULAR | Status: AC
  Administered 2022-01-07: 27 mg via INTRAVENOUS
  Filled 2022-01-07: qty 5

## 2022-01-07 MED ORDER — ONDANSETRON HCL 4 MG/2ML IJ SOLN: 4.0000 mg | Freq: Four times a day (QID) | INTRAMUSCULAR | Status: DC | PRN

## 2022-01-07 MED ORDER — LACTATED RINGERS IV BOLUS
1000.0000 mL | Freq: Once | INTRAVENOUS | Status: AC
Start: 2022-01-07 — End: 2022-01-07
  Administered 2022-01-07: 1000 mL via INTRAVENOUS

## 2022-01-07 MED ORDER — ONDANSETRON HCL 4 MG PO TABS: 4.0000 mg | ORAL_TABLET | Freq: Four times a day (QID) | ORAL | Status: DC | PRN

## 2022-01-07 NOTE — Assessment & Plan Note (Signed)
Pansensitive MSSA based on blood culture from 3 days prior, have resumed nafcillin.  Will need TEE to rule out endocarditis.  Repeat blood culture ordered for tomorrow morning, trend daily until clear. ?

## 2022-01-07 NOTE — Assessment & Plan Note (Signed)
TOC consult placed ?

## 2022-01-07 NOTE — Assessment & Plan Note (Signed)
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  He understands that he will need a prolonged course of antibiotics for these infections and is willing to stay. ?- Nafcillin 2 g every 4 hours ?- FYI message sent to ID to let them know patient has returned to the hospital ?- Coordinate TEE to rule out endocarditis on Monday ?

## 2022-01-07 NOTE — H&P (Signed)
?History and Physical  ? ? ?Patient: Derek Dunn YPP:[id number] DOB: [birth year] ?DOA: 01/07/2022 ?DOS: the patient was seen and examined on 01/07/2022 ?PCP: Pcp, No  ?Patient coming from: Homeless ? ?Chief Complaint:  ?Chief Complaint  ?Patient presents with  ? Groin Pain  ? ?HPI: Derek Dunn is a 51 y.o. male with medical history significant for polysubstance use disorder, homelessness, and admission 2 days prior with AMA discharge yesterday, who presents to the ED with groin pain. ? ?Patient was admitted 2 days prior on May 4, initially presented with low back pain was found to have septic arthritis of the lumbosacral spine as well as epidural and paraspinal abscesses.  Blood cultures were positive for MSSA.  Thoracic echocardiogram was obtained which did not show any evidence of endocarditis. However patient decided to leave AMA. ? ?Today patient presented to the ED with primary complaint of right inguinal hernia that has been present for 6 months.  Per discussion with ED provider he requested this be reduced which was ultimately done with sedation via ketamine.  Lactic acid and procalcitonin were normal. ? ?On my history patient reports that he is very scared about what is happening to him and he does not fully understand what the situation is.  He endorses some ongoing mild back pain but says that it is much better than when he first presented to the hospital few days ago.  He denies any chest pain, palpitations, fevers.  He denies having used any illicit substances since he left the hospital.  He primarily uses opioids and methamphetamines, but feels much better since he received a dose of Suboxone.  He would like to continue this along with gabapentin for anxiety while he is admitted.  He is currently homeless, but very willing to stay for any length of treatment that is required. ? ?In the ED vital signs were unremarkable.  CBC showed only baseline mild anemia, CMP was unremarkable.  Readmission to  treat patient's septic arthritis, epidural abscess, and bacteremia was discussed with patient and he was amenable to this.  Nafcillin was restarted in the ED and he was admitted for further management. ?  ?Review of Systems: As mentioned in the history of present illness. All other systems reviewed and are negative. ?Past Medical History:  ?Diagnosis Date  ? Endocarditis   ? Hyperlipidemia   ? ?History reviewed. No pertinent surgical history. ?Social History:  reports that he has been smoking cigarettes. He has a 15.00 pack-year smoking history. He has never used smokeless tobacco. He reports current drug use. Drugs: IV and Methamphetamines. He reports that he does not drink alcohol. ? ?No Known Allergies ? ?History reviewed. No pertinent family history. ? ?Prior to Admission medications   ?Not on File  ? ? ?Physical Exam: ?Vitals:  ? 01/07/22 1240 01/07/22 1241 01/07/22 1430 01/07/22 1500  ?BP: 124/72  112/75 131/77  ?Pulse: 82  79 82  ?Resp: 16  19 18   ?Temp: (!) 97.5 ?F (36.4 ?C)     ?TempSrc: Oral     ?SpO2: 99%  100% 98%  ?Weight:  88.5 kg    ?Height:  6\' 3"  (1.905 m)    ? ?Physical Exam ?Constitutional:   ?   General: He is not in acute distress. ?   Appearance: Normal appearance. He is normal weight. He is not ill-appearing, toxic-appearing or diaphoretic.  ?Eyes:  ?   General: No scleral icterus. ?Cardiovascular:  ?   Rate and Rhythm: Normal rate and regular rhythm.  ?  Pulses: Normal pulses.  ?   Heart sounds: Normal heart sounds. No murmur heard. ?Pulmonary:  ?   Effort: Pulmonary effort is normal. No respiratory distress.  ?   Breath sounds: Normal breath sounds. No wheezing or rales.  ?Abdominal:  ?   General: Abdomen is flat. Bowel sounds are normal. There is no distension.  ?   Palpations: Abdomen is soft. There is no mass.  ?   Tenderness: There is no abdominal tenderness.  ?Skin: ?   General: Skin is warm and dry.  ?Neurological:  ?   General: No focal deficit present.  ?   Mental Status: He is  alert.  ?   Coordination: Coordination normal.  ?Psychiatric:     ?   Mood and Affect: Mood normal.     ?   Behavior: Behavior normal.  ? ? ? ?Data Reviewed: ? ?There are no new results to review at this time. ? ?Results for orders placed or performed during the hospital encounter of 01/07/22 (from the past 24 hour(s))  ?Basic metabolic panel     Status: Abnormal  ? Collection Time: 01/07/22 12:53 PM  ?Result Value Ref Range  ? Sodium 135 135 - 145 mmol/L  ? Potassium 3.5 3.5 - 5.1 mmol/L  ? Chloride 103 98 - 111 mmol/L  ? CO2 25 22 - 32 mmol/L  ? Glucose, Bld 116 (H) 70 - 99 mg/dL  ? BUN 15 6 - 20 mg/dL  ? Creatinine, Ser 0.68 0.61 - 1.24 mg/dL  ? Calcium 8.4 (L) 8.9 - 10.3 mg/dL  ? GFR, Estimated >60 >60 mL/min  ? Anion gap 7 5 - 15  ?CBC with Differential     Status: Abnormal  ? Collection Time: 01/07/22 12:53 PM  ?Result Value Ref Range  ? WBC 6.5 4.0 - 10.5 K/uL  ? RBC 4.41 4.22 - 5.81 MIL/uL  ? Hemoglobin 11.2 (L) 13.0 - 17.0 g/dL  ? HCT 35.4 (L) 39.0 - 52.0 %  ? MCV 80.3 80.0 - 100.0 fL  ? MCH 25.4 (L) 26.0 - 34.0 pg  ? MCHC 31.6 30.0 - 36.0 g/dL  ? RDW 14.2 11.5 - 15.5 %  ? Platelets 204 150 - 400 K/uL  ? nRBC 0.0 0.0 - 0.2 %  ? Neutrophils Relative % 74 %  ? Neutro Abs 4.8 1.7 - 7.7 K/uL  ? Lymphocytes Relative 15 %  ? Lymphs Abs 1.0 0.7 - 4.0 K/uL  ? Monocytes Relative 9 %  ? Monocytes Absolute 0.6 0.1 - 1.0 K/uL  ? Eosinophils Relative 1 %  ? Eosinophils Absolute 0.1 0.0 - 0.5 K/uL  ? Basophils Relative 0 %  ? Basophils Absolute 0.0 0.0 - 0.1 K/uL  ? Immature Granulocytes 1 %  ? Abs Immature Granulocytes 0.03 0.00 - 0.07 K/uL  ?Hepatic function panel     Status: None  ? Collection Time: 01/07/22  1:38 PM  ?Result Value Ref Range  ? Total Protein 7.3 6.5 - 8.1 g/dL  ? Albumin 3.5 3.5 - 5.0 g/dL  ? AST 16 15 - 41 U/L  ? ALT 11 0 - 44 U/L  ? Alkaline Phosphatase 71 38 - 126 U/L  ? Total Bilirubin 0.6 0.3 - 1.2 mg/dL  ? Bilirubin, Direct <0.1 0.0 - 0.2 mg/dL  ? Indirect Bilirubin NOT CALCULATED 0.3 - 0.9  mg/dL  ?Procalcitonin - Baseline     Status: None  ? Collection Time: 01/07/22  1:38 PM  ?Result Value Ref Range  ?  Procalcitonin 0.50 ng/mL  ?Lactic acid, plasma     Status: None  ? Collection Time: 01/07/22  1:38 PM  ?Result Value Ref Range  ? Lactic Acid, Venous 1.1 0.5 - 1.9 mmol/L  ? ?MR THORACIC SPINE W WO CONTRAST ? ?Result Date: 01/06/2022 ?CLINICAL DATA:  Back pain with left-sided radiculopathy. History of IV drug use. MSSA bacteremia. Assess for vertebral discitis-osteomyelitis EXAM: MRI THORACIC AND LUMBAR SPINE WITHOUT AND WITH CONTRAST TECHNIQUE: Multiplanar and multiecho pulse sequences of the thoracic and lumbar spine were obtained without and with intravenous contrast. CONTRAST:  10mL GADAVIST GADOBUTROL 1 MMOL/ML IV SOLN COMPARISON:  CT abdomen pelvis 01/04/2022 FINDINGS: MRI THORACIC SPINE FINDINGS Alignment:  Physiologic. Vertebrae: No fracture, evidence of discitis, or bone lesion. Cord:  Normal signal and morphology.  No epidural fluid collection. Paraspinal and other soft tissues: No paravertebral inflammatory changes or fluid collections. Disc levels: Shallow noncompressive disc protrusions at T6-7, T7-8, and T8-9. Remaining discs are unremarkable. No foraminal or canal stenosis at any level. MRI LUMBAR SPINE FINDINGS Segmentation:  Standard. Alignment:  Physiologic. Vertebrae: Bilateral facet joint effusions at the L4-5 and L5-S1 levels. Rim enhancing fluid collection within the posterior paraspinal soft tissues contiguous with the left L5-S1 facet joint measuring 2.0 x 1.6 x 2.0 cm (series 6, image 35). There is also a rounded 0.6 cm T2 hyperintense structure along the posterior margin of the right L5-S1 facet joint without rim enhancement, favored to represent a facet synovial cyst (series 6, image 35). Mild bone marrow edema adjacent to the bilateral L5-S1, and to a lesser degree L4-5, facet joints. No obvious erosion. No evidence of discitis. Minimal discogenic endplate marrow changes along  the superior endplate of L1. No fracture. No suspicious bone lesion. Conus medullaris: Extends to the T12-L1 level and appears normal. Small rim enhancing epidural fluid collection within the sacral spine at S1

## 2022-01-07 NOTE — Assessment & Plan Note (Signed)
Patient is status post 16 mg of Suboxone in the ED.  He would like to continue this dose along with gabapentin for anxiety which I think is reasonable.  We will also need additional screening labs for harm reduction. ?- Suboxone 8-2 twice daily ?- Follow-up HCV quantitative RNA result ?- We will send screening labs for hepatitis A and hepatitis B immunity, will immunize as needed ?- Ensure patient is prescribed Narcan at time of discharge ?- Ensure patient is connected with long-term treatment for his OUD at time of discharge ?

## 2022-01-07 NOTE — ED Notes (Signed)
Pt states has not had his regular suboxone today. Did have it yesterday. ?

## 2022-01-07 NOTE — ED Triage Notes (Signed)
Pt to ED [facility] for R groin pain from known hernia. Pt does have visible hernia to R groin. Pt also has back pain. Pt left AMA last night after came for back pain and diagnosed with endocarditis. ? ?Pt admits to regular heroin use, last use 3 days ago. ?

## 2022-01-07 NOTE — Assessment & Plan Note (Signed)
See osteomyelitis problem ?

## 2022-01-07 NOTE — ED Provider Notes (Signed)
? ?[facility] ?Provider Note ? ? ? Event Date/Time  ? First MD Initiated Contact with Patient 01/07/22 1239   ?  (approximate) ? ? ?History  ? ?Groin Pain ? ? ?HPI ? ?Derek Dunn is a 51 y.o. male past medical history of homelessness and polysubstance IV drug use as well as recent admission having left hospital yesterday AMA for evaluation of some left lower back pain rating around the left leg found to be MSSA bacteremic with septic arthritis of the left L5-S1 facet joint as well as findings on MRI concerning for small paraspinal abscess at L5-S1 and S1 without evidence of discitis or osteomyelitis.  He is returning to the emergency room today primarily with concerns for right inguinal hernia he states has been present for 6 months but is more painful today.  He denies any illegal drug use since leaving.  No new fevers, cough, chest pain, headache, earache, sore throat or change in his left lower back pain rating down the left leg or any new weakness or numbness.  He has no other acute concerns at this time. ? ?  ? ? ?Physical Exam  ?Triage Vital Signs: ?ED Triage Vitals  ?Enc Vitals Group  ?   BP 01/07/22 1240 124/72  ?   Pulse Rate 01/07/22 1240 82  ?   Resp 01/07/22 1240 16  ?   Temp 01/07/22 1240 (!) 97.5 ?F (36.4 ?C)  ?   Temp Source 01/07/22 1240 Oral  ?   SpO2 01/07/22 1240 99 %  ?   Weight 01/07/22 1241 195 lb (88.5 kg)  ?   Height 01/07/22 1241 6\' 3"  (1.905 m)  ?   Head Circumference --   ?   Peak Flow --   ?   Pain Score 01/07/22 1241 10  ?   Pain Loc --   ?   Pain Edu? --   ?   Excl. in GC? --   ? ? ?Most recent vital signs: ?Vitals:  ? 01/07/22 1240 01/07/22 1430  ?BP: 124/72 112/75  ?Pulse: 82 79  ?Resp: 16 19  ?Temp: (!) 97.5 ?F (36.4 ?C)   ?SpO2: 99% 100%  ? ? ?General: Awake, no distress ?CV:  Good peripheral perfusion.  2+ radial pulse. ?Resp:  Normal effort.  Bilaterally ?Abd:  No distention.  Soft.  Right inguinal hernia. ?Other:  Patient has full strength of the  bilateral lower extremities.  Sensation is intact light touch of the bilateral lower extremities.  ? ? ?ED Results / Procedures / Treatments  ?Labs ?(all labs ordered are listed, but only abnormal results are displayed) ?Labs Reviewed  ?BASIC METABOLIC PANEL - Abnormal; Notable for the following components:  ?    Result Value  ? Glucose, Bld 116 (*)   ? Calcium 8.4 (*)   ? All other components within normal limits  ?CBC WITH DIFFERENTIAL/PLATELET - Abnormal; Notable for the following components:  ? Hemoglobin 11.2 (*)   ? HCT 35.4 (*)   ? MCH 25.4 (*)   ? All other components within normal limits  ?CULTURE, BLOOD (ROUTINE X 2)  ?CULTURE, BLOOD (ROUTINE X 2)  ?HEPATIC FUNCTION PANEL  ?PROCALCITONIN  ?LACTIC ACID, PLASMA  ?C-REACTIVE PROTEIN  ?SEDIMENTATION RATE  ?LACTIC ACID, PLASMA  ? ? ? ?EKG ? ? ? ?RADIOLOGY ? ?MR T and L-spine obtained yesterday reviewed by myself show findings of septic arthritis in all 5 S1.  I also reviewed radiologist and findings and agree with their  interpretation of small abscess in the posterior paraspinal soft tissues contiguous with the left L5-S1 facet joint and at the sacral canal and S1 as well as small bilateral L4-L5 facet joint effusions without evidence of discitis or osteomyelitis.  ? ? ?PROCEDURES: ? ?Critical Care performed: No ? ?.1-3 Lead EKG Interpretation ?Performed by: Gilles Chiquito, MD ?Authorized by: Gilles Chiquito, MD  ? ?  Interpretation: normal   ?  ECG rate assessment: normal   ?  Rhythm: sinus rhythm   ?  Ectopy: none   ?  Conduction: normal   ?Hernia reduction ? ?Date/Time: 01/07/2022 2:57 PM ?Performed by: Gilles Chiquito, MD ?Authorized by: Gilles Chiquito, MD  ?Consent: Verbal consent obtained. ?Consent given by: patient ?Patient understanding: patient states understanding of the procedure being performed ?Patient identity confirmed: verbally with patient ?Local anesthesia used: no ? ?Anesthesia: ?Local anesthesia used: no ? ?Sedation: ?Patient sedated:  no ? ?Patient tolerance: patient tolerated the procedure well with no immediate complications ? ? ?The patient is on the cardiac monitor to evaluate for evidence of arrhythmia and/or significant heart rate changes. ? ? ?MEDICATIONS ORDERED IN ED: ?Medications  ?buprenorphine-naloxone (SUBOXONE) 8-2 mg per SL tablet 2 tablet (2 tablets Sublingual Given 01/07/22 1402)  ?nafcillin 2 g in sodium chloride 0.9 % 100 mL IVPB (has no administration in time range)  ?ketamine 50 mg in normal saline 5 mL (10 mg/mL) syringe (27 mg Intravenous Given 01/07/22 1451)  ?lactated ringers bolus 1,000 mL (1,000 mLs Intravenous New Bag/Given 01/07/22 1402)  ? ? ? ?IMPRESSION / MDM / ASSESSMENT AND PLAN / ED COURSE  ?I reviewed the triage vital signs and the nursing notes. ?             ?               ? ?Regard to patient's left inguinal hernia he states this is been present for months although is unclear if he ever has fully reduced it.  It is quite tender and he is refusing to this examiner to attempt reduction prior to receiving some analgesia.  He is amenable to receiving a dose of Suboxone and some ketamine which we will attempt to see if I can manually do so. ? ?Right inguinal hernia successfully dazed after patient given some analgesia.  ? ?BMP shows no significant electrolyte or metabolic derangements.  CBC shows no leukocytosis and hemoglobin of 11.2 compared to 11.4 yesterday.  Hepatic function panel is unremarkable.  Procalcitonin 0.5.  Lactic acid is 1.1 and overall I do not think patient is currently septic.  ? ?Discussed also strong recommendation that he admitted and stay in the hospital for management of his bacteremia and spinal infection.  He is agreeable today to this.  He does not complain or have any evidence on exam of any new focal neurologic deficits necessitating repeat imaging I did reach out to on-call neurosurgeon Dr. Marcell Barlow who agreed with this plan.  He will see patient as a consult once patient is admitted.   We will restart patient on nafcillin and obtain labs and blood cultures.  Elevated medicine service for further evaluation and ongoing management of his bacteremia and spinal infection.  ? ? ? ?  ? ? ?FINAL CLINICAL IMPRESSION(S) / ED DIAGNOSES  ? ?Final diagnoses:  ?Bacteremia  ?Septic arthritis of vertebra, due to unspecified organism [facility])  ?Right inguinal hernia  ? ? ? ?Rx / DC Orders  ? ?ED Discharge Orders   ? ?  None  ? ?  ? ? ? ?Note:  This document was prepared using Dragon voice recognition software and may include unintentional dictation errors. ?  ?Gilles ChiquitoSmith, Orianna Biskup P, MD ?01/07/22 1459 ? ?

## 2022-01-08 ENCOUNTER — Encounter: Payer: Self-pay | Admitting: Infectious Diseases

## 2022-01-08 DIAGNOSIS — M8608 Acute hematogenous osteomyelitis, other sites: Secondary | ICD-10-CM

## 2022-01-08 LAB — CBC
HCT: 32.9 % — ABNORMAL LOW (ref 39.0–52.0)
Hemoglobin: 10.4 g/dL — ABNORMAL LOW (ref 13.0–17.0)
MCH: 25.9 pg — ABNORMAL LOW (ref 26.0–34.0)
MCHC: 31.6 g/dL (ref 30.0–36.0)
MCV: 81.8 fL (ref 80.0–100.0)
Platelets: 205 10*3/uL (ref 150–400)
RBC: 4.02 MIL/uL — ABNORMAL LOW (ref 4.22–5.81)
RDW: 14.3 % (ref 11.5–15.5)
WBC: 4.5 10*3/uL (ref 4.0–10.5)
nRBC: 0 % (ref 0.0–0.2)

## 2022-01-08 LAB — BASIC METABOLIC PANEL
Anion gap: 9 (ref 5–15)
BUN: 13 mg/dL (ref 6–20)
CO2: 27 mmol/L (ref 22–32)
Calcium: 8.4 mg/dL — ABNORMAL LOW (ref 8.9–10.3)
Chloride: 105 mmol/L (ref 98–111)
Creatinine, Ser: 0.74 mg/dL (ref 0.61–1.24)
GFR, Estimated: >60 mL/min (ref >60)
Glucose, Bld: 126 mg/dL — ABNORMAL HIGH (ref 70–99)
Potassium: 3.5 mmol/L (ref 3.5–5.1)
Sodium: 141 mmol/L (ref 135–145)

## 2022-01-08 LAB — BLOOD CULTURE ID PANEL (REFLEXED) - BCID2

## 2022-01-08 LAB — HEPATITIS A ANTIBODY, TOTAL: hep A Total Ab: NONREACTIVE

## 2022-01-08 NOTE — Discharge Summary (Signed)
?  Physician Discharge Summary ?  ?Patient: Derek Dunn MRN: [id number] DOB: 1971-03-21  ?Admit date:     01/07/2022  ?Discharge date: 01/08/22  ?Discharge Physician: Enedina Finner  ? ?PCP: Pcp, No  ? ?patient left the hospital AMA. No discharge diagnoses or medications are prescribed. ? ?Patient had left AMA on fifth may after he came with back pain and was found to have epidural and paraspinal abscesses. Patient was diagnosed with MSSA sepsis. He has history of IV drug abuse. ? ?Patient got readmitted sixth me with groin pain. He was resumed on IV antibiotic. This morning patient told me he has to leave due to some social reason. I explained to him regarding his medical issue and he stay for further treatment. He declined and left AMA. ? ?No charge ? ?Signed: ?Enedina Finner, MD ?[facility] ?01/08/2022 ?

## 2022-01-08 NOTE — Progress Notes (Signed)
Attempted to contact patient for blood cx + for MSSA through his listed phone numbers ( home and mobile), unreachable.  ?I have informed Hospitalist Dr Fritzi Mandes regarding the positive blood cultures and recommended admission and IV cefazolin ? ? ? ?

## 2022-01-08 NOTE — Progress Notes (Signed)
Nurse was notified by Dr. Allena Katz that she just left room after talking with pt and he said he was going to leave AMA. AMA papers taken to pt which he signed.  IV was removed and pt walked out of hospital under his own ability. ?

## 2022-01-10 LAB — HEPATITIS B SURFACE ANTIBODY, QUANTITATIVE: Hep B S AB Quant (Post): <3.1 m[IU]/mL — ABNORMAL LOW (ref >9.9)

## 2022-01-12 LAB — CULTURE, BLOOD (ROUTINE X 2)
Culture: NO GROWTH
Special Requests: ADEQUATE
Special Requests: ADEQUATE

## 2022-01-13 LAB — CULTURE, BLOOD (SINGLE)
Culture: NO GROWTH
Special Requests: ADEQUATE

## 2022-01-13 NOTE — Discharge Summary (Signed)
Left AMA despite education that he had a very serious infection that will worsen without abx potentially leaving him with debility and even causing death. ?Derek Canary DO ?

## 2022-03-14 DIAGNOSIS — M4644 Discitis, unspecified, thoracic region: Secondary | ICD-10-CM | POA: Diagnosis present

## 2022-03-14 DIAGNOSIS — D702 Other drug-induced agranulocytosis: Secondary | ICD-10-CM | POA: Diagnosis not present

## 2022-03-14 DIAGNOSIS — F17210 Nicotine dependence, cigarettes, uncomplicated: Secondary | ICD-10-CM | POA: Diagnosis present

## 2022-03-14 DIAGNOSIS — Z634 Disappearance and death of family member: Secondary | ICD-10-CM

## 2022-03-14 DIAGNOSIS — M4624 Osteomyelitis of vertebra, thoracic region: Secondary | ICD-10-CM | POA: Diagnosis present

## 2022-03-14 DIAGNOSIS — B169 Acute hepatitis B without delta-agent and without hepatic coma: Secondary | ICD-10-CM | POA: Diagnosis present

## 2022-03-14 DIAGNOSIS — Z5900 Homelessness unspecified: Secondary | ICD-10-CM

## 2022-03-14 DIAGNOSIS — G4700 Insomnia, unspecified: Secondary | ICD-10-CM | POA: Diagnosis not present

## 2022-03-14 DIAGNOSIS — M4804 Spinal stenosis, thoracic region: Secondary | ICD-10-CM | POA: Diagnosis present

## 2022-03-14 DIAGNOSIS — M4657 Other infective spondylopathies, lumbosacral region: Secondary | ICD-10-CM | POA: Diagnosis not present

## 2022-03-14 DIAGNOSIS — M40204 Unspecified kyphosis, thoracic region: Secondary | ICD-10-CM | POA: Diagnosis present

## 2022-03-14 DIAGNOSIS — K4020 Bilateral inguinal hernia, without obstruction or gangrene, not specified as recurrent: Secondary | ICD-10-CM | POA: Diagnosis present

## 2022-03-14 DIAGNOSIS — L309 Dermatitis, unspecified: Secondary | ICD-10-CM | POA: Diagnosis present

## 2022-03-14 DIAGNOSIS — D638 Anemia in other chronic diseases classified elsewhere: Secondary | ICD-10-CM | POA: Diagnosis present

## 2022-03-14 DIAGNOSIS — Y92230 Patient room in hospital as the place of occurrence of the external cause: Secondary | ICD-10-CM | POA: Diagnosis not present

## 2022-03-14 DIAGNOSIS — W1830XA Fall on same level, unspecified, initial encounter: Secondary | ICD-10-CM | POA: Diagnosis not present

## 2022-03-14 DIAGNOSIS — G9341 Metabolic encephalopathy: Secondary | ICD-10-CM | POA: Diagnosis present

## 2022-03-14 DIAGNOSIS — E611 Iron deficiency: Secondary | ICD-10-CM | POA: Diagnosis present

## 2022-03-14 DIAGNOSIS — B1920 Unspecified viral hepatitis C without hepatic coma: Secondary | ICD-10-CM | POA: Diagnosis present

## 2022-03-14 DIAGNOSIS — T360X5A Adverse effect of penicillins, initial encounter: Secondary | ICD-10-CM | POA: Diagnosis not present

## 2022-03-14 DIAGNOSIS — K429 Umbilical hernia without obstruction or gangrene: Secondary | ICD-10-CM | POA: Diagnosis present

## 2022-03-14 DIAGNOSIS — I959 Hypotension, unspecified: Secondary | ICD-10-CM | POA: Diagnosis present

## 2022-03-14 DIAGNOSIS — L89312 Pressure ulcer of right buttock, stage 2: Secondary | ICD-10-CM | POA: Diagnosis not present

## 2022-03-14 DIAGNOSIS — G061 Intraspinal abscess and granuloma: Secondary | ICD-10-CM | POA: Diagnosis present

## 2022-03-14 DIAGNOSIS — H6120 Impacted cerumen, unspecified ear: Secondary | ICD-10-CM | POA: Diagnosis present

## 2022-03-14 DIAGNOSIS — Z20822 Contact with and (suspected) exposure to covid-19: Secondary | ICD-10-CM | POA: Diagnosis present

## 2022-03-14 DIAGNOSIS — Z8249 Family history of ischemic heart disease and other diseases of the circulatory system: Secondary | ICD-10-CM

## 2022-03-14 DIAGNOSIS — Z8619 Personal history of other infectious and parasitic diseases: Secondary | ICD-10-CM

## 2022-03-14 DIAGNOSIS — M545 Low back pain, unspecified: Secondary | ICD-10-CM | POA: Diagnosis not present

## 2022-03-14 DIAGNOSIS — E785 Hyperlipidemia, unspecified: Secondary | ICD-10-CM | POA: Diagnosis present

## 2022-03-14 LAB — CBC WITH DIFFERENTIAL/PLATELET
Abs Immature Granulocytes: 0.02 10*3/uL (ref 0.00–0.07)
Basophils Absolute: 0 10*3/uL (ref 0.0–0.1)
Basophils Relative: 1 %
Eosinophils Absolute: 0.1 10*3/uL (ref 0.0–0.5)
Eosinophils Relative: 2 %
HCT: 37 % — ABNORMAL LOW (ref 39.0–52.0)
Hemoglobin: 11.3 g/dL — ABNORMAL LOW (ref 13.0–17.0)
Immature Granulocytes: 0 %
Lymphocytes Relative: 18 %
Lymphs Abs: 1.1 10*3/uL (ref 0.7–4.0)
MCH: 24.4 pg — ABNORMAL LOW (ref 26.0–34.0)
MCHC: 30.5 g/dL (ref 30.0–36.0)
MCV: 79.7 fL — ABNORMAL LOW (ref 80.0–100.0)
Monocytes Absolute: 0.4 10*3/uL (ref 0.1–1.0)
Monocytes Relative: 6 %
Neutro Abs: 4.4 10*3/uL (ref 1.7–7.7)
Neutrophils Relative %: 73 %
Platelets: 386 10*3/uL (ref 150–400)
RBC: 4.64 MIL/uL (ref 4.22–5.81)
RDW: 14.6 % (ref 11.5–15.5)
WBC: 6.1 10*3/uL (ref 4.0–10.5)
nRBC: 0 % (ref 0.0–0.2)

## 2022-03-14 LAB — BASIC METABOLIC PANEL
Anion gap: 4 — ABNORMAL LOW (ref 5–15)
BUN: 15 mg/dL (ref 6–20)
CO2: 30 mmol/L (ref 22–32)
Calcium: 9.1 mg/dL (ref 8.9–10.3)
Chloride: 105 mmol/L (ref 98–111)
Creatinine, Ser: 0.73 mg/dL (ref 0.61–1.24)
GFR, Estimated: >60 mL/min (ref >60)
Glucose, Bld: 124 mg/dL — ABNORMAL HIGH (ref 70–99)
Potassium: 4.5 mmol/L (ref 3.5–5.1)
Sodium: 139 mmol/L (ref 135–145)

## 2022-03-14 NOTE — ED Triage Notes (Signed)
Pt arrives with c/o right sided groin pain. Per pt, he has a hernia in his right groin area. Pt denies injury.

## 2022-03-15 ENCOUNTER — Other Ambulatory Visit: Payer: Self-pay

## 2022-03-15 ENCOUNTER — Emergency Department

## 2022-03-15 ENCOUNTER — Inpatient Hospital Stay
Admission: EM | Admit: 2022-03-15 | Discharge: 2022-04-26 | DRG: 459 | Disposition: A | Discharge status: Home / Self Care | Attending: Internal Medicine | Admitting: Internal Medicine

## 2022-03-15 ENCOUNTER — Inpatient Hospital Stay

## 2022-03-15 ENCOUNTER — Encounter: Payer: Self-pay | Admitting: Internal Medicine

## 2022-03-15 DIAGNOSIS — M4644 Discitis, unspecified, thoracic region: Secondary | ICD-10-CM | POA: Diagnosis present

## 2022-03-15 DIAGNOSIS — M532X4 Spinal instabilities, thoracic region: Secondary | ICD-10-CM | POA: Diagnosis not present

## 2022-03-15 DIAGNOSIS — A4901 Methicillin susceptible Staphylococcus aureus infection, unspecified site: Secondary | ICD-10-CM | POA: Diagnosis not present

## 2022-03-15 DIAGNOSIS — M4014 Other secondary kyphosis, thoracic region: Secondary | ICD-10-CM | POA: Diagnosis not present

## 2022-03-15 DIAGNOSIS — W1830XA Fall on same level, unspecified, initial encounter: Secondary | ICD-10-CM | POA: Diagnosis not present

## 2022-03-15 DIAGNOSIS — K429 Umbilical hernia without obstruction or gangrene: Secondary | ICD-10-CM

## 2022-03-15 DIAGNOSIS — D649 Anemia, unspecified: Secondary | ICD-10-CM

## 2022-03-15 DIAGNOSIS — M40204 Unspecified kyphosis, thoracic region: Secondary | ICD-10-CM | POA: Diagnosis present

## 2022-03-15 DIAGNOSIS — K4020 Bilateral inguinal hernia, without obstruction or gangrene, not specified as recurrent: Secondary | ICD-10-CM | POA: Diagnosis present

## 2022-03-15 DIAGNOSIS — B169 Acute hepatitis B without delta-agent and without hepatic coma: Secondary | ICD-10-CM | POA: Diagnosis present

## 2022-03-15 DIAGNOSIS — I959 Hypotension, unspecified: Secondary | ICD-10-CM | POA: Diagnosis not present

## 2022-03-15 DIAGNOSIS — B1920 Unspecified viral hepatitis C without hepatic coma: Secondary | ICD-10-CM | POA: Diagnosis present

## 2022-03-15 DIAGNOSIS — D72829 Elevated white blood cell count, unspecified: Secondary | ICD-10-CM | POA: Diagnosis not present

## 2022-03-15 DIAGNOSIS — H6120 Impacted cerumen, unspecified ear: Secondary | ICD-10-CM | POA: Diagnosis not present

## 2022-03-15 DIAGNOSIS — D638 Anemia in other chronic diseases classified elsewhere: Secondary | ICD-10-CM

## 2022-03-15 DIAGNOSIS — Z981 Arthrodesis status: Secondary | ICD-10-CM | POA: Diagnosis not present

## 2022-03-15 DIAGNOSIS — G9341 Metabolic encephalopathy: Secondary | ICD-10-CM | POA: Diagnosis present

## 2022-03-15 DIAGNOSIS — M4620 Osteomyelitis of vertebra, site unspecified: Secondary | ICD-10-CM | POA: Diagnosis present

## 2022-03-15 DIAGNOSIS — F17210 Nicotine dependence, cigarettes, uncomplicated: Secondary | ICD-10-CM | POA: Diagnosis present

## 2022-03-15 DIAGNOSIS — K4091 Unilateral inguinal hernia, without obstruction or gangrene, recurrent: Secondary | ICD-10-CM | POA: Diagnosis not present

## 2022-03-15 DIAGNOSIS — L8992 Pressure ulcer of unspecified site, stage 2: Secondary | ICD-10-CM

## 2022-03-15 DIAGNOSIS — K4090 Unilateral inguinal hernia, without obstruction or gangrene, not specified as recurrent: Secondary | ICD-10-CM

## 2022-03-15 DIAGNOSIS — G062 Extradural and subdural abscess, unspecified: Principal | ICD-10-CM

## 2022-03-15 DIAGNOSIS — G4700 Insomnia, unspecified: Secondary | ICD-10-CM | POA: Diagnosis not present

## 2022-03-15 DIAGNOSIS — Y92230 Patient room in hospital as the place of occurrence of the external cause: Secondary | ICD-10-CM | POA: Diagnosis not present

## 2022-03-15 DIAGNOSIS — M4624 Osteomyelitis of vertebra, thoracic region: Secondary | ICD-10-CM

## 2022-03-15 DIAGNOSIS — M4804 Spinal stenosis, thoracic region: Secondary | ICD-10-CM | POA: Diagnosis present

## 2022-03-15 DIAGNOSIS — E785 Hyperlipidemia, unspecified: Secondary | ICD-10-CM | POA: Diagnosis present

## 2022-03-15 DIAGNOSIS — D702 Other drug-induced agranulocytosis: Secondary | ICD-10-CM | POA: Diagnosis not present

## 2022-03-15 DIAGNOSIS — E611 Iron deficiency: Secondary | ICD-10-CM | POA: Clinically undetermined

## 2022-03-15 DIAGNOSIS — M009 Pyogenic arthritis, unspecified: Secondary | ICD-10-CM | POA: Diagnosis present

## 2022-03-15 DIAGNOSIS — M549 Dorsalgia, unspecified: Secondary | ICD-10-CM | POA: Diagnosis present

## 2022-03-15 DIAGNOSIS — G061 Intraspinal abscess and granuloma: Secondary | ICD-10-CM | POA: Diagnosis present

## 2022-03-15 DIAGNOSIS — T8463XA Infection and inflammatory reaction due to internal fixation device of spine, initial encounter: Secondary | ICD-10-CM | POA: Diagnosis not present

## 2022-03-15 DIAGNOSIS — L309 Dermatitis, unspecified: Secondary | ICD-10-CM | POA: Diagnosis present

## 2022-03-15 DIAGNOSIS — R768 Other specified abnormal immunological findings in serum: Secondary | ICD-10-CM | POA: Diagnosis not present

## 2022-03-15 DIAGNOSIS — M546 Pain in thoracic spine: Secondary | ICD-10-CM | POA: Diagnosis not present

## 2022-03-15 DIAGNOSIS — R509 Fever, unspecified: Secondary | ICD-10-CM | POA: Diagnosis not present

## 2022-03-15 DIAGNOSIS — R7401 Elevation of levels of liver transaminase levels: Secondary | ICD-10-CM | POA: Diagnosis present

## 2022-03-15 DIAGNOSIS — Z20822 Contact with and (suspected) exposure to covid-19: Secondary | ICD-10-CM | POA: Diagnosis present

## 2022-03-15 DIAGNOSIS — T360X5A Adverse effect of penicillins, initial encounter: Secondary | ICD-10-CM | POA: Diagnosis not present

## 2022-03-15 DIAGNOSIS — M0008 Staphylococcal arthritis, vertebrae: Secondary | ICD-10-CM | POA: Diagnosis not present

## 2022-03-15 DIAGNOSIS — M545 Low back pain, unspecified: Secondary | ICD-10-CM | POA: Diagnosis present

## 2022-03-15 DIAGNOSIS — Z8619 Personal history of other infectious and parasitic diseases: Secondary | ICD-10-CM | POA: Diagnosis not present

## 2022-03-15 DIAGNOSIS — M4657 Other infective spondylopathies, lumbosacral region: Secondary | ICD-10-CM | POA: Diagnosis present

## 2022-03-15 DIAGNOSIS — L89312 Pressure ulcer of right buttock, stage 2: Secondary | ICD-10-CM | POA: Diagnosis not present

## 2022-03-15 DIAGNOSIS — B9561 Methicillin susceptible Staphylococcus aureus infection as the cause of diseases classified elsewhere: Secondary | ICD-10-CM | POA: Diagnosis not present

## 2022-03-15 HISTORY — DX: Osteomyelitis of vertebra, thoracic region: M46.24

## 2022-03-15 HISTORY — DX: Bacteremia: R78.81

## 2022-03-15 LAB — HEPATIC FUNCTION PANEL
ALT: 12 U/L (ref 0–44)
AST: 13 U/L — ABNORMAL LOW (ref 15–41)
Albumin: 3.3 g/dL — ABNORMAL LOW (ref 3.5–5.0)
Alkaline Phosphatase: 82 U/L (ref 38–126)
Bilirubin, Direct: <0.1 mg/dL (ref 0.0–0.2)
Total Bilirubin: 0.5 mg/dL (ref 0.3–1.2)
Total Protein: 7.8 g/dL (ref 6.5–8.1)

## 2022-03-15 LAB — CBC
HCT: 34 % — ABNORMAL LOW (ref 39.0–52.0)
Hemoglobin: 10.4 g/dL — ABNORMAL LOW (ref 13.0–17.0)
MCH: 24.6 pg — ABNORMAL LOW (ref 26.0–34.0)
MCHC: 30.6 g/dL (ref 30.0–36.0)
MCV: 80.6 fL (ref 80.0–100.0)
Platelets: 363 10*3/uL (ref 150–400)
RBC: 4.22 MIL/uL (ref 4.22–5.81)
RDW: 14.6 % (ref 11.5–15.5)
WBC: 6.7 10*3/uL (ref 4.0–10.5)
nRBC: 0 % (ref 0.0–0.2)

## 2022-03-15 LAB — URINALYSIS, ROUTINE W REFLEX MICROSCOPIC
Bilirubin Urine: NEGATIVE
Glucose, UA: NEGATIVE mg/dL
Hgb urine dipstick: NEGATIVE
Ketones, ur: NEGATIVE mg/dL
Leukocytes,Ua: NEGATIVE
Nitrite: NEGATIVE
Protein, ur: NEGATIVE mg/dL
Specific Gravity, Urine: >1.046 — ABNORMAL HIGH (ref 1.005–1.030)
pH: 5 (ref 5.0–8.0)

## 2022-03-15 LAB — LACTIC ACID, PLASMA
Lactic Acid, Venous: 0.9 mmol/L (ref 0.5–1.9)
Lactic Acid, Venous: 1.8 mmol/L (ref 0.5–1.9)

## 2022-03-15 LAB — CREATININE, SERUM
Creatinine, Ser: 0.79 mg/dL (ref 0.61–1.24)
GFR, Estimated: >60 mL/min (ref >60)

## 2022-03-15 LAB — LIPASE, BLOOD: Lipase: 36 U/L (ref 11–51)

## 2022-03-15 MED ORDER — VANCOMYCIN HCL 1750 MG/350ML IV SOLN
1750.0000 mg | Freq: Two times a day (BID) | INTRAVENOUS | Status: DC
Start: 2022-03-15 — End: 2022-03-15
  Filled 2022-03-15: qty 350

## 2022-03-15 MED ORDER — ACETAMINOPHEN 500 MG PO TABS
1000.0000 mg | ORAL_TABLET | Freq: Three times a day (TID) | ORAL | Status: DC
  Administered 2022-03-15 – 2022-03-30 (×43): 1000 mg via ORAL
  Filled 2022-03-15 (×44): qty 2

## 2022-03-15 MED ORDER — GADOBUTROL 1 MMOL/ML IV SOLN
7.5000 mL | Freq: Once | INTRAVENOUS | Status: AC | PRN
  Administered 2022-03-15: 7.5 mL via INTRAVENOUS

## 2022-03-15 MED ORDER — MORPHINE SULFATE (PF) 2 MG/ML IV SOLN
2.0000 mg | INTRAVENOUS | Status: DC | PRN
  Administered 2022-03-15 – 2022-03-19 (×16): 2 mg via INTRAVENOUS
  Filled 2022-03-15 (×17): qty 1

## 2022-03-15 MED ORDER — SODIUM CHLORIDE 0.9 % IV SOLN
2.0000 g | Freq: Three times a day (TID) | INTRAVENOUS | Status: DC
  Administered 2022-03-15: 2 g via INTRAVENOUS
  Filled 2022-03-15: qty 2
  Filled 2022-03-15: qty 12.5

## 2022-03-15 MED ORDER — ACETAMINOPHEN 650 MG RE SUPP: 650.0000 mg | Freq: Four times a day (QID) | RECTAL | Status: DC | PRN

## 2022-03-15 MED ORDER — ACETAMINOPHEN 325 MG PO TABS
650.0000 mg | ORAL_TABLET | Freq: Four times a day (QID) | ORAL | Status: DC | PRN
  Administered 2022-03-15: 650 mg via ORAL
  Filled 2022-03-15: qty 2

## 2022-03-15 MED ORDER — MORPHINE SULFATE (PF) 4 MG/ML IV SOLN
4.0000 mg | Freq: Once | INTRAVENOUS | Status: AC
  Administered 2022-03-15: 4 mg via INTRAVENOUS
  Filled 2022-03-15: qty 1

## 2022-03-15 MED ORDER — IOHEXOL 300 MG/ML  SOLN
100.0000 mL | Freq: Once | INTRAMUSCULAR | Status: AC | PRN
  Administered 2022-03-15: 100 mL via INTRAVENOUS

## 2022-03-15 MED ORDER — ONDANSETRON HCL 4 MG/2ML IJ SOLN: 4.0000 mg | Freq: Four times a day (QID) | INTRAMUSCULAR | Status: DC | PRN

## 2022-03-15 MED ORDER — IOHEXOL 300 MG/ML  SOLN: 100.0000 mL | Freq: Once | INTRAMUSCULAR | Status: DC | PRN

## 2022-03-15 MED ORDER — OXYCODONE HCL 5 MG PO TABS
5.0000 mg | ORAL_TABLET | Freq: Four times a day (QID) | ORAL | Status: DC | PRN
  Administered 2022-03-15 – 2022-03-18 (×11): 10 mg via ORAL
  Filled 2022-03-15 (×11): qty 2

## 2022-03-15 MED ORDER — VANCOMYCIN HCL 1250 MG/250ML IV SOLN
1250.0000 mg | Freq: Three times a day (TID) | INTRAVENOUS | Status: DC
  Administered 2022-03-15: 1250 mg via INTRAVENOUS
  Filled 2022-03-15 (×2): qty 250

## 2022-03-15 MED ORDER — SODIUM CHLORIDE 0.9 % IV SOLN
12.0000 g | INTRAVENOUS | Status: DC
  Administered 2022-03-15 – 2022-03-27 (×13): 12 g via INTRAVENOUS
  Filled 2022-03-15 (×15): qty 48

## 2022-03-15 MED ORDER — ONDANSETRON HCL 4 MG PO TABS: 4.0000 mg | ORAL_TABLET | Freq: Four times a day (QID) | ORAL | Status: DC | PRN

## 2022-03-15 MED ORDER — ONDANSETRON HCL 4 MG/2ML IJ SOLN
4.0000 mg | Freq: Once | INTRAMUSCULAR | Status: AC
  Administered 2022-03-15: 4 mg via INTRAVENOUS
  Filled 2022-03-15: qty 2

## 2022-03-15 MED ORDER — CEFEPIME HCL 2 G IV SOLR
2.0000 g | Freq: Once | INTRAVENOUS | Status: AC
  Administered 2022-03-15: 2 g via INTRAVENOUS
  Filled 2022-03-15: qty 12.5

## 2022-03-15 MED ORDER — HYDROMORPHONE HCL 1 MG/ML IJ SOLN: 0.5000 mg | Freq: Once | INTRAMUSCULAR | Status: DC

## 2022-03-15 MED ORDER — MORPHINE SULFATE (PF) 2 MG/ML IV SOLN
2.0000 mg | INTRAVENOUS | Status: DC | PRN
  Administered 2022-03-15 (×2): 2 mg via INTRAVENOUS
  Filled 2022-03-15 (×2): qty 1

## 2022-03-15 MED ORDER — VANCOMYCIN HCL IN DEXTROSE 1-5 GM/200ML-% IV SOLN
1000.0000 mg | Freq: Once | INTRAVENOUS | Status: AC
Start: 2022-03-15 — End: 2022-03-15
  Administered 2022-03-15: 1000 mg via INTRAVENOUS
  Filled 2022-03-15: qty 200

## 2022-03-15 MED ORDER — LACTATED RINGERS IV BOLUS: 1000.0000 mL | Freq: Once | INTRAVENOUS | Status: DC

## 2022-03-15 MED ORDER — ENOXAPARIN SODIUM 40 MG/0.4ML IJ SOSY
40.0000 mg | PREFILLED_SYRINGE | INTRAMUSCULAR | Status: AC
  Administered 2022-03-15 – 2022-03-23 (×9): 40 mg via SUBCUTANEOUS
  Filled 2022-03-15 (×10): qty 0.4

## 2022-03-15 MED ORDER — GABAPENTIN 300 MG PO CAPS
300.0000 mg | ORAL_CAPSULE | Freq: Three times a day (TID) | ORAL | Status: DC
  Administered 2022-03-15 – 2022-03-16 (×4): 300 mg via ORAL
  Filled 2022-03-15 (×4): qty 1

## 2022-03-15 MED ORDER — HYDROMORPHONE HCL 1 MG/ML IJ SOLN
1.0000 mg | Freq: Once | INTRAMUSCULAR | Status: AC
  Administered 2022-03-15: 1 mg via INTRAVENOUS
  Filled 2022-03-15: qty 1

## 2022-03-15 NOTE — Assessment & Plan Note (Addendum)
Will start Suboxone tomorrow morning.  Will spread out IV Dilaudid today to every 6 hours.  Continue oxycodone today.  Will need to get rid of pain medications at 1 AM in order to do the Suboxone in the morning.

## 2022-03-15 NOTE — H&P (Signed)
History and Physical    Patient: Derek Dunn TFT:[id number] DOB: 1971-01-31 DOA: 03/15/2022 DOS: the patient was seen and examined on 03/15/2022 PCP: Pcp, No  Patient coming from:  [place]  Chief Complaint:  Chief Complaint  Patient presents with   Groin Pain    HPI: [name] is a 51 y.o. male with medical history significant for Polysubstance use disorder including IV drug use, housing insecurity but incarcerated for the past 2 months, hospitalized back in May 2023 with MSSA bacteremia and spinal osteomyelitis/epidural abscesses as well as recurrent right inguinal hernia, signing out AMA x2 who was brought in by corrective officers from the [facility] jail with a complaint of acute worsening of low back pain and recurrent right groin pain.  He has had no fevers. ED course and data review: Vitals within normal limits Labs WBC 6100 with hemoglobin 11.3, lactic acid 0.9, BMP unremarkable, hepatic function unremarkable and lipase normal at 36  Imaging: MRI lumbar and thoracic spine significant principally for the following: Acute osteomyelitis discitis and facet joint septic arthritis at T8-9 with epidural abscess Severe spinal stenosis at T8-T9 Acute septic arthritis at the left L5-S1 facet (Please see detailed reports below)  Patient started on cefepime and vancomycin and treated with IV Dilaudid Additionally he had reduction in his right inguinal hernia Hospitalist consulted for admission.    Past Medical History:  Diagnosis Date   Endocarditis    Hyperlipidemia    No past surgical history on file. Social History:  reports that he has been smoking cigarettes. He has a 15.00 pack-year smoking history. He has never used smokeless tobacco. He reports current drug use. Drugs: IV and Methamphetamines. He reports that he does not drink alcohol.  No Known Allergies  No family history on file.  Prior to Admission medications   Not on File    Physical Exam: Vitals:    03/14/22 2134 03/15/22 0059 03/15/22 0400  BP: 132/85 122/83 131/88  Pulse: 75 69 70  Resp: 16 17 16   Temp: 98.3 F (36.8 C)    TempSrc: Oral    SpO2: 100% 98% 96%   Physical Exam Vitals and nursing note reviewed.  Constitutional:      General: He is not in acute distress. HENT:     Head: Normocephalic and atraumatic.  Cardiovascular:     Rate and Rhythm: Normal rate and regular rhythm.     Heart sounds: Normal heart sounds.  Pulmonary:     Effort: Pulmonary effort is normal.     Breath sounds: Normal breath sounds.  Abdominal:     Palpations: Abdomen is soft.     Tenderness: There is no abdominal tenderness.  Neurological:     Mental Status: Mental status is at baseline.     Labs on Admission: I have personally reviewed following labs and imaging studies  CBC: Recent Labs  Lab 03/14/22 2136  WBC 6.1  NEUTROABS 4.4  HGB 11.3*  HCT 37.0*  MCV 79.7*  PLT 386   Basic Metabolic Panel: Recent Labs  Lab 03/14/22 2136  NA 139  K 4.5  CL 105  CO2 30  GLUCOSE 124*  BUN 15  CREATININE 0.73  CALCIUM 9.1   GFR: CrCl cannot be calculated (Unknown ideal weight.). Liver Function Tests: Recent Labs  Lab 03/14/22 2136  AST 13*  ALT 12  ALKPHOS 82  BILITOT 0.5  PROT 7.8  ALBUMIN 3.3*   Recent Labs  Lab 03/14/22 2136  LIPASE 36   No results  for input(s): "AMMONIA" in the last 168 hours. Coagulation Profile: No results for input(s): "INR", "PROTIME" in the last 168 hours. Cardiac Enzymes: No results for input(s): "CKTOTAL", "CKMB", "CKMBINDEX", "TROPONINI" in the last 168 hours. BNP (last 3 results) No results for input(s): "PROBNP" in the last 8760 hours. HbA1C: No results for input(s): "HGBA1C" in the last 72 hours. CBG: No results for input(s): "GLUCAP" in the last 168 hours. Lipid Profile: No results for input(s): "CHOL", "HDL", "LDLCALC", "TRIG", "CHOLHDL", "LDLDIRECT" in the last 72 hours. Thyroid Function Tests: No results for input(s):  "TSH", "T4TOTAL", "FREET4", "T3FREE", "THYROIDAB" in the last 72 hours. Anemia Panel: No results for input(s): "VITAMINB12", "FOLATE", "FERRITIN", "TIBC", "IRON", "RETICCTPCT" in the last 72 hours. Urine analysis:    Component Value Date/Time   COLORURINE YELLOW (A) 01/04/2022 2051   APPEARANCEUR HAZY (A) 01/04/2022 2051   LABSPEC 1.025 01/04/2022 2051   PHURINE 6.0 01/04/2022 2051   GLUCOSEU NEGATIVE 01/04/2022 2051   HGBUR SMALL (A) 01/04/2022 2051   BILIRUBINUR NEGATIVE 01/04/2022 2051   KETONESUR NEGATIVE 01/04/2022 2051   PROTEINUR 30 (A) 01/04/2022 2051   NITRITE POSITIVE (A) 01/04/2022 2051   LEUKOCYTESUR NEGATIVE 01/04/2022 2051    Radiological Exams on Admission: MR Lumbar Spine W Wo Contrast  Result Date: 03/15/2022 CLINICAL DATA:  Initial evaluation for epidural abscess suspected, MSSA sepsis. EXAM: MRI LUMBAR SPINE WITHOUT AND WITH CONTRAST TECHNIQUE: Multiplanar and multiecho pulse sequences of the lumbar spine were obtained without and with intravenous contrast. CONTRAST:  7.265mL GADAVIST GADOBUTROL 1 MMOL/ML IV SOLN COMPARISON:  Prior MRI from 01/06/2022. FINDINGS: Segmentation: Standard. Lowest well-formed disc space labeled the L5-S1 level. Alignment: Trace degenerative retrolisthesis of L1 on L2 and L2 on L3. Alignment otherwise normal with preservation of the normal lumbar lordosis. Vertebrae: Persistent findings of acute septic arthritis seen involving the left L5-S1 facet. Associated prominent reactive marrow edema and enhancement and small joint effusion. No significant associated epidural component. Inflammatory changes within the adjacent posterior paraspinous soft tissues, with no discrete soft tissue abscess seen on today's exam. No other evidence for acute infection elsewhere within the lumbar spine. Vertebral body height maintained without acute or chronic fracture. Bone marrow signal intensity mildly heterogeneous but overall within normal limits. No worrisome  osseous lesions. Conus medullaris and cauda equina: Conus extends to the T12-L1 level. Conus and cauda equina appear normal. Paraspinal and other soft tissues: Inflammatory changes adjacent to the L5-S1 facet. No discrete soft tissue abscess. Paraspinous soft tissues demonstrate no other acute finding. Few small simple right renal cysts noted, benign in appearance, no follow-up imaging recommended. Disc levels: L1-2: Degenerative intervertebral disc space narrowing with diffuse disc bulge. Reactive endplate spurring. Mild facet hypertrophy. No significant spinal stenosis. Foramina remain patent. L2-3: Mild disc bulge with disc desiccation. Mild bilateral facet hypertrophy. Resultant mild bilateral subarticular stenosis. Central canal remains patent. Mild bilateral L2 foraminal narrowing. L3-4: Mild diffuse disc bulge with disc desiccation. Mild bilateral facet hypertrophy. No significant spinal stenosis. Mild right with mild to moderate left L3 foraminal stenosis. L4-5: Mild disc bulge with disc desiccation. Central annular fissure. Mild facet hypertrophy. No significant spinal stenosis. Mild to moderate bilateral L4 foraminal stenosis, worse on the left. L5-S1: Negative interspace. Findings of acute septic arthritis at the left L5-S1 facet. Moderate facet arthrosis about the contralateral right facet. Probable 5 mm synovial cyst at the posterior aspect of the right L5-S1 facet. No canal or lateral recess stenosis. Foramina remain patent. IMPRESSION: 1. Persistent findings of acute septic arthritis  at the left L5-S1 facet. No associated soft tissue abscess or significant epidural extension. 2. No other evidence for new or distant infection elsewhere within the lumbar spine. 3. Underlying multilevel lumbar spondylosis without significant spinal stenosis. Mild to moderate bilateral L2 through L4 foraminal stenosis as above. Electronically Signed   By: Rise Mu M.D.   On: 03/15/2022 03:28   CT Abdomen  Pelvis W Contrast  Result Date: 03/15/2022 CLINICAL DATA:  Right groin pain EXAM: CT ABDOMEN AND PELVIS WITH CONTRAST TECHNIQUE: Multidetector CT imaging of the abdomen and pelvis was performed using the standard protocol following bolus administration of intravenous contrast. RADIATION DOSE REDUCTION: This exam was performed according to the departmental dose-optimization program which includes automated exposure control, adjustment of the mA and/or kV according to patient size and/or use of iterative reconstruction technique. CONTRAST:  OMNIPAQUE IOHEXOL 300 MG/ML  SOLN COMPARISON:  CT abdomen/pelvis dated 01/04/2022. Concurrent MR thoracolumbar spine dated 03/15/2022. FINDINGS: Lower chest: Lung bases are clear. Hepatobiliary: Liver is within normal limits. Gallbladder is unremarkable. No hepatic or extrahepatic ductal dilatation. Pancreas: Within normal limits. Spleen: Within normal limits. Adrenals/Urinary Tract: Adrenal glands are within normal limits. Subcentimeter cyst in the posterior right kidney (series 2/image 35). Excretory contrast in the bilateral renal collecting systems. No hydronephrosis. Bladder is notable for excretory contrast. Stomach/Bowel: Stomach is within normal limits. Multiple dilated loops of small bowel with transition point entering a right inguinal hernia. Mildly thick-walled loop of distal ileum within the hernia (series 2/image 91), with associated moderate fluid in the hernia sac (series 2/image 95). While there is no pneumatosis or free air, this appearance suggests at risk bowel, and raises concern for incarceration. Colon is not decompressed. Vascular/Lymphatic: No evidence of abdominal aortic aneurysm. Atherosclerotic calcifications of the abdominal aorta and branch vessels. No suspicious abdominopelvic lymphadenopathy. Reproductive: Small volume pelvic ascites. Other: Moderate right inguinal hernia, as described above. This is progressive from the prior, previously a  small fat containing hernia. Tiny fat containing left inguinal hernia (series 2/image 88), unchanged. Musculoskeletal: Destructive changes at T8-9, with prominent paravertebral soft tissue (series 2/image 5). This is better evaluated on concurrent MR thoracolumbar spine. Mild degenerative changes of the lumbar spine. IMPRESSION: Moderate right inguinal hernia, progressive, now containing a thick-walled loop of terminal ileum and fluid. This appearance suggests at risk bowel and raises concern for incarceration. Surgical consultation is suggested. Destructive changes at T8-9 with prominent paravertebral soft tissue. This is better evaluated on concurrent MR thoracolumbar spine. Refer to dedicated MR thoracic spine report for additional information. Electronically Signed   By: Charline Bills M.D.   On: 03/15/2022 03:24   MR THORACIC SPINE W WO CONTRAST  Result Date: 03/15/2022 CLINICAL DATA:  Initial evaluation for back pain, epidural abscess suspected. EXAM: MRI THORACIC WITHOUT AND WITH CONTRAST TECHNIQUE: Multiplanar and multiecho pulse sequences of the thoracic spine were obtained without and with intravenous contrast. CONTRAST:  7.48mL GADAVIST GADOBUTROL 1 MMOL/ML IV SOLN COMPARISON:  Prior study from 01/06/2022. FINDINGS: Alignment: Slight focal kyphotic angulation of the normal thoracic kyphosis at the T8-9 level. No listhesis. Vertebrae: New abnormal marrow edema and enhancement seen within the T8-9 interspace. Associated endplate irregularity and fragmentation with vertebral body height loss within the adjacent T8 and T9 vertebral bodies. Findings consistent with acute osteomyelitis discitis, new from 01/06/2022. There is near complete disc space height loss with slight focal kyphotic angulation of the thoracic spine at this level. Surrounding paraspinous edema and phlegmonous change. Superimposed multi loculated collections seen  adjacent to the T8-9 interspace, largest of which measures approximately  1.4 cm on the right (series 13, image 28). Additionally, there is abnormal enhancement within the epidural space extending from T7 through T10, consistent with epidural phlegmon/abscess. Resultant severe spinal stenosis at the level of T8-9, with the thecal sac measuring 5-6 mm in AP diameter at its most narrow point. Cord is somewhat flattens without appreciable cord signal changes at this time. Inflammatory changes extend to involve the right T8-9 facet as well, likely reflecting concomitant septic arthritis. Moderate to severe bilateral foraminal narrowing noted at T8-9 and T9-10 as well. Otherwise, no other evidence for acute infection elsewhere within the thoracic spine. Mild chronic height loss noted at the superior endplates of T3 through T5. Underlying bone marrow signal intensity is somewhat heterogeneous but overall within normal limits. No worrisome osseous lesions. Cord: Severe spinal stenosis with mild cord flattening at the level of T8-9 as above. No visible cord signal changes. Otherwise normal signal morphology elsewhere within the thoracic spinal cord. Paraspinal and other soft tissues: Paraspinous inflammatory changes with multiloculated collections adjacent to the T8-9 interspace as above. No other acute finding. Disc levels: No significant disc pathology seen elsewhere within the thoracic spine. No other significant spinal stenosis. Foramina otherwise remain patent. IMPRESSION: 1. Findings consistent with acute osteomyelitis discitis at T8-9 with associated epidural phlegmon/abscess as above. Resultant severe spinal stenosis with cord flattening at this level, but no definite cord signal changes at this time. 2. Extension to involve the right T8-9 facet with evidence of concomitant septic arthritis. 3. Inflammatory changes involving the paraspinous soft tissues adjacent to T8-9 interspace with multiloculated soft tissue collections as above. Electronically Signed   By: Rise Mu M.D.    On: 03/15/2022 03:15     Data Reviewed: Relevant notes from primary care and specialist visits, past discharge summaries as available in EHR, including Care Everywhere. Prior diagnostic testing as pertinent to current admission diagnoses Updated medications and problem lists for reconciliation ED course, including vitals, labs, imaging, treatment and response to treatment Triage notes, nursing and pharmacy notes and ED provider's notes Notable results as noted in HPI   Assessment and Plan: * Epidural abscess Acute osteomyelitis discitis and facet joint septic arthritis at T8-9 with epidural abscess Severe spinal stenosis at T8-T9 Acute septic arthritis at the left L5-S1 facet History of IV drug use Continue cefepime, vancomycin Pain control   Right inguinal hernia S/p reduction in the ED Pain control Surgical consulted if recurrent        DVT prophylaxis: Lovenox  Consults: none  Advance Care Planning:   Code Status: Prior   Family Communication: none  Disposition Plan: Back to previous home environment  Severity of Illness: The appropriate patient status for this patient is INPATIENT. Inpatient status is judged to be reasonable and necessary in order to provide the required intensity of service to ensure the patient's safety. The patient's presenting symptoms, physical exam findings, and initial radiographic and laboratory data in the context of their chronic comorbidities is felt to place them at high risk for further clinical deterioration. Furthermore, it is not anticipated that the patient will be medically stable for discharge from the hospital within 2 midnights of admission.   * I certify that at the point of admission it is my clinical judgment that the patient will require inpatient hospital care spanning beyond 2 midnights from the point of admission due to high intensity of service, high risk for further deterioration and high frequency  of surveillance  required.*  Author: Andris Baumann, MD 03/15/2022 4:31 AM  For on call review [email or web address].

## 2022-03-15 NOTE — Assessment & Plan Note (Signed)
Management as outlined 

## 2022-03-15 NOTE — Consult Note (Signed)
NAME: Derek Dunn  DOB: 1970/10/15  MRN: [id number]  Date/Time: 03/15/2022 8:28 PM  REQUESTING PROVIDER: Dr.Griffith Subjective:  REASON FOR CONSULT: vertebral osteomyelitis ? Derek Dunn is a 51 y.o. male with a history of IVDA,MSSA bacteremia in May 2023 , left L5-S1 facet septic arthritis , epidural and paraspinal abscess not treated as he left AMA and was incarcerated  for the past 2 months on the day he left AMA, presented from the prison with rt groin and severe back pain.  Patient states he has right inguinal hernia which can be easily reducible.  But he has had back pain both lower and mid back pain for the past 2 months.  He states lying on the steel bench in the prison made it worse No fever or chills No bladder or bowel incontinence No weakness legs HE has been released from the prison today so the officers are no longer in his room Vitals in the ED showed BP 132/85, Temp 98.3, Pulse 75 and stas 100% Wbc 6.1, HB 11.3, PLT 386 Blood culture sent  MRI showed L5-S1 septic arthritis and a new infection at T8-T9 with associated epidural phlegmon/abscess IR aspirated the thoracic Paraspinous abscess today and I am seeing the patient for the same   PMH- HEPC- spontaneous resolution HLD  PSH none  Social History   Socioeconomic History   Marital status: Married    Spouse name: Not on file   Number of children: Not on file   Years of education: Not on file   Highest education level: Not on file  Occupational History   Not on file  Tobacco Use   Smoking status: Every Day    Packs/day: 1.00    Years: 15.00    Total pack years: 15.00    Types: Cigarettes   Smokeless tobacco: Never  Vaping Use   Vaping Use: Never used  Substance and Sexual Activity   Alcohol use: No   Drug use: Yes    Types: IV, Methamphetamines    Comment: suboxone, last heroin use 3 days ago   Sexual activity: Not on file  Other Topics Concern   Not on file  Social History Narrative   Not  on file   Social Determinants of Health   Financial Resource Strain: Not on file  Food Insecurity: Not on file  Transportation Needs: Not on file  Physical Activity: Not on file  Stress: Not on file  Social Connections: Not on file  Intimate Partner Violence: Not on file    Family History Mom died of OD Father died of CAD  No Known Allergies I? Current Facility-Administered Medications  Medication Dose Route Frequency Provider Last Rate Last Admin   acetaminophen (TYLENOL) tablet 1,000 mg  1,000 mg Oral Q8H Griffith, Kelly A, DO       enoxaparin (LOVENOX) injection 40 mg  40 mg Subcutaneous Q24H Judd Gaudier V, MD   40 mg at 03/15/22 0724   gabapentin (NEURONTIN) capsule 300 mg  300 mg Oral TID Athena Masse, MD   300 mg at 03/15/22 1743   lactated ringers bolus 1,000 mL  1,000 mL Intravenous Once Blake Divine, MD   Held at 03/15/22 0151   morphine (PF) 2 MG/ML injection 2 mg  2 mg Intravenous Q4H PRN Nicole Kindred A, DO   2 mg at 03/15/22 1739   nafcillin 12 g in sodium chloride 0.9 % 500 mL continuous infusion  12 g Intravenous Q24H Tsosie Billing, MD 20.8 mL/hr at 03/15/22 1900  Infusion Verify at 03/15/22 1900   ondansetron (ZOFRAN) tablet 4 mg  4 mg Oral Q6H PRN Athena Masse, MD       Or   ondansetron [facility]) injection 4 mg  4 mg Intravenous Q6H PRN Athena Masse, MD       oxyCODONE (Oxy IR/ROXICODONE) immediate release tablet 5-10 mg  5-10 mg Oral Q6H PRN Nicole Kindred A, DO   10 mg at 03/15/22 1446     Abtx:  Anti-infectives (From admission, onward)    Start     Dose/Rate Route Frequency Ordered Stop   03/15/22 1630  nafcillin 12 g in sodium chloride 0.9 % 500 mL continuous infusion        12 g 20.8 mL/hr over 24 Hours Intravenous Every 24 hours 03/15/22 1532     03/15/22 1500  vancomycin (VANCOREADY) IVPB 1750 mg/350 mL  Status:  Discontinued        1,750 mg 175 mL/hr over 120 Minutes Intravenous Every 12 hours 03/15/22 1004 03/15/22 1532    03/15/22 1300  ceFEPIme (MAXIPIME) 2 g in sodium chloride 0.9 % 100 mL IVPB  Status:  Discontinued        2 g 200 mL/hr over 30 Minutes Intravenous Every 8 hours 03/15/22 0536 03/15/22 1532   03/15/22 0630  vancomycin (VANCOREADY) IVPB 1250 mg/250 mL  Status:  Discontinued        1,250 mg 166.7 mL/hr over 90 Minutes Intravenous Every 8 hours 03/15/22 0536 03/15/22 1004   03/15/22 0330  ceFEPIme (MAXIPIME) 2 g in sodium chloride 0.9 % 100 mL IVPB        2 g 200 mL/hr over 30 Minutes Intravenous  Once 03/15/22 0325 03/15/22 0420   03/15/22 0330  vancomycin (VANCOCIN) IVPB 1000 mg/200 mL premix        1,000 mg 200 mL/hr over 60 Minutes Intravenous  Once 03/15/22 0325 03/15/22 0526       REVIEW OF SYSTEMS:  Const: negative fever, negative chills, negative weight loss Eyes: negative diplopia or visual changes, negative eye pain ENT: negative coryza, negative sore throat Resp: negative cough, hemoptysis, dyspnea Cards: negative for chest pain, palpitations, lower extremity edema GU: negative for frequency, dysuria and hematuria GI: Negative for abdominal pain, diarrhea, bleeding, constipation Skin: negative for rash and pruritus Heme: negative for easy bruising and gum/nose bleeding MS: severe mid back pain radiating to the front Neurolo:negative for headaches, dizziness, vertigo, memory problems  Psych: negative for feelings of anxiety, depression  Endocrine: negative for thyroid, diabetes Allergy/Immunology- negative for any medication or food allergies  Objective:  VITALS:  BP 100/67 (BP Location: Right Arm)   Pulse 69   Temp (!) 97.4 F (36.3 C)   Resp 18   Ht _0  (1.93 m)   Wt 88.5 kg   SpO2 96%   BMI 23.74 kg/m   PHYSICAL EXAM:  General: Alert, cooperative, no distress, appears stated age.  Head: Normocephalic, without obvious abnormality, atraumatic. Eyes: Conjunctivae clear, anicteric sclerae. Pupils are equal ENT Nares normal. No drainage or sinus  tenderness. Lips, mucosa, and tongue normal. No Thrush Neck: Supple, symmetrical, no adenopathy, thyroid: non tender no carotid bruit and no JVD. Back: tenderness spine- thoracic  Lungs: Clear to auscultation bilaterally. No Wheezing or Rhonchi. No rales. Heart: Regular rate and rhythm, no murmur, rub or gallop. Abdomen: Soft, non-tender,not distended. Bowel sounds normal. Rt inguinal hernia- reducible Extremities: atraumatic, no cyanosis. No edema. No clubbing Skin: No rashes or lesions. Or bruising  Lymph: Cervical, supraclavicular normal. Neurologic: Grossly non-focal Pertinent Labs Lab Results CBC    Component Value Date/Time   WBC 6.7 03/15/2022 0500   RBC 4.22 03/15/2022 0500   HGB 10.4 (L) 03/15/2022 0500   HGB 15.4 03/02/2012 2036   HCT 34.0 (L) 03/15/2022 0500   HCT 44.9 03/02/2012 2036   PLT 363 03/15/2022 0500   PLT 181 03/02/2012 2036   MCV 80.6 03/15/2022 0500   MCV 85 03/02/2012 2036   MCH 24.6 (L) 03/15/2022 0500   MCHC 30.6 03/15/2022 0500   RDW 14.6 03/15/2022 0500   RDW 13.8 03/02/2012 2036   LYMPHSABS 1.1 03/14/2022 2136   MONOABS 0.4 03/14/2022 2136   EOSABS 0.1 03/14/2022 2136   BASOSABS 0.0 03/14/2022 2136       Latest Ref Rng & Units 03/15/2022    5:00 AM 03/14/2022    9:36 PM 01/08/2022    4:56 AM  CMP  Glucose 70 - 99 mg/dL  124  126   BUN 6 - 20 mg/dL  15  13   Creatinine 0.61 - 1.24 mg/dL 0.79  0.73  0.74   Sodium 135 - 145 mmol/L  139  141   Potassium 3.5 - 5.1 mmol/L  4.5  3.5   Chloride 98 - 111 mmol/L  105  105   CO2 22 - 32 mmol/L  30  27   Calcium 8.9 - 10.3 mg/dL  9.1  8.4   Total Protein 6.5 - 8.1 g/dL  7.8    Total Bilirubin 0.3 - 1.2 mg/dL  0.5    Alkaline Phos 38 - 126 U/L  82    AST 15 - 41 U/L  13    ALT 0 - 44 U/L  12        Microbiology: Recent Results (from the past 240 hour(s))  Culture, blood (routine x 2)     Status: None (Preliminary result)   Collection Time: 03/15/22  3:50 AM   Specimen: BLOOD  Result Value  Ref Range Status   Specimen Description BLOOD RIGTH [facility]  Final   Special Requests   Final    BOTTLES DRAWN AEROBIC AND ANAEROBIC Blood Culture adequate volume   Culture   Final    NO GROWTH <12 HOURS Performed at [facility], [address] [place], [place] [address]    Report Status PENDING  Incomplete  Culture, blood (routine x 2)     Status: None (Preliminary result)   Collection Time: 03/15/22  4:00 AM   Specimen: BLOOD  Result Value Ref Range Status   Specimen Description BLOOD RIGHT ANTECUBITAL  Final   Special Requests   Final    BOTTLES DRAWN AEROBIC AND ANAEROBIC Blood Culture adequate volume   Culture   Final    NO GROWTH <12 HOURS Performed at [facility], [address] [place], [place] [address]    Report Status PENDING  Incomplete    IMAGING RESULTS:  I have personally reviewed the films ? Impression/Recommendation ? ?T8-T9 verterbal osteomyelitis, discitis, paraspinal abscess Very likely this is due to MSSA as he had an untreated  MSSA bacteremia in  from 01/04/22 and 01/07/22 S/p aspiration of the paraspinal abscess L5-S1 septic arthritis Will need neurosurgical consult for the thoracic infection  He was incarcerated the same day he left AMA ( 01/08/22) and did not do any IVDA in the prison Currently on vanco and cefepime- change to Nafcillin If the culture from back doe snot grow MSSA then  would need empiric broad spectrum coverage-  Will check ESR and CRP  ? ___________________________________________________ Discussed with patient, requesting provider Note:  This document was prepared using Dragon voice recognition software and may include unintentional dictation errors.

## 2022-03-15 NOTE — Assessment & Plan Note (Signed)
Signs of septic arthritis at level of L5-S1 seen on MRI lumbar spine.  Management as outlined, see epidural abscess.

## 2022-03-15 NOTE — Assessment & Plan Note (Addendum)
-  repeat CT abd/pelvis on the 28th showed a persistent large right inguinal hernia with no evidence of bowel obstruction.  Advised he will need general surgery as outpatient.

## 2022-03-15 NOTE — Progress Notes (Signed)
Pharmacy Antibiotic Note  Derek Dunn is a 51 y.o. male admitted on 03/15/2022 with osteomyelitis.  Pharmacy has been consulted for Cefepime & Vancomycin dosing.  Plan: Cefepime 2 grams q8h per indication & renal fxn.  Pt given initial dose of Vancomycin x 1 Adjust vancomycin 1750 mg IV every 12 hours  Goal AUC 400-550. Expected AUC: 492.4 SCr used: 0.8  Pharmacy will continue to follow and will adjust abx dosing whenever warranted.  Weight: 88.5 kg (195 lb)  Temp (24hrs), Avg:98.3 F (36.8 C), Min:98.3 F (36.8 C), Max:98.3 F (36.8 C)  Recent Labs  Lab 03/14/22 2136 03/15/22 0351 03/15/22 0500 03/15/22 0609  WBC 6.1  --  6.7  --   CREATININE 0.73  --  0.79  --   LATICACIDVEN  --  0.9  --  1.8     Estimated Creatinine Clearance: 129.1 mL/min (by C-G formula based on SCr of 0.79 mg/dL).    No Known Allergies  Antimicrobials this admission: 7/12 Cefepime >>  7/12 Vancomycin >>   Microbiology results: 7/12 BCx: NG < 12 hours  Thank you for allowing pharmacy to be a part of this patient's care.  Elliot Gurney, PharmD Clinical Pharmacist  03/15/2022 10:03 AM

## 2022-03-15 NOTE — Assessment & Plan Note (Signed)
See epidural abscess for plan

## 2022-03-15 NOTE — Progress Notes (Signed)
Progress Note   Patient: Derek Dunn WVP:[id number] DOB: 10-Apr-1971 DOA: 03/15/2022     0 DOS: the patient was seen and examined on 03/15/2022   Brief hospital course: 51 year old male with past medical history of polysubstance abuse including IV drug use in the past, housing insecurity (incarcerated past 2 months, just released (as of 7/12), hospital admission in May 2023 with MSSA bacteremia secondary to spinal osteomyelitis and epidural abscess, recurrent right inguinal hernia, history of signing out AGAINST MEDICAL ADVICE.  He presented to the ED from [facility] on 03/15/2022 with complaints of worsening back pain and recurrent right groin pain.  No reported fevers.  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  Labs were mostly unremarkable including no leukocytosis and normal lactic acid.  Patient was started on broad-spectrum IV antibiotic coverage and admitted for further evaluation and management.  Interventional radiology was consulted for abscess aspiration for cultures.  Assessment and Plan: * Epidural abscess Acute osteomyelitis discitis and facet joint septic arthritis at T8-9 with epidural abscess. Severe spinal stenosis at T8-T9. Acute septic arthritis at the left L5-S1 facet. History of IV drug use. Prior history MSSA bacteremia. --Continue cefepime, vancomycin -- IR consult for image guided aspiration for culture --Infectious disease consulted, appreciate antibiotic recommendations --Pain control per orders --Monitor fever curve, CBC  Septic arthritis (HCC) Signs of septic arthritis at level of L5-S1 seen on MRI lumbar spine.  Management as outlined, see epidural abscess.  Discitis thoracic region See epidural abscess for plan  Back pain Secondary to spinal infection as outlined. -- Treat infection as outlined -- Pain control  as needed per orders: Scheduled Tylenol, scheduled gabapentin, as needed oxycodone, IV morphine for breakthrough pain  Vertebral osteomyelitis (HCC) Management as outlined  Right inguinal hernia S/p reduction in the ED.   --Pain control as needed --Monitor closely for signs of bowel obstruction or incarceration --Surgical consulted if recurrent        Subjective: Patient seen in the ED holding for bed this morning.  He reports ongoing back pain and asked for medications to be adjusted for better pain control.  Denies fevers or chills.  Says that he does not have to go back to jail was officially released earlier today.  Admits to previously leaving the hospital AGAINST MEDICAL ADVICE, with regrets and plans to stay this time.  Physical Exam: Vitals:   03/15/22 0730 03/15/22 0830 03/15/22 0900 03/15/22 1155  BP: 107/74 117/86 107/79 107/76  Pulse: 69 72 73 64  Resp: 13 15 (!) 21   Temp:    98.7 F (37.1 C)  TempSrc:      SpO2: 99% 97% 95% 99%  Weight:       General exam: awake, alert, no acute distress but intermittently appears uncomfortable with pain HEENT: atraumatic, clear conjunctiva, anicteric sclera, moist mucus membranes, hearing grossly normal  Respiratory system: CTAB, no wheezes, rales or rhonchi, normal respiratory effort. Cardiovascular system: normal S1/S2, RRR, no JVD, murmurs, rubs, gallops, no pedal edema.   Gastrointestinal system: soft, NT, ND, no HSM felt, +bowel sounds. Central nervous system: A&O x3. no gross focal neurologic deficits, normal speech Extremities: moves all, no edema, normal tone Skin: dry, intact, normal temperature Psychiatry: normal mood, congruent affect, judgement and insight appear normal   Data Reviewed:  Notable labs: Hemoglobin 10.4 from 11.3.  Repeat lactic acid normal 1.8.  Blood cultures no  growth to date.  Family Communication: None  Disposition: Status is: Inpatient Remains inpatient appropriate because: Severity of  illness remaining on empiric broad-spectrum IV antibiotics pending cultures   Planned Discharge Destination:  Home with home health versus SNF.  Anticipate need for long course IV antibiotics.    Time spent: 40 minutes  Author: Pennie Banter, DO 03/15/2022 2:03 PM  For on call review [email or web address].

## 2022-03-15 NOTE — Progress Notes (Signed)
Pharmacy Antibiotic Note  Derek Dunn is a 51 y.o. male admitted on 03/15/2022 with osteomyelitis.  Pharmacy has been consulted for Cefepime & Vancomycin dosing.  Plan: Cefepime 2 gm q8h per indication & renal fxn.  Pt given initial dose of Vancomycin x 1. Vancomycin 1250 mg IV Q 8 hrs.  Goal AUC 400-550. Expected AUC: 483.1 SCr used: 0.73  Pharmacy will continue to follow and will adjust abx dosing whenever warranted.  Weight: 88.5 kg (195 lb)  Temp (24hrs), Avg:98.3 F (36.8 C), Min:98.3 F (36.8 C), Max:98.3 F (36.8 C)  Recent Labs  Lab 03/14/22 2136 03/15/22 0351  WBC 6.1  --   CREATININE 0.73  --   LATICACIDVEN  --  0.9    Estimated Creatinine Clearance: 129.1 mL/min (by C-G formula based on SCr of 0.73 mg/dL).    No Known Allergies  Antimicrobials this admission: 7/12 Cefepime >>  7/12 Vancomycin >>   Microbiology results: 7/12 BCx: Pending  Thank you for allowing pharmacy to be a part of this patient's care.  Otelia Sergeant, PharmD, [facility] 03/15/2022 5:34 AM

## 2022-03-15 NOTE — ED Notes (Signed)
Pt in custody of [facility], pt in bilateral hand cuffs and ankle shackles. Pt c/o R groin pain.  No distress noted.

## 2022-03-15 NOTE — ED Provider Notes (Signed)
[facility] Provider Note    Event Date/Time   First MD Initiated Contact with Patient 03/15/22 0024     (approximate)   History   Chief Complaint Groin Pain   HPI  Derek Dunn is a 51 y.o. male with past medical history of IV drug use who presents to the ED complaining of groin and back pain.  Patient reports that he has been dealing with approximately 2 months of pain in his right groin as well as the middle of his upper and lower back.  He states that he has noticed a hernia in his right groin that intermittently causes him pain but then will reduce on its own.  Pain has been worse for the past couple of days, but patient states he is still able to reduce it.  He denies any associated nausea, vomiting, or changes in bowel movements.  He additionally complains of 2 months of ongoing pain starting in the middle of his upper and lower back, radiating out towards the sides and down both legs.  He denies any numbness or weakness in his legs, has not had any groin numbness, bowel or bladder incontinence.  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  Patient has been incarcerated for the past 2 months, states his last IV drug use was just prior to incarceration.     Physical Exam   Triage Vital Signs: ED Triage Vitals [03/14/22 2134]  Enc Vitals Group     BP 132/85     Pulse Rate 75     Resp 16     Temp 98.3 F (36.8 C)     Temp Source Oral     SpO2 100 %     Weight      Height      Head Circumference      Peak Flow      Pain Score      Pain Loc      Pain Edu?      Excl. in GC?     Most recent vital signs: Vitals:   03/15/22 0059 03/15/22 0400  BP: 122/83 131/88  Pulse: 69 70  Resp: 17 16  Temp:    SpO2: 98% 96%    Constitutional: Alert and oriented. Eyes: Conjunctivae  are normal. Head: Atraumatic. Nose: No congestion/rhinnorhea. Mouth/Throat: Mucous membranes are moist.  Cardiovascular: Normal rate, regular rhythm. Grossly normal heart sounds.  2+ radial and DP pulses bilaterally. Respiratory: Normal respiratory effort.  No retractions. Lungs CTAB. Gastrointestinal: Soft and diffusely tender to palpation with no rebound or guarding, right inguinal hernia noted that is partially reducible with associated tenderness. No distention. Musculoskeletal: No lower extremity tenderness nor edema.  Midline thoracic and lumbar spinal tenderness to palpation noted. Neurologic:  Normal speech and language. No gross focal neurologic deficits are appreciated.    ED Results / Procedures / Treatments   Labs (all labs ordered are listed, but only abnormal results are displayed) Labs Reviewed  CBC WITH DIFFERENTIAL/PLATELET - Abnormal; Notable for the following components:      Result Value   Hemoglobin 11.3 (*)    HCT 37.0 (*)    MCV 79.7 (*)    MCH 24.4 (*)    All other components within normal limits  BASIC METABOLIC PANEL - Abnormal; Notable for the following components:  Glucose, Bld 124 (*)    Anion gap 4 (*)    All other components within normal limits  HEPATIC FUNCTION PANEL - Abnormal; Notable for the following components:   Albumin 3.3 (*)    AST 13 (*)    All other components within normal limits  CULTURE, BLOOD (ROUTINE X 2)  CULTURE, BLOOD (ROUTINE X 2)  LIPASE, BLOOD  LACTIC ACID, PLASMA  URINALYSIS, ROUTINE W REFLEX MICROSCOPIC  LACTIC ACID, PLASMA   RADIOLOGY CT abdomen/pelvis reviewed and interpreted by me with large right inguinal hernia and associated edema concerning for incarceration.  PROCEDURES:  Critical Care performed: Yes, see critical care procedure note(s)  .Critical Care  Performed by: Chesley Noon, MD Authorized by: Chesley Noon, MD   Critical care provider statement:    Critical care time (minutes):  30    Critical care time was exclusive of:  Separately billable procedures and treating other patients and teaching time   Critical care was necessary to treat or prevent imminent or life-threatening deterioration of the following conditions:  Sepsis (Epidural abscess)   Critical care was time spent personally by me on the following activities:  Development of treatment plan with patient or surrogate, discussions with consultants, evaluation of patient's response to treatment, examination of patient, ordering and review of laboratory studies, ordering and review of radiographic studies, ordering and performing treatments and interventions, pulse oximetry, re-evaluation of patient's condition and review of old charts   I assumed direction of critical care for this patient from another provider in my specialty: no     Care discussed with: admitting provider      MEDICATIONS ORDERED IN ED: Medications  lactated ringers bolus 1,000 mL (0 mLs Intravenous Hold 03/15/22 0151)  vancomycin (VANCOCIN) IVPB 1000 mg/200 mL premix (1,000 mg Intravenous New Bag/Given 03/15/22 0421)  morphine (PF) 4 MG/ML injection 4 mg (4 mg Intravenous Given 03/15/22 0149)  ondansetron (ZOFRAN) injection 4 mg (4 mg Intravenous Given 03/15/22 0150)  gadobutrol (GADAVIST) 1 MMOL/ML injection 7.5 mL (7.5 mLs Intravenous Contrast Given 03/15/22 0246)  iohexol (OMNIPAQUE) 300 MG/ML solution 100 mL (100 mLs Intravenous Contrast Given 03/15/22 0254)  ceFEPIme (MAXIPIME) 2 g in sodium chloride 0.9 % 100 mL IVPB (0 g Intravenous Stopped 03/15/22 0420)  HYDROmorphone (DILAUDID) injection 1 mg (1 mg Intravenous Given 03/15/22 0356)     IMPRESSION / MDM / ASSESSMENT AND PLAN / ED COURSE  I reviewed the triage vital signs and the nursing notes.                              51 y.o. male with past medical history of IV drug use who presents to the ED complaining of 2 months of intermittent pain in his right groin with associated inguinal hernia as  well as 2 months of worsening pain in his midline thoracic and lumbar spine.  Patient's presentation is most consistent with acute presentation with potential threat to life or bodily function.  Differential diagnosis includes, but is not limited to, incarcerated hernia, strangulated hernia, reducible hernia, UTI, appendicitis, epidural abscess, cauda equina, spinal osteomyelitis, discitis, sepsis.  Patient nontoxic-appearing and in no acute distress, vital signs are unremarkable.  Patient has tenderness to palpation diffusely over his abdomen, greatest at the site of a right inguinal hernia that is partially reducible on my exam.  We will further assess with CT scan, labs thus far are reassuring with no significant anemia, leukocytosis, electrolyte abnormality,  or AKI.  LFTs, lipase, and urinalysis are pending, we will treat symptomatically with IV morphine and Zofran.  Additionally, patient had prior diagnosis of lumbar osteomyelitis with associated epidural abscess in May, did not receive adequate treatment at that time and continues to complain of pain in his back.  No focal neurologic deficits concerning for cauda equina, but we will reassess with MRI of his thoracic and lumbar spine for worsening infection.  Given reassuring vital signs with no signs of sepsis, we will hold off on antibiotics for now.  CT of abdomen/pelvis concerning for large right inguinal hernia with associated signs concerning for incarceration.  Patient given additional IV pain medication and hernia was fully reduced by myself, patient with significant improvement in associated pain afterwards.  MRI of thoracic and lumbar spine redemonstrates infection at lumbar spine with new osteomyelitis and small epidural abscess and thoracic spinal area.  Patient started on broad-spectrum antibiotics and case discussed with hospitalist for admission.      FINAL CLINICAL IMPRESSION(S) / ED DIAGNOSES   Final diagnoses:  Epidural abscess   Unilateral recurrent inguinal hernia without obstruction or gangrene     Rx / DC Orders   ED Discharge Orders     None        Note:  This document was prepared using Dragon voice recognition software and may include unintentional dictation errors.   Chesley Noon, MD 03/15/22 0430

## 2022-03-15 NOTE — TOC Initial Note (Signed)
Transition of Care [facility]) - Initial/Assessment Note    Patient Details  Name: Derek Dunn MRN: [id number] Date of Birth: 23-Sep-1970  [facility] of [facility] CM/SW Contact:    Chapman Fitch, RN Phone Number: 03/15/2022, 1:35 PM  Clinical Narrative:                  Patient admitted from [facility] jail.  Per note patient is no longer in custody.  - Acute osteomyelitis discitis and facet joint septic arthritis at T8-9 with epidural abscess - Patient admits to current IV drug use.  - If long term IV antibiotics are indicated, patient will need to remain inpatient for duration, unless something weekly outpatient is indicated        Patient Goals and CMS Choice        Expected Discharge Plan and Services                                                Prior Living Arrangements/Services                       Activities of Daily Living      Permission Sought/Granted                  Emotional Assessment              Admission diagnosis:  Epidural abscess [G06.2] Unilateral recurrent inguinal hernia without obstruction or gangrene [K40.91] Patient Active Problem List   Diagnosis Date Noted   Right inguinal hernia 03/15/2022   Osteomyelitis (HCC) 01/07/2022   Epidural abscess 01/07/2022   Homeless 01/05/2022   Tobacco use 01/05/2022   Polysubstance abuse (HCC) 01/05/2022   Left low back pain 01/05/2022   Bacteremia due to Staphylococcus aureus 01/05/2022   PCP:  Pcp, No Pharmacy:   [facility] [address], [place] - [address] [place] [address] Phone: [phone] Fax: [phone]     Social Determinants of Health (SDOH) Interventions    Readmission Risk Interventions     No data to display

## 2022-03-15 NOTE — Hospital Course (Addendum)
Derek Dunn is a 51 year old male with PMH polysubstance abuse including IV drug use in the past, housing insecurity (incarcerated past 2 months, just released (as of 7/12), hospital admission in May 2023 with MSSA bacteremia secondary to spinal osteomyelitis and epidural abscess, recurrent right inguinal hernia, history of signing out AMA.  He presented to the ED from [facility] on 03/15/2022 with complaints of worsening back pain and recurrent right groin pain.  No reported fevers.  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  Labs were mostly unremarkable including no leukocytosis and normal lactic acid.  Sepsis ruled out.  Patient was started on broad-spectrum IV antibiotic coverage and admitted for further evaluation and management.  Interventional radiology was consulted for abscess aspiration for cultures, performed on 7/12. Infectious disease was also consulted.   Neurosurgery consulted for consideration of washout in the OR, given poor likelihood of full resolution with antibiotics alone. He was taken to the OR for washout on 03/24/22.  Underwent posterior laminectomy T8-9 for evacuation of epidural abscess, posterolateral arthrodesis T7 to T12 with T7-12 posterior instrumentation.   The patient will remain in the hospital to 04/26/2022 for IV antibiotics.  Patient converted to Suboxone on 04/12/2022.  Patient had some altered mental status on 04/19/2022 to 04/20/2022.  Reviewed in nursing notes.  Urine toxicology positive for amphetamines and cannabis.  As of 04/24/2022 liver function test still rising.  Gastroenterology recommends IV fluids to see if this will help.  I discontinued gabapentin.  8/23.  Patient is adamant that he will leave by 12 noon today, GI has advised patient to stay for further monitoring, patient refused.  At this point, patient is  discharged with follow-ups.

## 2022-03-15 NOTE — Progress Notes (Signed)
Per officer, patient is no longer under custody of [facility]. Cuffs and jail uniform taken off by officer and personal clothes on.

## 2022-03-15 NOTE — Assessment & Plan Note (Addendum)
Acute osteomyelitis discitis and facet joint septic arthritis at T8-9 with epidural abscess. Severe spinal stenosis at T8-T9. Acute septic arthritis at the left L5-S1 facet. History of IV drug use. Prior history MSSA bacteremia. --Initially on IV Cefepime, Vancomycin -- IR consulted, performed CT-guided aspiration for culture on 7/12 --Infectious disease consulted  --Was on nafcillin continuous IV infusion --Antibiotic changed to cefazolin on 7/24 due to rising LFTs --Neurosurgery consulted.  Patient underwent T7-12 posterior instrumentation with decompression and washout of epidural abscess at T8-9 (performed on 03/24/22) -- Drain removed on 03/27/2022  -- Wound VAC removed 7/26 --Neurosurgery recommends: Mobilize, pain control, resume DVT prophylaxis, PT/OT -- Follow-up with neurosurgery in 2 weeks  7/28: per Neurosurgery, pt no longer needs dressing over incision.  May shower and pat incision area dry.  Patient will remain in the hospital for IV antibiotics through 04/26/2022.

## 2022-03-15 NOTE — ED Notes (Signed)
Pt to MRI at this time.

## 2022-03-15 NOTE — Procedures (Signed)
Vascular and Interventional Radiology Procedure Note  Patient: Derek Dunn DOB: [birth year] Medical Record Number: [id number] Note Date/Time: 03/15/22 9:31 AM   Performing Physician: Roanna Banning, MD Assistant(s): None  Diagnosis: T8 Osteomyelitis / Discitis  Procedure: ASPIRATION OF THORACIC PARASPINOUS ABSCESS  Anesthesia: Local Anesthetic Complications: None Estimated Blood Loss: Minimal Specimens: Sent for Gram Stain, Aerobe Culture, and Anerobe Culture  Findings:  Successful CT-guided aspiration of paraspinous abscess. 5 mL was aspirated and submitted to microbiology.   See detailed procedure note with images in PACS. The patient tolerated the procedure well without incident or complication and was returned to  ER  in stable condition.    Roanna Banning, MD [facility] Radiology   Pager. [phone] Clinic. [phone]

## 2022-03-16 DIAGNOSIS — M4644 Discitis, unspecified, thoracic region: Secondary | ICD-10-CM | POA: Diagnosis not present

## 2022-03-16 DIAGNOSIS — M4620 Osteomyelitis of vertebra, site unspecified: Secondary | ICD-10-CM | POA: Diagnosis not present

## 2022-03-16 DIAGNOSIS — I959 Hypotension, unspecified: Secondary | ICD-10-CM | POA: Diagnosis not present

## 2022-03-16 DIAGNOSIS — M4014 Other secondary kyphosis, thoracic region: Secondary | ICD-10-CM

## 2022-03-16 DIAGNOSIS — G062 Extradural and subdural abscess, unspecified: Secondary | ICD-10-CM | POA: Diagnosis not present

## 2022-03-16 DIAGNOSIS — M532X4 Spinal instabilities, thoracic region: Secondary | ICD-10-CM

## 2022-03-16 DIAGNOSIS — M4624 Osteomyelitis of vertebra, thoracic region: Secondary | ICD-10-CM | POA: Diagnosis not present

## 2022-03-16 LAB — CBC
HCT: 30.6 % — ABNORMAL LOW (ref 39.0–52.0)
Hemoglobin: 9.4 g/dL — ABNORMAL LOW (ref 13.0–17.0)
MCH: 24.8 pg — ABNORMAL LOW (ref 26.0–34.0)
MCHC: 30.7 g/dL (ref 30.0–36.0)
MCV: 80.7 fL (ref 80.0–100.0)
Platelets: 277 10*3/uL (ref 150–400)
RBC: 3.79 MIL/uL — ABNORMAL LOW (ref 4.22–5.81)
RDW: 14.6 % (ref 11.5–15.5)
WBC: 4.2 10*3/uL (ref 4.0–10.5)
nRBC: 0 % (ref 0.0–0.2)

## 2022-03-16 LAB — BASIC METABOLIC PANEL
Anion gap: 5 (ref 5–15)
BUN: 19 mg/dL (ref 6–20)
CO2: 27 mmol/L (ref 22–32)
Calcium: 8.3 mg/dL — ABNORMAL LOW (ref 8.9–10.3)
Chloride: 106 mmol/L (ref 98–111)
Creatinine, Ser: 0.98 mg/dL (ref 0.61–1.24)
GFR, Estimated: >60 mL/min (ref >60)
Glucose, Bld: 101 mg/dL — ABNORMAL HIGH (ref 70–99)
Potassium: 4.2 mmol/L (ref 3.5–5.1)
Sodium: 138 mmol/L (ref 135–145)

## 2022-03-16 LAB — SURGICAL PCR SCREEN
MRSA, PCR: NEGATIVE
Staphylococcus aureus: POSITIVE — AB

## 2022-03-16 LAB — MAGNESIUM: Magnesium: 2 mg/dL (ref 1.7–2.4)

## 2022-03-16 MED ORDER — GABAPENTIN 300 MG PO CAPS
600.0000 mg | ORAL_CAPSULE | Freq: Three times a day (TID) | ORAL | Status: DC
  Administered 2022-03-16 – 2022-03-20 (×12): 600 mg via ORAL
  Filled 2022-03-16 (×12): qty 2

## 2022-03-16 NOTE — H&P (View-Only) (Signed)
Referring Physician:  No referring provider defined for this encounter.  Primary Physician:  Pcp, No  History of Present Illness: 03/16/2022 Derek Dunn is here with a chief complaint of thoracic back pain. He was previously admitted with osteomyelitis in May but left AMA. He has MSSA at the time.  He presented this time with continued and worsening pain.  He denies fevers.  He denies any neurologic symptoms other than pain.  Review of Systems:  A 10 point review of systems is negative, except for the pertinent positives and negatives detailed in the HPI.  Past Medical History: Past Medical History:  Diagnosis Date   Endocarditis    Hyperlipidemia     Past Surgical History: History reviewed. No pertinent surgical history.  Allergies: Allergies as of 03/14/2022   (No Known Allergies)    Medications: No outpatient medications have been marked as taking for the 03/15/22 encounter [facility] Encounter).    Social History: Social History   Tobacco Use   Smoking status: Every Day    Packs/day: 1.00    Years: 15.00    Total pack years: 15.00    Types: Cigarettes   Smokeless tobacco: Never  Vaping Use   Vaping Use: Never used  Substance Use Topics   Alcohol use: No   Drug use: Yes    Types: IV, Methamphetamines    Comment: suboxone, last heroin use 3 days ago    Family Medical History: History reviewed. No pertinent family history.  Physical Examination: Vitals:   03/16/22 1326 03/16/22 1531  BP: 104/75 99/65  Pulse: 72 70  Resp:  18  Temp:  97.9 F (36.6 C)  SpO2:  96%    General: Patient is well developed, well nourished, calm, collected, and in no apparent distress. Attention to examination is appropriate.  Psychiatric: Patient is non-anxious.  Head:  Pupils equal, round, and reactive to light.  ENT:  Oral mucosa appears well hydrated.  Neck:   Supple.  Full range of motion.  Respiratory: Patient is breathing without any  difficulty.  Extremities: No edema.  Vascular: Palpable dorsal pedal pulses.  Skin:   On exposed skin, there are no abnormal skin lesions.  NEUROLOGICAL:     Awake, alert, oriented to person, place, and time.  Speech is clear and fluent. Fund of knowledge is appropriate.   Cranial Nerves: Pupils equal round and reactive to light.  Facial tone is symmetric.  Facial sensation is symmetric. Shoulder shrug is symmetric. Tongue protrusion is midline.  There is no pronator drift.   Strength: Side Biceps Triceps Deltoid Interossei Grip Wrist Ext. Wrist Flex.  R 5 5 5 5 5 5 5   L 5 5 5 5 5 5 5    Side Iliopsoas Quads Hamstring PF DF EHL  R 5 5 5 5 5 5   L 5 5 5 5 5 5    Reflexes are 1+ and symmetric at the biceps, triceps, brachioradialis, patella and achilles.   Hoffman's is absent.  Clonus is not present.  Toes are down-going.  Bilateral upper and lower extremity sensation is intact to light touch.    No evidence of dysmetria noted.  Gait is untested.    Medical Decision Making  Imaging: MR T spine 03/15/2022 IMPRESSION: 1. Findings consistent with acute osteomyelitis discitis at T8-9 with associated epidural phlegmon/abscess as above. Resultant severe spinal stenosis with cord flattening at this level, but no definite cord signal changes at this time. 2. Extension to involve the right T8-9 facet  with evidence of concomitant septic arthritis. 3. Inflammatory changes involving the paraspinous soft tissues adjacent to T8-9 interspace with multiloculated soft tissue collections as above.     Electronically Signed   By: Benjamin  McClintock M.D.   On: 03/15/2022 03:15  I have personally reviewed the images and agree with the above interpretation.  Assessment and Plan: [name] is a pleasant 51 y.o. 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  He is developing some kyphosis suggesting instability.  He is not frankly  myelopathic, but does have stenosis.  I do not recommend urgent surgery, but I would consider instrumentation for possible instability once he has had a few days of antibiotics.  We will touch base as he moves forward in treatment.  If his symptoms worsen or he develops weakness, I would consider surgery at that time.  I spent a total of 45 minutes in face-to-face and non-face-to-face activities related to this patient's care today.  Thank you for involving me in the care of this patient.   This note was partially dictated using voice recognition software, so please excuse any errors that were not corrected.   Heily Carlucci K. Yer Olivencia MD, MPHS Neurosurgery  

## 2022-03-16 NOTE — Progress Notes (Signed)
Progress Note   Patient: Derek Dunn JOI:[id number] DOB: [birth year] DOA: 03/15/2022     1 DOS: the patient was seen and examined on 03/16/2022   Brief hospital course: 51 year old male with past medical history of polysubstance abuse including IV drug use in the past, housing insecurity (incarcerated past 2 months, just released (as of 7/12), hospital admission in May 2023 with MSSA bacteremia secondary to spinal osteomyelitis and epidural abscess, recurrent right inguinal hernia, history of signing out AGAINST MEDICAL ADVICE.  He presented to the ED from [facility] on 03/15/2022 with complaints of worsening back pain and recurrent right groin pain.  No reported fevers.  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  Labs were mostly unremarkable including no leukocytosis and normal lactic acid.  Patient was started on broad-spectrum IV antibiotic coverage and admitted for further evaluation and management.  Interventional radiology was consulted for abscess aspiration for cultures, performed on 7/12.    Neurosurgery consulted for consideration of washout in the OR, given poor likelihood of full resolution with antibiotics alone.  Assessment and Plan: * Epidural abscess Acute osteomyelitis discitis and facet joint septic arthritis at T8-9 with epidural abscess. Severe spinal stenosis at T8-T9. Acute septic arthritis at the left L5-S1 facet. History of IV drug use. Prior history MSSA bacteremia. --Initially on IV Cefepime, Vancomycin -- IR consulted, performed CT-guided aspiration for culture on 7/12 --Infectious disease consulted, appreciate antibiotic recommendations.   --On nafcillin continuous IV infusion --Neurosurgery consulted, for consideration of surgical washout/intervention --Pain control per orders --Monitor fever curve, CBC  Septic  arthritis (HCC) Signs of septic arthritis at level of L5-S1 seen on MRI lumbar spine.  Management as outlined, see epidural abscess.  Discitis thoracic region See epidural abscess for plan  Back pain Secondary to spinal infection as outlined. -- Treat infection as outlined -- Pain control as needed per orders: Scheduled Tylenol, scheduled gabapentin, as needed oxycodone, IV morphine for breakthrough pain  Vertebral osteomyelitis (HCC) Management as outlined  Right inguinal hernia S/p reduction in the ED.   --Pain control as needed --Monitor closely for signs of bowel obstruction or incarceration --Surgical consulted if recurrent  Hypotension BP as low as 87/61 this AM (7/13), suspect due to pain medications.  Otherwise no signs of sepsis. --Maintain MAP > 65 with fluids if needed --Monitor closely        Subjective: Patient seen awake sitting up in bed today.  Reports ongoing pain in mid and lower back, radiates to his sides and down both legs.  Denies fever chills, dizziness or lightheadedness.  Asking for gabapentin to be increased, says last time they gave him 900 mg TID which he tolerated well.   Physical Exam: Vitals:   03/15/22 1959 03/16/22 0302 03/16/22 0746 03/16/22 1326  BP: 100/67 94/65 (!) 87/61 104/75  Pulse: 69 61 69 72  Resp: 18 18 18    Temp: (!) 97.4 F (36.3 C) 98 F (36.7 C) 98 F (36.7 C)   TempSrc:  Oral    SpO2: 96% 97% 98%   Weight:      Height:       General exam: awake, alert, no acute distress  HEENT: amoist mucus membranes, hearing grossly normal  Respiratory system: on room air, normal respiratory effort. Cardiovascular system: RRR, no peripheral edema.   Central nervous system: A&O x3. no gross focal neurologic deficits, normal speech Extremities: moves  all, no edema, normal tone Skin: dry, intact, no rashes seen on visualized skin Psychiatry: normal mood, congruent affect, judgement and insight appear normal   Data  Reviewed:  Notable labs:  glucose 101, Ca 8.3, Hbg 10.4 >> 9.4 Abscess culture growing staph aureus so far. Blood cultures negative to date  Family Communication: None  Disposition: Status is: Inpatient Remains inpatient appropriate because: Severity of illness remaining on empiric broad-spectrum IV antibiotics pending cultures   Planned Discharge Destination:  Home with home health versus SNF.  Anticipate need for long course IV antibiotics.    Time spent: 40 minutes  Author: Pennie Banter, DO 03/16/2022 2:14 PM  For on call review [email or web address].

## 2022-03-16 NOTE — Consult Note (Signed)
Referring Physician:  No referring provider defined for this encounter.  Primary Physician:  Pcp, No  History of Present Illness: 03/16/2022 Derek Dunn is here with a chief complaint of thoracic back pain. He was previously admitted with osteomyelitis in May but left AMA. He has MSSA at the time.  He presented this time with continued and worsening pain.  He denies fevers.  He denies any neurologic symptoms other than pain.  Review of Systems:  A 10 point review of systems is negative, except for the pertinent positives and negatives detailed in the HPI.  Past Medical History: Past Medical History:  Diagnosis Date   Endocarditis    Hyperlipidemia     Past Surgical History: History reviewed. No pertinent surgical history.  Allergies: Allergies as of 03/14/2022   (No Known Allergies)    Medications: No outpatient medications have been marked as taking for the 03/15/22 encounter [facility] Encounter).    Social History: Social History   Tobacco Use   Smoking status: Every Day    Packs/day: 1.00    Years: 15.00    Total pack years: 15.00    Types: Cigarettes   Smokeless tobacco: Never  Vaping Use   Vaping Use: Never used  Substance Use Topics   Alcohol use: No   Drug use: Yes    Types: IV, Methamphetamines    Comment: suboxone, last heroin use 3 days ago    Family Medical History: History reviewed. No pertinent family history.  Physical Examination: Vitals:   03/16/22 1326 03/16/22 1531  BP: 104/75 99/65  Pulse: 72 70  Resp:  18  Temp:  97.9 F (36.6 C)  SpO2:  96%    General: Patient is well developed, well nourished, calm, collected, and in no apparent distress. Attention to examination is appropriate.  Psychiatric: Patient is non-anxious.  Head:  Pupils equal, round, and reactive to light.  ENT:  Oral mucosa appears well hydrated.  Neck:   Supple.  Full range of motion.  Respiratory: Patient is breathing without any  difficulty.  Extremities: No edema.  Vascular: Palpable dorsal pedal pulses.  Skin:   On exposed skin, there are no abnormal skin lesions.  NEUROLOGICAL:     Awake, alert, oriented to person, place, and time.  Speech is clear and fluent. Fund of knowledge is appropriate.   Cranial Nerves: Pupils equal round and reactive to light.  Facial tone is symmetric.  Facial sensation is symmetric. Shoulder shrug is symmetric. Tongue protrusion is midline.  There is no pronator drift.   Strength: Side Biceps Triceps Deltoid Interossei Grip Wrist Ext. Wrist Flex.  R 5 5 5 5 5 5 5   L 5 5 5 5 5 5 5    Side Iliopsoas Quads Hamstring PF DF EHL  R 5 5 5 5 5 5   L 5 5 5 5 5 5    Reflexes are 1+ and symmetric at the biceps, triceps, brachioradialis, patella and achilles.   Hoffman's is absent.  Clonus is not present.  Toes are down-going.  Bilateral upper and lower extremity sensation is intact to light touch.    No evidence of dysmetria noted.  Gait is untested.    Medical Decision Making  Imaging: MR T spine 03/15/2022 IMPRESSION: 1. Findings consistent with acute osteomyelitis discitis at T8-9 with associated epidural phlegmon/abscess as above. Resultant severe spinal stenosis with cord flattening at this level, but no definite cord signal changes at this time. 2. Extension to involve the right T8-9 facet  with evidence of concomitant septic arthritis. 3. Inflammatory changes involving the paraspinous soft tissues adjacent to T8-9 interspace with multiloculated soft tissue collections as above.     Electronically Signed   By: Rise Mu M.D.   On: 03/15/2022 03:15  I have personally reviewed the images and agree with the above interpretation.  Assessment and Plan: Derek Dunn is a pleasant 51 y.o. 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  He is developing some kyphosis suggesting instability.  He is not frankly  myelopathic, but does have stenosis.  I do not recommend urgent surgery, but I would consider instrumentation for possible instability once he has had a few days of antibiotics.  We will touch base as he moves forward in treatment.  If his symptoms worsen or he develops weakness, I would consider surgery at that time.  I spent a total of 45 minutes in face-to-face and non-face-to-face activities related to this patient's care today.  Thank you for involving me in the care of this patient.   This note was partially dictated using voice recognition software, so please excuse any errors that were not corrected.   Nyleah Mcginnis K. Myer Haff MD, [facility] Neurosurgery

## 2022-03-16 NOTE — Assessment & Plan Note (Addendum)
BP as low as 87/61 on AM of 7/13, suspect due to pain medications.  Otherwise no signs of sepsis.

## 2022-03-16 NOTE — Progress Notes (Signed)
ID Pt is having some back pain No fever  O/e awake and alert No distress BP 112/68 (BP Location: Right Arm)   Pulse 86   Temp 98 F (36.7 C) (Oral)   Resp 16   Ht 6\' 4"  (1.93 m)   Wt 88.5 kg   SpO2 95%   BMI 23.74 kg/m   Chest b/l air entry Hss1s2 Abd soft CNS non focal  Labs    Latest Ref Rng & Units 03/16/2022    5:55 AM 03/15/2022    5:00 AM 03/14/2022    9:36 PM  CBC  WBC 4.0 - 10.5 K/uL 4.2  6.7  6.1   Hemoglobin 13.0 - 17.0 g/dL 9.4  05/15/2022  16.0   Hematocrit 39.0 - 52.0 % 30.6  34.0  37.0   Platelets 150 - 400 K/uL 277  363  386        Latest Ref Rng & Units 03/16/2022    5:55 AM 03/15/2022    5:00 AM 03/14/2022    9:36 PM  CMP  Glucose 70 - 99 mg/dL 05/15/2022   106   BUN 6 - 20 mg/dL 19   15   Creatinine 269 - 1.24 mg/dL 4.85  4.62  7.03   Sodium 135 - 145 mmol/L 138   139   Potassium 3.5 - 5.1 mmol/L 4.2   4.5   Chloride 98 - 111 mmol/L 106   105   CO2 22 - 32 mmol/L 27   30   Calcium 8.9 - 10.3 mg/dL 8.3   9.1   Total Protein 6.5 - 8.1 g/dL   7.8   Total Bilirubin 0.3 - 1.2 mg/dL   0.5   Alkaline Phos 38 - 126 U/L   82   AST 15 - 41 U/L   13   ALT 0 - 44 U/L   12     Micro- aspiration of thoracic eparaspinal abscess- MSSA  Impression/recommendation MSSA spine infection of T8-T9 with discitis , osteo, paraspinal abscess and epidural Will need surgery to stabilize the vertebra eand to reduce bioburden On Nafcillin Neurosurgery    Untreated MSSA bacteremia in May 2023- pt left AMA  Substance use Discussed the management with patient and care team

## 2022-03-17 DIAGNOSIS — M4644 Discitis, unspecified, thoracic region: Secondary | ICD-10-CM | POA: Diagnosis not present

## 2022-03-17 DIAGNOSIS — M4624 Osteomyelitis of vertebra, thoracic region: Secondary | ICD-10-CM | POA: Diagnosis not present

## 2022-03-17 DIAGNOSIS — G062 Extradural and subdural abscess, unspecified: Secondary | ICD-10-CM | POA: Diagnosis not present

## 2022-03-17 MED ORDER — POLYETHYLENE GLYCOL 3350 17 G PO PACK
17.0000 g | PACK | Freq: Every day | ORAL | Status: DC
Start: 2022-03-17 — End: 2022-04-26
  Administered 2022-03-17 – 2022-04-24 (×34): 17 g via ORAL
  Filled 2022-03-17 (×37): qty 1

## 2022-03-17 MED ORDER — SENNOSIDES-DOCUSATE SODIUM 8.6-50 MG PO TABS
1.0000 | ORAL_TABLET | Freq: Two times a day (BID) | ORAL | Status: DC
  Administered 2022-03-17 – 2022-04-24 (×71): 1 via ORAL
  Filled 2022-03-17 (×79): qty 1

## 2022-03-17 MED ORDER — BISACODYL 5 MG PO TBEC
5.0000 mg | DELAYED_RELEASE_TABLET | Freq: Every day | ORAL | Status: DC | PRN
Start: 2022-03-17 — End: 2022-04-26
  Administered 2022-03-17 – 2022-03-19 (×2): 5 mg via ORAL
  Filled 2022-03-17 (×3): qty 1

## 2022-03-17 NOTE — Progress Notes (Signed)
ID Pt says he has back pain  But comfortable in bed Moving lower extremities well No bowel/bladder issue  Awake and alert Patient Vitals for the past 24 hrs:  BP Temp Temp src Pulse Resp SpO2  03/17/22 1931 112/71 99.3 F (37.4 C) -- 86 20 96 %  03/17/22 1600 106/69 99.3 F (37.4 C) -- 83 17 95 %  03/17/22 0738 103/67 98.1 F (36.7 C) Oral 72 18 98 %  03/17/22 0452 96/65 98.1 F (36.7 C) -- 67 17 93 %  03/16/22 2148 112/68 98 F (36.7 C) Oral 86 16 95 %   Chest cta Hss1s2 Abd soft CNS non focal  Labs    Latest Ref Rng & Units 03/16/2022    5:55 AM 03/15/2022    5:00 AM 03/14/2022    9:36 PM  CBC  WBC 4.0 - 10.5 K/uL 4.2  6.7  6.1   Hemoglobin 13.0 - 17.0 g/dL 9.4  73.4  19.3   Hematocrit 39.0 - 52.0 % 30.6  34.0  37.0   Platelets 150 - 400 K/uL 277  363  386        Latest Ref Rng & Units 03/16/2022    5:55 AM 03/15/2022    5:00 AM 03/14/2022    9:36 PM  CMP  Glucose 70 - 99 mg/dL 790   240   BUN 6 - 20 mg/dL 19   15   Creatinine 9.73 - 1.24 mg/dL 5.32  9.92  4.26   Sodium 135 - 145 mmol/L 138   139   Potassium 3.5 - 5.1 mmol/L 4.2   4.5   Chloride 98 - 111 mmol/L 106   105   CO2 22 - 32 mmol/L 27   30   Calcium 8.9 - 10.3 mg/dL 8.3   9.1   Total Protein 6.5 - 8.1 g/dL   7.8   Total Bilirubin 0.3 - 1.2 mg/dL   0.5   Alkaline Phos 38 - 126 U/L   82   AST 15 - 41 U/L   13   ALT 0 - 44 U/L   12      Micro MSSA  from abscess culture   Impression/recommendation  MSSA spine infection with T8-T9 vertebral osteo, discitis, paraspinal abscess and epidural abscess with kyphosis and bone destruction On nafcillin Blood culture neg Awaiting surgery next week  Untreated MSSA bacteremia in May 2023 when he left AMA and was taken to prison the same day and remianed incarcerated for 2 months When he was released he came to ED for back pain Polysubstance use- cannot be sent home on PICC  Discussed the management with patient ID will follow peripherally this weekend-  call if needed

## 2022-03-17 NOTE — Progress Notes (Signed)
    Attending Progress Note  History: Derek Dunn is here for thoracic back pain and osteomyelitis discitis at T8-9 with associated epidural abscess.   He continues to complain of thoracic radiculopathy but denies any increased numbness or new weakness overnight   Physical Exam: Vitals:   03/17/22 0452 03/17/22 0738  BP: 96/65 103/67  Pulse: 67 72  Resp: 17 18  Temp: 98.1 F (36.7 C) 98.1 F (36.7 C)  SpO2: 93% 98%    AA Ox3 CNI  Strength:5/5 throughout BLE  Sensation intact throughout.   Data:  Recent Labs  Lab 03/14/22 2136 03/15/22 0500 03/16/22 0555  NA 139  --  138  K 4.5  --  4.2  CL 105  --  106  CO2 30  --  27  BUN 15  --  19  CREATININE 0.73   < > 0.98  GLUCOSE 124*  --  101*  CALCIUM 9.1  --  8.3*   < > = values in this interval not displayed.   Recent Labs  Lab 03/14/22 2136  AST 13*  ALT 12  ALKPHOS 82     Recent Labs  Lab 03/14/22 2136 03/15/22 0500 03/16/22 0555  WBC 6.1 6.7 4.2  HGB 11.3* 10.4* 9.4*  HCT 37.0* 34.0* 30.6*  PLT 386 363 277   No results for input(s): "APTT", "INR" in the last 168 hours.       Other tests/results:  MR T spine 03/15/2022 IMPRESSION: 1. Findings consistent with acute osteomyelitis discitis at T8-9 with associated epidural phlegmon/abscess as above. Resultant severe spinal stenosis with cord flattening at this level, but no definite cord signal changes at this time. 2. Extension to involve the right T8-9 facet with evidence of concomitant septic arthritis. 3. Inflammatory changes involving the paraspinous soft tissues adjacent to T8-9 interspace with multiloculated soft tissue collections as above.     Electronically Signed   By: Rise Mu M.D.   On: 03/15/2022 03:15  Assessment/Plan:  Derek Dunn is a 51 y.o with a history of MSSA and thoracic osteomyelitis and discitis with segmental kyphosis suggestive of instability. He has remained neurologically stable   - continue  with ABX regimen as suggested by ID  - pain control - DVT prophylaxis - No plans for neurosurgical intervention today - will continue to monitor neurologic status while patient receives antibiotics.  - please call with any questions, concerns, or changes in neurologic status  Manning Charity PA-C Department of Neurosurgery

## 2022-03-17 NOTE — Progress Notes (Signed)
Progress Note   Patient: Derek Dunn WNU:[id number] DOB: 17-May-1971 DOA: 03/15/2022     2 DOS: the patient was seen and examined on 03/17/2022   Brief hospital course: 51 year old male with past medical history of polysubstance abuse including IV drug use in the past, housing insecurity (incarcerated past 2 months, just released (as of 7/12), hospital admission in May 2023 with MSSA bacteremia secondary to spinal osteomyelitis and epidural abscess, recurrent right inguinal hernia, history of signing out AGAINST MEDICAL ADVICE.  He presented to the ED from [facility] on 03/15/2022 with complaints of worsening back pain and recurrent right groin pain.  No reported fevers.  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  Labs were mostly unremarkable including no leukocytosis and normal lactic acid.  Patient was started on broad-spectrum IV antibiotic coverage and admitted for further evaluation and management.  Interventional radiology was consulted for abscess aspiration for cultures, performed on 7/12.    Neurosurgery consulted for consideration of washout in the OR, given poor likelihood of full resolution with antibiotics alone.  Monitor neurologic status while getting IV antibiotics, and may intervene early next week.  Assessment and Plan: * Epidural abscess Acute osteomyelitis discitis and facet joint septic arthritis at T8-9 with epidural abscess. Severe spinal stenosis at T8-T9. Acute septic arthritis at the left L5-S1 facet. History of IV drug use. Prior history MSSA bacteremia. --Initially on IV Cefepime, Vancomycin -- IR consulted, performed CT-guided aspiration for culture on 7/12 --Infectious disease consulted, appreciate antibiotic recommendations.   --On nafcillin continuous IV infusion --Neurosurgery consulted, for consideration of surgical  washout/intervention --Pain control per orders --Monitor fever curve, CBC  Septic arthritis (HCC) Signs of septic arthritis at level of L5-S1 seen on MRI lumbar spine.  Management as outlined, see epidural abscess.  Discitis thoracic region See epidural abscess for plan  Back pain Secondary to spinal infection as outlined. -- Treat infection as outlined -- Pain control as needed per orders: Scheduled Tylenol, scheduled gabapentin, as needed oxycodone, IV morphine for breakthrough pain  Vertebral osteomyelitis (HCC) Management as outlined  Right inguinal hernia S/p reduction in the ED.   --Pain control as needed --Monitor closely for signs of bowel obstruction or incarceration --Surgical consulted if recurrent  Hypotension BP as low as 87/61 this AM (7/13), suspect due to pain medications.  Otherwise no signs of sepsis. --Maintain MAP > 65 with fluids if needed --Monitor closely        Subjective: Patient patient awake sitting edge of bed getting ready to get up to ambulate when seen on rounds.  He reports ongoing mid back pain with radiation down both legs that gets worse with walking.  He denies fevers chills or any other complaints at this time.  Says pain is a little bit better controlled but has times it gets much worse.  Physical Exam: Vitals:   03/16/22 1531 03/16/22 2148 03/17/22 0452 03/17/22 0738  BP: 99/65 112/68 96/65 103/67  Pulse: 70 86 67 72  Resp: 18 16 17 18   Temp: 97.9 F (36.6 C) 98 F (36.7 C) 98.1 F (36.7 C) 98.1 F (36.7 C)  TempSrc:  Oral  Oral  SpO2: 96% 95% 93% 98%  Weight:      Height:       General exam: awake, alert, seated edge of bed no acute distress  HEENT: moist mucus membranes, hearing grossly normal  Respiratory  system: on room air, normal respiratory effort, lungs clear bilaterally without wheezes rales or rhonchi. Cardiovascular system: RRR, no peripheral edema.   Central nervous system: A&O x3. no gross focal neurologic  deficits, normal speech Extremities: moves all, no edema, normal tone Psychiatry: normal mood, congruent affect, judgement and insight appear normal   Data Reviewed:  No new labs to review today.  Micro --abscess culture from 7/12 growing moderate Staph aureus pansensitive  Family Communication: None  Disposition: Status is: Inpatient Remains inpatient appropriate because: Severity of illness remaining on empiric broad-spectrum IV antibiotics pending cultures with neurosurgery closely monitoring and anticipate surgical intervention/washout   Planned Discharge Destination:  Home with home health versus SNF.  Anticipate need for long course IV antibiotics.     Time spent: 35 minutes  Author: Pennie Banter, DO 03/17/2022 2:58 PM  For on call review [email or web address].

## 2022-03-18 DIAGNOSIS — G062 Extradural and subdural abscess, unspecified: Secondary | ICD-10-CM | POA: Diagnosis not present

## 2022-03-18 DIAGNOSIS — G4700 Insomnia, unspecified: Secondary | ICD-10-CM | POA: Clinically undetermined

## 2022-03-18 LAB — CBC
HCT: 31 % — ABNORMAL LOW (ref 39.0–52.0)
Hemoglobin: 9.4 g/dL — ABNORMAL LOW (ref 13.0–17.0)
MCH: 24.4 pg — ABNORMAL LOW (ref 26.0–34.0)
MCHC: 30.3 g/dL (ref 30.0–36.0)
MCV: 80.3 fL (ref 80.0–100.0)
Platelets: 266 10*3/uL (ref 150–400)
RBC: 3.86 MIL/uL — ABNORMAL LOW (ref 4.22–5.81)
RDW: 15.2 % (ref 11.5–15.5)
WBC: 4.4 10*3/uL (ref 4.0–10.5)
nRBC: 0 % (ref 0.0–0.2)

## 2022-03-18 MED ORDER — TRAZODONE HCL 50 MG PO TABS
50.0000 mg | ORAL_TABLET | Freq: Every day | ORAL | Status: DC
  Administered 2022-03-18 – 2022-03-24 (×7): 50 mg via ORAL
  Filled 2022-03-18 (×7): qty 1

## 2022-03-18 MED ORDER — OXYCODONE HCL 5 MG PO TABS
5.0000 mg | ORAL_TABLET | ORAL | Status: DC | PRN
  Administered 2022-03-18 – 2022-03-19 (×4): 10 mg via ORAL
  Filled 2022-03-18 (×4): qty 2

## 2022-03-18 NOTE — Progress Notes (Signed)
Progress Note   Patient: Derek Dunn NAT:[id number] DOB: 05-Aug-1971 DOA: 03/15/2022     3 DOS: the patient was seen and examined on 03/18/2022   Brief hospital course: 51 year old male with past medical history of polysubstance abuse including IV drug use in the past, housing insecurity (incarcerated past 2 months, just released (as of 7/12), hospital admission in May 2023 with MSSA bacteremia secondary to spinal osteomyelitis and epidural abscess, recurrent right inguinal hernia, history of signing out AGAINST MEDICAL ADVICE.  He presented to the ED from [facility] on 03/15/2022 with complaints of worsening back pain and recurrent right groin pain.  No reported fevers.  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  Labs were mostly unremarkable including no leukocytosis and normal lactic acid.  Patient was started on broad-spectrum IV antibiotic coverage and admitted for further evaluation and management.  Interventional radiology was consulted for abscess aspiration for cultures, performed on 7/12.    Neurosurgery consulted for consideration of washout in the OR, given poor likelihood of full resolution with antibiotics alone.  Monitoring neurologic status while getting IV antibiotics, and may intervene early next week.  Assessment and Plan: * Epidural abscess Acute osteomyelitis discitis and facet joint septic arthritis at T8-9 with epidural abscess. Severe spinal stenosis at T8-T9. Acute septic arthritis at the left L5-S1 facet. History of IV drug use. Prior history MSSA bacteremia. --Initially on IV Cefepime, Vancomycin -- IR consulted, performed CT-guided aspiration for culture on 7/12 --Infectious disease consulted, appreciate antibiotic recommendations.   --On nafcillin continuous IV infusion --Neurosurgery consulted, for consideration of  surgical washout/intervention --Pain control per orders --Monitor fever curve, CBC  Septic arthritis (HCC) Signs of septic arthritis at level of L5-S1 seen on MRI lumbar spine.  Management as outlined, see epidural abscess.  Discitis thoracic region See epidural abscess for plan  Back pain Secondary to spinal infection as outlined. -- Treat infection as outlined -- Pain control as needed per orders: Scheduled Tylenol, scheduled gabapentin, as needed oxycodone, IV morphine for breakthrough pain  Vertebral osteomyelitis (HCC) Management as outlined  Right inguinal hernia S/p reduction in the ED.   --Pain control as needed --Monitor closely for signs of bowel obstruction or incarceration --Surgical consulted if recurrent  Insomnia Trazodone at bedtime ordered.  Hypotension BP as low as 87/61 this AM (7/13), suspect due to pain medications.  Otherwise no signs of sepsis. --Maintain MAP > 65 with fluids if needed --Monitor closely        Subjective: Patient awake sitting up in bed when seen on rounds.  Reports worsening of back pain radiating around his sides and down both legs.  Reports feeling some leg weakness when walking.  No fever/chills.  Asks for something to help him sleep.  Physical Exam: Vitals:   03/17/22 1600 03/17/22 1931 03/18/22 0455 03/18/22 0827  BP: 106/69 112/71 112/84 102/75  Pulse: 83 86 71 65  Resp: 17 20 17 20   Temp: 99.3 F (37.4 C) 99.3 F (37.4 C) 98.2 F (36.8 C) 97.9 F (36.6 C)  TempSrc:      SpO2: 95% 96% 97% 98%  Weight:      Height:       General exam: awake, alert, no acute distress  HEENT: moist mucus membranes, hearing grossly normal  Respiratory system: CTAB, on room air, normal respiratory effort, on room air Cardiovascular system: RRR, no peripheral edema.  Central nervous system: A&O x3. no gross focal neurologic deficits, normal speech Extremities: moves all, no edema, normal tone Psychiatry: normal mood, congruent  affect, judgement and insight appear normal   Data Reviewed:  Notable labs: Hbg stable 9.4, no leukocytosis  Micro --abscess culture from 7/12 growing moderate Staph aureus pansensitive  Family Communication: None  Disposition: Status is: Inpatient Remains inpatient appropriate because: Severity of illness remaining on empiric broad-spectrum IV antibiotics pending cultures with neurosurgery closely monitoring and anticipate surgical intervention/washout   Planned Discharge Destination:  Home with home health versus SNF.  Anticipate need for long course IV antibiotics, cannot d/c with PICC due to hx of IVDU.     Time spent: 35 minutes  Author: Pennie Banter, DO 03/18/2022 12:11 PM  For on call review [email or web address].

## 2022-03-18 NOTE — Assessment & Plan Note (Signed)
Trazodone at bedtime ordered.

## 2022-03-19 DIAGNOSIS — G062 Extradural and subdural abscess, unspecified: Secondary | ICD-10-CM | POA: Diagnosis not present

## 2022-03-19 MED ORDER — MORPHINE SULFATE (PF) 4 MG/ML IV SOLN
4.0000 mg | INTRAVENOUS | Status: DC | PRN
  Administered 2022-03-19 – 2022-03-21 (×7): 4 mg via INTRAVENOUS
  Filled 2022-03-19 (×8): qty 1

## 2022-03-19 NOTE — Plan of Care (Signed)

## 2022-03-19 NOTE — Progress Notes (Signed)
Progress Note   Patient: Derek Dunn GYK:[id number] DOB: 03-07-1971 DOA: 03/15/2022     4 DOS: the patient was seen and examined on 03/19/2022   Brief hospital course: 51 year old male with past medical history of polysubstance abuse including IV drug use in the past, housing insecurity (incarcerated past 2 months, just released (as of 7/12), hospital admission in May 2023 with MSSA bacteremia secondary to spinal osteomyelitis and epidural abscess, recurrent right inguinal hernia, history of signing out AGAINST MEDICAL ADVICE.  He presented to the ED from [facility] on 03/15/2022 with complaints of worsening back pain and recurrent right groin pain.  No reported fevers.  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  Labs were mostly unremarkable including no leukocytosis and normal lactic acid.  Patient was started on broad-spectrum IV antibiotic coverage and admitted for further evaluation and management.  Interventional radiology was consulted for abscess aspiration for cultures, performed on 7/12.    Neurosurgery consulted for consideration of washout in the OR, given poor likelihood of full resolution with antibiotics alone.  Monitoring neurologic status while getting IV antibiotics, and may intervene early next week.  Assessment and Plan: * Epidural abscess Acute osteomyelitis discitis and facet joint septic arthritis at T8-9 with epidural abscess. Severe spinal stenosis at T8-T9. Acute septic arthritis at the left L5-S1 facet. History of IV drug use. Prior history MSSA bacteremia. --Initially on IV Cefepime, Vancomycin -- IR consulted, performed CT-guided aspiration for culture on 7/12 --Infectious disease consulted, appreciate antibiotic recommendations.   --On nafcillin continuous IV infusion --Neurosurgery consulted, for consideration of  surgical washout/intervention --Pain control per orders -- scheduled Tylenol, changed as needed PO oxy to PO dilaudid due to uncontrolled pain.  IV morphine for breakthrough. --Monitor fever curve, CBC  Septic arthritis (HCC) Signs of septic arthritis at level of L5-S1 seen on MRI lumbar spine.  Management as outlined, see epidural abscess.  Discitis thoracic region See epidural abscess for plan  Back pain Secondary to spinal infection as outlined. -- Treat infection as outlined -- Pain control as needed per orders: Scheduled Tylenol, scheduled gabapentin, as needed oxycodone, IV morphine for breakthrough pain  Vertebral osteomyelitis (HCC) Management as outlined  Right inguinal hernia S/p reduction in the ED.   --Pain control as needed --Monitor closely for signs of bowel obstruction or incarceration --Surgical consulted if recurrent  Insomnia Trazodone at bedtime ordered.  Hypotension BP as low as 87/61 this AM (7/13), suspect due to pain medications.  Otherwise no signs of sepsis. --Maintain MAP > 65 with fluids if needed --Monitor closely        Subjective: Patient awake resting in bed when seen on rounds today.  He reports pain remains uncontrolled and severe most of the time.  Reports the best relief he got was when he was given Dilaudid in the ED.  Agreeable to trying oral Dilaudid in place of oxycodone.  No other acute complaints including any fevers or chills.  No acute events reported.  Physical Exam: Vitals:   03/18/22 1659 03/18/22 2009 03/19/22 0637 03/19/22 0840  BP: (!) 139/95 122/80 134/82 (!) 131/96  Pulse: 76 73 61 86  Resp:  18 16 18   Temp: 98.2 F (36.8 C) 98.4 F (36.9 C) 97.9 F (36.6 C) 98.3 F (36.8 C)  TempSrc:  Oral Oral   SpO2: 99% 96% 100% 99%  Weight:  Height:       General exam: awake, alert, no acute distress  HEENT: moist mucus membranes, hearing grossly normal  Respiratory system: On room air, normal respiratory  effort Cardiovascular system: Regular in rhythm, no peripheral edema.   Central nervous system: A&O x3. no gross focal neurologic deficits, normal speech Extremities: Moves all, normal tone Psychiatry: Normal mood and affect   Data Reviewed:  Notable labs: Hbg stable  Micro --abscess culture from 7/12 growing moderate Staph aureus pansensitive  Family Communication: None  Disposition: Status is: Inpatient Remains inpatient appropriate because: Severity of illness remaining on empiric broad-spectrum IV antibiotics pending cultures with neurosurgery closely monitoring and anticipate surgical intervention/washout   Planned Discharge Destination:  Home with home health versus SNF.  Anticipate need for long course IV antibiotics, cannot d/c with PICC due to hx of IVDU.     Time spent: 35 minutes  Author: Pennie Banter, DO 03/19/2022 12:21 PM  For on call review [email or web address].

## 2022-03-20 DIAGNOSIS — M4644 Discitis, unspecified, thoracic region: Secondary | ICD-10-CM | POA: Diagnosis not present

## 2022-03-20 DIAGNOSIS — G062 Extradural and subdural abscess, unspecified: Secondary | ICD-10-CM | POA: Diagnosis not present

## 2022-03-20 DIAGNOSIS — M4624 Osteomyelitis of vertebra, thoracic region: Secondary | ICD-10-CM | POA: Diagnosis not present

## 2022-03-20 LAB — CULTURE, BLOOD (ROUTINE X 2)
Culture: NO GROWTH
Culture: NO GROWTH
Special Requests: ADEQUATE
Special Requests: ADEQUATE

## 2022-03-20 LAB — AEROBIC/ANAEROBIC CULTURE W GRAM STAIN (SURGICAL/DEEP WOUND)

## 2022-03-20 MED ORDER — HYDROMORPHONE HCL 1 MG/ML IJ SOLN
1.0000 mg | INTRAMUSCULAR | Status: DC | PRN
  Administered 2022-03-20 – 2022-03-21 (×8): 1 mg via INTRAVENOUS
  Filled 2022-03-20 (×8): qty 1

## 2022-03-20 MED ORDER — GABAPENTIN 300 MG PO CAPS
900.0000 mg | ORAL_CAPSULE | Freq: Three times a day (TID) | ORAL | Status: DC
  Administered 2022-03-20 – 2022-04-19 (×90): 900 mg via ORAL
  Filled 2022-03-20 (×91): qty 3

## 2022-03-20 MED ORDER — CHLORHEXIDINE GLUCONATE CLOTH 2 % EX PADS
6.0000 | MEDICATED_PAD | Freq: Every day | CUTANEOUS | Status: DC
Start: 2022-03-20 — End: 2022-04-11
  Administered 2022-03-20 – 2022-04-11 (×22): 6 via TOPICAL

## 2022-03-20 MED ORDER — MUPIROCIN 2 % EX OINT
TOPICAL_OINTMENT | Freq: Two times a day (BID) | CUTANEOUS | Status: DC
  Administered 2022-03-28 – 2022-04-07 (×4): 1 via NASAL
  Filled 2022-03-20: qty 22

## 2022-03-20 NOTE — Progress Notes (Signed)
Progress Note   Patient: Derek Dunn LKG:[id number] DOB: 12/15/1970 DOA: 03/15/2022     5 DOS: the patient was seen and examined on 03/20/2022   Brief hospital course: 51 year old male with past medical history of polysubstance abuse including IV drug use in the past, housing insecurity (incarcerated past 2 months, just released (as of 7/12), hospital admission in May 2023 with MSSA bacteremia secondary to spinal osteomyelitis and epidural abscess, recurrent right inguinal hernia, history of signing out AGAINST MEDICAL ADVICE.  He presented to the ED from [facility] on 03/15/2022 with complaints of worsening back pain and recurrent right groin pain.  No reported fevers.  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  Labs were mostly unremarkable including no leukocytosis and normal lactic acid.  Patient was started on broad-spectrum IV antibiotic coverage and admitted for further evaluation and management.  Interventional radiology was consulted for abscess aspiration for cultures, performed on 7/12.    Neurosurgery consulted for consideration of washout in the OR, given poor likelihood of full resolution with antibiotics alone.  Monitoring neurologic status while getting IV antibiotics, and may intervene early next week.  Assessment and Plan: * Epidural abscess Acute osteomyelitis discitis and facet joint septic arthritis at T8-9 with epidural abscess. Severe spinal stenosis at T8-T9. Acute septic arthritis at the left L5-S1 facet. History of IV drug use. Prior history MSSA bacteremia. --Initially on IV Cefepime, Vancomycin -- IR consulted, performed CT-guided aspiration for culture on 7/12 --Infectious disease consulted, appreciate antibiotic recommendations.   --On nafcillin continuous IV infusion --Neurosurgery consulted, for consideration of  surgical washout/intervention --Pain control per orders -- scheduled Tylenol, changed as needed PO oxy to PO dilaudid due to uncontrolled pain.  IV morphine for breakthrough. --Monitor fever curve, CBC  Septic arthritis (HCC) Signs of septic arthritis at level of L5-S1 seen on MRI lumbar spine.  Management as outlined, see epidural abscess.  Discitis thoracic region See epidural abscess for plan  Back pain Secondary to spinal infection as outlined. -- Treat infection as outlined -- Pain control as needed per orders: Scheduled Tylenol Scheduled gabapentin As needed PO dilaudid IV morphine for breakthrough pain  Vertebral osteomyelitis (HCC) Management as outlined  Right inguinal hernia S/p reduction in the ED.   --Pain control as needed --Monitor closely for signs of bowel obstruction or incarceration --Surgical consulted if recurrent  Insomnia Trazodone at bedtime ordered.  Hypotension BP as low as 87/61 this AM (7/13), suspect due to pain medications.  Otherwise no signs of sepsis. --Maintain MAP > 65 with fluids if needed --Monitor closely        Subjective: Patient awake resting in bed when seen on rounds today.  Continues to report uncontrolled back pain, severe.  Was not able to sleep last night due to pain.  Asks about when surgery will be.  Asks for pain meds to be increased for better pain relief.   Physical Exam: Vitals:   03/19/22 0840 03/19/22 1549 03/19/22 2030 03/20/22 0424  BP: (!) 131/96 116/74 124/86 118/70  Pulse: 86 77 75 67  Resp: 18 18 16 14   Temp: 98.3 F (36.8 C) 99.4 F (37.4 C) 97.8 F (36.6 C) 97.9 F (36.6 C)  TempSrc:   Oral Oral  SpO2: 99% 97% 97% 97%  Weight:      Height:       General exam: awake resting in bed, alert,  no acute distress  HEENT: moist mucus membranes, hearing grossly normal  Respiratory system: lungs clear, On room air, normal respiratory effort Cardiovascular system: Regular in rhythm, no peripheral edema.    Central nervous system: A&O x3. no gross focal neurologic deficits, normal speech Extremities: Moves all, normal tone Psychiatry: Normal mood and affect   Data Reviewed: No new labs today.    Family Communication: None  Disposition: Status is: Inpatient Remains inpatient appropriate because: Severity of illness remaining on empiric broad-spectrum IV antibiotics pending cultures with neurosurgery closely monitoring and anticipate surgical intervention/washout   Planned Discharge Destination:  Home with home health versus SNF.  Anticipate need for long course IV antibiotics, cannot d/c with PICC due to hx of IVDU.     Time spent: 35 minutes  Author: Pennie Banter, DO 03/20/2022 4:16 PM  For on call review [email or web address].

## 2022-03-20 NOTE — Progress Notes (Signed)
ID Continues to have back pain Says not responding to antibiotics or pain meds  Awake and alert Patient Vitals for the past 24 hrs:  BP Temp Temp src Pulse Resp SpO2  03/20/22 0424 118/70 97.9 F (36.6 C) Oral 67 14 97 %  03/19/22 2030 124/86 97.8 F (36.6 C) Oral 75 16 97 %  03/19/22 1549 116/74 99.4 F (37.4 C) -- 77 18 97 %   Chest cta Hss1s2 Abd soft CNS non focal  Labs    Latest Ref Rng & Units 03/18/2022    6:14 AM 03/16/2022    5:55 AM 03/15/2022    5:00 AM  CBC  WBC 4.0 - 10.5 K/uL 4.4  4.2  6.7   Hemoglobin 13.0 - 17.0 g/dL 9.4  9.4  10.4   Hematocrit 39.0 - 52.0 % 31.0  30.6  34.0   Platelets 150 - 400 K/uL 266  277  363        Latest Ref Rng & Units 03/16/2022    5:55 AM 03/15/2022    5:00 AM 03/14/2022    9:36 PM  CMP  Glucose 70 - 99 mg/dL 101   124   BUN 6 - 20 mg/dL 19   15   Creatinine 0.61 - 1.24 mg/dL 0.98  0.79  0.73   Sodium 135 - 145 mmol/L 138   139   Potassium 3.5 - 5.1 mmol/L 4.2   4.5   Chloride 98 - 111 mmol/L 106   105   CO2 22 - 32 mmol/L 27   30   Calcium 8.9 - 10.3 mg/dL 8.3   9.1   Total Protein 6.5 - 8.1 g/dL   7.8   Total Bilirubin 0.3 - 1.2 mg/dL   0.5   Alkaline Phos 38 - 126 U/L   82   AST 15 - 41 U/L   13   ALT 0 - 44 U/L   12      Micro MSSA  from abscess culture   Impression/recommendation  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

## 2022-03-21 ENCOUNTER — Inpatient Hospital Stay

## 2022-03-21 DIAGNOSIS — G062 Extradural and subdural abscess, unspecified: Secondary | ICD-10-CM | POA: Diagnosis not present

## 2022-03-21 LAB — COMPREHENSIVE METABOLIC PANEL
ALT: 59 U/L — ABNORMAL HIGH (ref 0–44)
AST: 60 U/L — ABNORMAL HIGH (ref 15–41)
Albumin: 2.5 g/dL — ABNORMAL LOW (ref 3.5–5.0)
Alkaline Phosphatase: 74 U/L (ref 38–126)
Anion gap: 6 (ref 5–15)
BUN: 20 mg/dL (ref 6–20)
CO2: 26 mmol/L (ref 22–32)
Calcium: 8.9 mg/dL (ref 8.9–10.3)
Chloride: 107 mmol/L (ref 98–111)
Creatinine, Ser: 0.86 mg/dL (ref 0.61–1.24)
GFR, Estimated: >60 mL/min (ref >60)
Glucose, Bld: 120 mg/dL — ABNORMAL HIGH (ref 70–99)
Potassium: 4.9 mmol/L (ref 3.5–5.1)
Sodium: 139 mmol/L (ref 135–145)
Total Bilirubin: 0.7 mg/dL (ref 0.3–1.2)
Total Protein: 6.5 g/dL (ref 6.5–8.1)

## 2022-03-21 LAB — CBC WITH DIFFERENTIAL/PLATELET
Abs Immature Granulocytes: 0.04 10*3/uL (ref 0.00–0.07)
Basophils Absolute: 0 10*3/uL (ref 0.0–0.1)
Basophils Relative: 1 %
Eosinophils Absolute: 0.1 10*3/uL (ref 0.0–0.5)
Eosinophils Relative: 3 %
HCT: 34.3 % — ABNORMAL LOW (ref 39.0–52.0)
Hemoglobin: 10.2 g/dL — ABNORMAL LOW (ref 13.0–17.0)
Immature Granulocytes: 1 %
Lymphocytes Relative: 23 %
Lymphs Abs: 1 10*3/uL (ref 0.7–4.0)
MCH: 24.5 pg — ABNORMAL LOW (ref 26.0–34.0)
MCHC: 29.7 g/dL — ABNORMAL LOW (ref 30.0–36.0)
MCV: 82.5 fL (ref 80.0–100.0)
Monocytes Absolute: 0.3 10*3/uL (ref 0.1–1.0)
Monocytes Relative: 8 %
Neutro Abs: 2.7 10*3/uL (ref 1.7–7.7)
Neutrophils Relative %: 64 %
Platelets: 263 10*3/uL (ref 150–400)
RBC: 4.16 MIL/uL — ABNORMAL LOW (ref 4.22–5.81)
RDW: 16.1 % — ABNORMAL HIGH (ref 11.5–15.5)
WBC: 4.2 10*3/uL (ref 4.0–10.5)
nRBC: 0 % (ref 0.0–0.2)

## 2022-03-21 LAB — C-REACTIVE PROTEIN: CRP: 1.3 mg/dL — ABNORMAL HIGH (ref <1.0)

## 2022-03-21 LAB — SEDIMENTATION RATE: Sed Rate: 54 mm/hr — ABNORMAL HIGH (ref 0–20)

## 2022-03-21 MED ORDER — HYDROMORPHONE HCL 1 MG/ML IJ SOLN
1.0000 mg | INTRAMUSCULAR | Status: DC | PRN
  Administered 2022-03-21 – 2022-03-24 (×17): 1 mg via INTRAVENOUS
  Filled 2022-03-21 (×17): qty 1

## 2022-03-21 MED ORDER — ORAL CARE MOUTH RINSE: 15.0000 mL | OROMUCOSAL | Status: DC | PRN

## 2022-03-21 MED ORDER — OXYCODONE HCL 5 MG PO TABS
10.0000 mg | ORAL_TABLET | ORAL | Status: DC | PRN
  Administered 2022-03-21 – 2022-03-24 (×16): 10 mg via ORAL
  Administered 2022-03-24 – 2022-03-28 (×14): 15 mg via ORAL
  Administered 2022-03-28 – 2022-03-29 (×4): 10 mg via ORAL
  Administered 2022-03-30 – 2022-04-03 (×23): 15 mg via ORAL
  Administered 2022-04-04 (×2): 10 mg via ORAL
  Administered 2022-04-04 (×2): 15 mg via ORAL
  Administered 2022-04-04: 10 mg via ORAL
  Administered 2022-04-05 – 2022-04-10 (×27): 15 mg via ORAL
  Filled 2022-03-21: qty 2
  Filled 2022-03-21 (×3): qty 3
  Filled 2022-03-21: qty 2
  Filled 2022-03-21: qty 3
  Filled 2022-03-21: qty 2
  Filled 2022-03-21 (×14): qty 3
  Filled 2022-03-21: qty 2
  Filled 2022-03-21: qty 3
  Filled 2022-03-21 (×2): qty 2
  Filled 2022-03-21 (×4): qty 3
  Filled 2022-03-21: qty 2
  Filled 2022-03-21 (×8): qty 3
  Filled 2022-03-21 (×3): qty 2
  Filled 2022-03-21: qty 3
  Filled 2022-03-21: qty 2
  Filled 2022-03-21 (×2): qty 3
  Filled 2022-03-21 (×3): qty 2
  Filled 2022-03-21: qty 3
  Filled 2022-03-21: qty 2
  Filled 2022-03-21 (×5): qty 3
  Filled 2022-03-21 (×2): qty 2
  Filled 2022-03-21 (×4): qty 3
  Filled 2022-03-21: qty 2
  Filled 2022-03-21: qty 3
  Filled 2022-03-21: qty 2
  Filled 2022-03-21 (×2): qty 3
  Filled 2022-03-21 (×2): qty 2
  Filled 2022-03-21 (×2): qty 3
  Filled 2022-03-21: qty 2
  Filled 2022-03-21 (×5): qty 3
  Filled 2022-03-21: qty 2
  Filled 2022-03-21 (×8): qty 3
  Filled 2022-03-21: qty 2
  Filled 2022-03-21 (×4): qty 3

## 2022-03-21 NOTE — Progress Notes (Signed)
Progress Note   Patient: Derek Dunn [id number]:[id number] DOB: August 05, 1971 DOA: 03/15/2022     6 DOS: the patient was seen and examined on 03/21/2022   Brief hospital course: 51 year old male with past medical history of polysubstance abuse including IV drug use in the past, housing insecurity (incarcerated past 2 months, just released (as of 7/12), hospital admission in May 2023 with MSSA bacteremia secondary to spinal osteomyelitis and epidural abscess, recurrent right inguinal hernia, history of signing out AGAINST MEDICAL ADVICE.  He presented to the ED from [facility] on 03/15/2022 with complaints of worsening back pain and recurrent right groin pain.  No reported fevers.  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  Labs were mostly unremarkable including no leukocytosis and normal lactic acid.  Patient was started on broad-spectrum IV antibiotic coverage and admitted for further evaluation and management.  Interventional radiology was consulted for abscess aspiration for cultures, performed on 7/12.    Neurosurgery consulted for consideration of washout in the OR, given poor likelihood of full resolution with antibiotics alone.  Monitoring neurologic status while getting IV antibiotics, and they may take patient to OR on Friday  Assessment and Plan: * Epidural abscess Acute osteomyelitis discitis and facet joint septic arthritis at T8-9 with epidural abscess. Severe spinal stenosis at T8-T9. Acute septic arthritis at the left L5-S1 facet. History of IV drug use. Prior history MSSA bacteremia. --Initially on IV Cefepime, Vancomycin -- IR consulted, performed CT-guided aspiration for culture on 7/12 --Infectious disease consulted, appreciate antibiotic recommendations.   --On nafcillin continuous IV infusion --Neurosurgery consulted, for consideration  of surgical washout/intervention --Repeat T-spine film today ?increased kyphosis and lower lung volumes --N/S may be taking to OR Friday --Pain control per orders  --Monitor fever curve, CBC  Septic arthritis (HCC) Signs of septic arthritis at level of L5-S1 seen on MRI lumbar spine.  Management as outlined, see epidural abscess.  Discitis thoracic region See epidural abscess for plan  Back pain Secondary to spinal infection as outlined. -- Treat infection as outlined -- Pain control as needed per orders:  Scheduled Tylenol Scheduled gabapentin As needed PO oxycodone IV dilaudid for breakthrough only (morphine was not effective, changed in cross coverage last night)  Vertebral osteomyelitis (HCC) Management as outlined  Right inguinal hernia S/p reduction in the ED.   --Pain control as needed --Monitor closely for signs of bowel obstruction or incarceration --Surgical consulted if recurrent  Insomnia Trazodone at bedtime ordered.  Hypotension BP as low as 87/61 on AM of 7/13, suspect due to pain medications.  Otherwise no signs of sepsis. --Maintain MAP > 65 with fluids if needed --Monitor closely        Subjective: Patient awake resting in bed when seen on rounds today.  Continues to report uncontrolled pain although appears fairly comfortable in the bed.  IV pain medicine was changed overnight to dilaudid but he says it only lasts 2-3 hours.  All oral pain meds were discontinued.  I added them back and advised patient that IV is for pain uncontrolled by oral medications.  He expresses agreement and understanding.  No BM despite stool softeners.  Reports getting a productive cough but not having fever or chills.   Physical Exam: Vitals:   03/20/22 1701 03/21/22 0059 03/21/22 0543 03/21/22 0816  BP: 106/62 97/61 99/64  120/77  Pulse: 68 69 62 60  Resp: 16 18 17  18  Temp: 97.9 F (36.6 C) 97.7 F (36.5 C) 97.7 F (36.5 C) 98 F (36.7 C)  TempSrc:  Oral Oral    SpO2: 97% 98% 97% 99%  Weight:      Height:       General exam: awake resting in bed, alert, no acute distress  HEENT: moist mucus membranes, hearing grossly normal  Respiratory system: lungs clear, On room air, normal respiratory effort Cardiovascular system: Regular in rhythm, no peripheral edema.   Central nervous system: A&O x3. no gross focal neurologic deficits, normal speech Extremities: Moves all, normal tone Psychiatry: Normal mood and affect   Data Reviewed: No new labs today.    Family Communication: None  Disposition: Status is: Inpatient Remains inpatient appropriate because: Severity of illness remaining on empiric broad-spectrum IV antibiotics pending cultures with neurosurgery closely monitoring and anticipate surgical intervention/washout   Planned Discharge Destination:  Home with home health versus SNF.  Anticipate need for long course IV antibiotics, cannot d/c with PICC due to hx of IVDU.     Time spent: 35 minutes  Author: Pennie Banter, DO 03/21/2022 1:49 PM  For on call review [email or web address].

## 2022-03-22 DIAGNOSIS — G062 Extradural and subdural abscess, unspecified: Secondary | ICD-10-CM | POA: Diagnosis not present

## 2022-03-22 DIAGNOSIS — M0008 Staphylococcal arthritis, vertebrae: Secondary | ICD-10-CM | POA: Diagnosis not present

## 2022-03-22 DIAGNOSIS — M532X4 Spinal instabilities, thoracic region: Secondary | ICD-10-CM | POA: Diagnosis not present

## 2022-03-22 DIAGNOSIS — M4644 Discitis, unspecified, thoracic region: Secondary | ICD-10-CM | POA: Diagnosis not present

## 2022-03-22 DIAGNOSIS — M4620 Osteomyelitis of vertebra, site unspecified: Secondary | ICD-10-CM

## 2022-03-22 LAB — HEPATIC FUNCTION PANEL
ALT: 58 U/L — ABNORMAL HIGH (ref 0–44)
AST: 53 U/L — ABNORMAL HIGH (ref 15–41)
Albumin: 2.6 g/dL — ABNORMAL LOW (ref 3.5–5.0)
Alkaline Phosphatase: 77 U/L (ref 38–126)
Bilirubin, Direct: <0.1 mg/dL (ref 0.0–0.2)
Total Bilirubin: 0.6 mg/dL (ref 0.3–1.2)
Total Protein: 6.6 g/dL (ref 6.5–8.1)

## 2022-03-22 LAB — CREATININE, SERUM
Creatinine, Ser: 0.88 mg/dL (ref 0.61–1.24)
GFR, Estimated: >60 mL/min (ref >60)

## 2022-03-22 MED ORDER — SORBITOL 70 % SOLN
30.0000 mL | Freq: Every day | Status: DC
  Administered 2022-03-22 – 2022-04-13 (×21): 30 mL via ORAL
  Filled 2022-03-22 (×36): qty 30

## 2022-03-22 MED ORDER — LACTULOSE 10 GM/15ML PO SOLN
20.0000 g | Freq: Two times a day (BID) | ORAL | Status: DC | PRN
Start: 2022-03-22 — End: 2022-04-26
  Administered 2022-04-05 – 2022-04-19 (×5): 20 g via ORAL
  Filled 2022-03-22 (×5): qty 30

## 2022-03-22 NOTE — Progress Notes (Addendum)
    Attending Progress Note  History: Derek Dunn is here for thoracic discitis and stenosis.  03/22/2022 Derek Dunn continues to have severe thoracic pain.  He has continued issues with weight bearing.  03/16/2022 Derek Dunn is here with a chief complaint of thoracic back pain. He was previously admitted with osteomyelitis in May but left AMA. He has MSSA at the time.  He presented this time with continued and worsening pain.  He denies fevers.   He denies any neurologic symptoms other than pain.    Physical Exam: Vitals:   03/22/22 0857 03/22/22 1600  BP: 101/67 98/63  Pulse: 79 81  Resp: 20 18  Temp: 98.2 F (36.8 C) 98 F (36.7 C)  SpO2: 98% 96%    AA Ox3 CNI  Strength:5/5 throughout Ble SILT  Data:  Recent Labs  Lab 03/16/22 0555 03/21/22 0502 03/22/22 0453  NA 138 139  --   K 4.2 4.9  --   CL 106 107  --   CO2 27 26  --   BUN 19 20  --   CREATININE 0.98 0.86 0.88  GLUCOSE 101* 120*  --   CALCIUM 8.3* 8.9  --    Recent Labs  Lab 03/22/22 0453  AST 53*  ALT 58*  ALKPHOS 77     Recent Labs  Lab 03/16/22 0555 03/18/22 0614 03/21/22 0502  WBC 4.2 4.4 4.2  HGB 9.4* 9.4* 10.2*  HCT 30.6* 31.0* 34.3*  PLT 277 266 263   No results for input(s): "APTT", "INR" in the last 168 hours.       Other tests/results:  T spine xrays 03/21/22 - destruction of T8-9 disc space has caused enhanced thoracic kyphosis at t8/9  Assessment/Plan:  Derek Dunn is here for thoracic discitis with destruction at T8-9 and instability related to his discitis.  There is epidural abscess at T8-9 causing severe stenosis  - mobilize - pain control - DVT prophylaxis - PTOT  Dr. Rivka Safer and I have discussed this case at length.  To achieve adequate source control and optimize clearance of his infection, she has advised in favor of washout.  To achieve this, he will also need fixation from T7-11 due to the destruction and instability at T8-9.  I  discussed this with the patient, who asked that we proceed.  I discussed the planned procedure at length with the patient, including the risks, benefits, alternatives, and indications. The risks discussed include but are not limited to bleeding, infection, need for reoperation, spinal fluid leak, stroke, vision loss, anesthetic complication, coma, paralysis, and even death. I also described in detail that improvement was not guaranteed.  The patient expressed understanding of these risks, and asked that we proceed with surgery. I described the surgery in layman's terms, and gave ample opportunity for questions, which were answered to the best of my ability.     Venetia Night MD, [facility] Department of Neurosurgery

## 2022-03-22 NOTE — Progress Notes (Signed)
ID Continues to have back pain No fever  No numbness or weakness legs  Awake and alert Patient Vitals for the past 24 hrs:  BP Temp Pulse Resp SpO2  03/22/22 0857 101/67 98.2 F (36.8 C) 79 20 98 %  03/22/22 0405 103/67 97.9 F (36.6 C) 76 18 97 %  03/21/22 2010 110/68 98.4 F (36.9 C) 86 20 96 %  03/21/22 1546 108/69 98.4 F (36.9 C) 86 18 96 %   Chest cta Hss1s2 Abd soft CNS non focal  Labs    Latest Ref Rng & Units 03/21/2022    5:02 AM 03/18/2022    6:14 AM 03/16/2022    5:55 AM  CBC  WBC 4.0 - 10.5 K/uL 4.2  4.4  4.2   Hemoglobin 13.0 - 17.0 g/dL 10.2  9.4  9.4   Hematocrit 39.0 - 52.0 % 34.3  31.0  30.6   Platelets 150 - 400 K/uL 263  266  277        Latest Ref Rng & Units 03/22/2022    4:53 AM 03/21/2022    5:02 AM 03/16/2022    5:55 AM  CMP  Glucose 70 - 99 mg/dL  120  101   BUN 6 - 20 mg/dL  20  19   Creatinine 0.61 - 1.24 mg/dL 0.88  0.86  0.98   Sodium 135 - 145 mmol/L  139  138   Potassium 3.5 - 5.1 mmol/L  4.9  4.2   Chloride 98 - 111 mmol/L  107  106   CO2 22 - 32 mmol/L  26  27   Calcium 8.9 - 10.3 mg/dL  8.9  8.3   Total Protein 6.5 - 8.1 g/dL  6.5    Total Bilirubin 0.3 - 1.2 mg/dL  0.7    Alkaline Phos 38 - 126 U/L  74    AST 15 - 41 U/L  60    ALT 0 - 44 U/L  59       Micro MSSA  from abscess culture  ESR 54 CRP 1.3 Impression/recommendation  Staph aureus spine infection with T8-T9 vertebral osteo, discitis, paraspinal abscess and epidural abscess with kyphosis and bone destruction On nafcillin Blood culture neg Discussed with neurosurgeon-   Mildly elevated transaminases- watch closely while on nafcillin  Untreated MSSA bacteremia in May 2023 when he left AMA and was taken to prison the same day and remained incarcerated for 2 months When he was released he came to ED for back pain Polysubstance use- cannot be sent home on PICC  Discussed the management with patient

## 2022-03-22 NOTE — Progress Notes (Signed)
Progress Note    Derek Dunn   VZD:[id number]  DOB: Apr 15, 1971  DOA: 03/15/2022     7 PCP: Pcp, No  Initial CC: back pain  Hospital Course: Mr. Tippen is a 51 year old male with PMH polysubstance abuse including IV drug use in the past, housing insecurity (incarcerated past 2 months, just released (as of 7/12), hospital admission in May 2023 with MSSA bacteremia secondary to spinal osteomyelitis and epidural abscess, recurrent right inguinal hernia, history of signing out AMA.  He presented to the ED from [facility] on 03/15/2022 with complaints of worsening back pain and recurrent right groin pain.  No reported fevers.  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  Labs were mostly unremarkable including no leukocytosis and normal lactic acid.  Patient was started on broad-spectrum IV antibiotic coverage and admitted for further evaluation and management.  Interventional radiology was consulted for abscess aspiration for cultures, performed on 7/12. Infectious disease was also consulted.   Neurosurgery consulted for consideration of washout in the OR, given poor likelihood of full resolution with antibiotics alone.  Monitoring neurologic status while getting IV antibiotics, and they may take patient to OR on Friday.  Interval History:  No events overnight. Seen this morning resting bed. Has not been out of bed much due to pain; informed him he needs to be more mobile. PT consulted to assist today.   Assessment and Plan: * Epidural abscess Acute osteomyelitis discitis and facet joint septic arthritis at T8-9 with epidural abscess. Severe spinal stenosis at T8-T9. Acute septic arthritis at the left L5-S1 facet. History of IV drug use. Prior history MSSA bacteremia. --Initially on IV Cefepime, Vancomycin -- IR consulted, performed CT-guided  aspiration for culture on 7/12 --Infectious disease consulted, appreciate antibiotic recommendations.   --On nafcillin continuous IV infusion --Neurosurgery consulted, for consideration of surgical washout/intervention --Repeat T-spine film; ?increased kyphosis and lower lung volumes --N/S may be taking to OR Friday --Pain control per orders  --Monitor fever curve, CBC  Septic arthritis (HCC) Signs of septic arthritis at level of L5-S1 seen on MRI lumbar spine.  Management as outlined, see epidural abscess.  Discitis thoracic region See epidural abscess for plan  Back pain Secondary to spinal infection as outlined. -- Treat infection as outlined -- Pain control as needed per orders:  Scheduled Tylenol Scheduled gabapentin As needed PO oxycodone IV dilaudid for breakthrough only (morphine was not effective)  Vertebral osteomyelitis (HCC) Management as outlined  Right inguinal hernia S/p reduction in the ED.   --Pain control as needed --Monitor closely for signs of bowel obstruction or incarceration --Surgical consulted if recurrent - continue laxative regimen  Insomnia Trazodone at bedtime ordered.  Hypotension BP as low as 87/61 on AM of 7/13, suspect due to pain medications.  Otherwise no signs of sepsis. --Maintain MAP > 65 with fluids if needed --Monitor closely   Old records reviewed in assessment of this patient  Antimicrobials: Cefepime 7/12 >> 7/12 Vanc 7/12 >> 7/12 Nafcillin 7/12 >> current   DVT prophylaxis:  enoxaparin (LOVENOX) injection 40 mg Start: 03/15/22 0800   Code Status:   Code Status: Full Code  Mobility Assessment (last 72 hours)     Mobility Assessment     Row Name 03/21/22 1935 03/20/22 1600         Does patient have an order for bedrest or is patient medically unstable No -  Continue assessment No - Continue assessment      What is the highest level of mobility based on the progressive mobility assessment? Level 6 (Walks  independently in room and hall) - Balance while walking in room without assist - Complete Level 6 (Walks independently in room and hall) - Balance while walking in room without assist - Complete               Disposition Plan:  Remaining in hospital for duration of IV abx Status is: Inpt  Objective: Blood pressure 101/67, pulse 79, temperature 98.2 F (36.8 C), resp. rate 20, height 6\' 4"  (1.93 m), weight 88.5 kg, SpO2 98 %.  Examination:  Physical Exam Constitutional:      General: He is not in acute distress.    Appearance: Normal appearance.  HENT:     Head: Normocephalic and atraumatic.     Mouth/Throat:     Mouth: Mucous membranes are moist.  Eyes:     Extraocular Movements: Extraocular movements intact.  Cardiovascular:     Rate and Rhythm: Normal rate and regular rhythm.     Heart sounds: Normal heart sounds.  Pulmonary:     Effort: Pulmonary effort is normal. No respiratory distress.     Breath sounds: Normal breath sounds. No wheezing.  Abdominal:     General: Bowel sounds are normal. There is no distension.     Palpations: Abdomen is soft.     Tenderness: There is no abdominal tenderness.  Musculoskeletal:     Cervical back: Normal range of motion and neck supple.     Comments: TTP over mid T spine and lower L spine  Skin:    General: Skin is warm and dry.  Neurological:     General: No focal deficit present.     Mental Status: He is alert.  Psychiatric:        Mood and Affect: Mood normal.        Behavior: Behavior normal.      Consultants:  ID Neurosurgery  Procedures:    Data Reviewed: Results for orders placed or performed during the hospital encounter of 03/15/22 (from the past 24 hour(s))  Creatinine, serum     Status: None   Collection Time: 03/22/22  4:53 AM  Result Value Ref Range   Creatinine, Ser 0.88 0.61 - 1.24 mg/dL   GFR, Estimated 03/24/22 >79 mL/min  Hepatic function panel     Status: Abnormal   Collection Time: 03/22/22  4:53 AM   Result Value Ref Range   Total Protein 6.6 6.5 - 8.1 g/dL   Albumin 2.6 (L) 3.5 - 5.0 g/dL   AST 53 (H) 15 - 41 U/L   ALT 58 (H) 0 - 44 U/L   Alkaline Phosphatase 77 38 - 126 U/L   Total Bilirubin 0.6 0.3 - 1.2 mg/dL   Bilirubin, Direct 03/24/22 0.0 - 0.2 mg/dL   Indirect Bilirubin NOT CALCULATED 0.3 - 0.9 mg/dL    I have Reviewed nursing notes, Vitals, and Lab results since pt's last encounter. Pertinent lab results : see above I have ordered test including BMP, CBC, Mg I have reviewed the last note from staff over past 24 hours I have discussed pt's care plan and test results with nursing staff, case manager   LOS: 7 days   <2.1, MD [facility] 03/22/2022, 12:35 PM

## 2022-03-22 NOTE — Evaluation (Signed)
Physical Therapy Evaluation Patient Details Name: Derek Dunn MRN: [id number] DOB: 1971-03-25 Today's Date: 03/22/2022  History of Present Illness  Pt is a 60 male that presented to ED from [facility] 7/12 with complaints of worsening back pain and recurrent right groin pain. 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 PMH of polysubstance abuse including IV drug use in the past, housing insecurity.   Clinical Impression  Patient alert, oriented x4, agreeable to mobility with some encouragement. Pt reported he was in jail prior to this, and is unaware of discharge plans and has no assistance. Independent with ADLs and ambulation.  The patient was educated on log roll technique, able to perform with CGA and step by step cueing. Fair sitting balance, pt propped with BUE due to elevated pain. Did report 7/10 thoracic pain and L hip anterior pain throughout mobility attempts. Educated on RW use and hand placement, able to stand with minA for steadying assist. CGA with RW ~45ft, but deferred any further ambulation at this time.  Overall the patient demonstrated deficits (see "PT Problem List") that impede the patient's functional abilities, safety, and mobility and would benefit from skilled PT intervention. Recommendation is SNF due to decline from PLOF but anticipate improvement in pain management would lead to an improvement in mobility.        Recommendations for follow up therapy are one component of a multi-disciplinary discharge planning process, led by the attending physician.  Recommendations may be updated based on patient status, additional functional criteria and insurance authorization.  Follow Up Recommendations Skilled nursing-short term rehab (<3 hours/day) Can patient physically be transported by private vehicle: Yes     Assistance Recommended at Discharge Intermittent Supervision/Assistance  Patient can return home with the following  A little help with walking and/or transfers;A little help with bathing/dressing/bathroom;Assistance with cooking/housework;Assist for transportation;Help with stairs or ramp for entrance    Equipment Recommendations Rolling walker (2 wheels)  Recommendations for Other Services       Functional Status Assessment Patient has had a recent decline in their functional status and demonstrates the ability to make significant improvements in function in a reasonable and predictable amount of time.     Precautions / Restrictions Precautions Precautions: Fall Restrictions Weight Bearing Restrictions: No      Mobility  Bed Mobility Overal bed mobility: Needs Assistance Bed Mobility: Rolling, Sidelying to Sit, Sit to Sidelying Rolling: Min guard Sidelying to sit: Min guard     Sit to sidelying: HOB elevated, Min guard      Transfers Overall transfer level: Needs assistance Equipment used: Rolling walker (2 wheels) Transfers: Sit to/from Stand Sit to Stand: Min assist           General transfer comment: steadying assist cues for hand placement    Ambulation/Gait Ambulation/Gait assistance: Min guard Gait Distance (Feet): 3 Feet Assistive device: Rolling walker (2 wheels)         General Gait Details: further mobility deferred due to increased pain  Stairs            Wheelchair Mobility    Modified Rankin (Stroke Patients Only)       Balance Overall balance assessment: Needs assistance Sitting-balance support: Feet supported Sitting balance-Leahy Scale: Fair     Standing balance support: Reliant on assistive device for balance Standing balance-Leahy Scale: Poor  Pertinent Vitals/Pain Pain Assessment Pain Assessment: 0-10 Pain Score: 7  Pain Location: mid back, flank pain, L hip Pain  Descriptors / Indicators: Burning, Aching, Sore Pain Intervention(s): Limited activity within patient's tolerance, Monitored during session, Repositioned    Home Living Family/patient expects to be discharged to:: Unsure                   Additional Comments: pt reported he does not know where he is going to stay at discharge    Prior Function                       Hand Dominance        Extremity/Trunk Assessment   Upper Extremity Assessment Upper Extremity Assessment: Generalized weakness    Lower Extremity Assessment Lower Extremity Assessment: Generalized weakness    Cervical / Trunk Assessment Cervical / Trunk Assessment: Kyphotic  Communication      Cognition Arousal/Alertness: Awake/alert Behavior During Therapy: WFL for tasks assessed/performed Overall Cognitive Status: Within Functional Limits for tasks assessed                                          General Comments      Exercises Other Exercises Other Exercises: educated on quad sets, glute squeezes, ankle pumps, importance of early mobility/continued mobility, pain management techniques   Assessment/Plan    PT Assessment Patient needs continued PT services  PT Problem List Decreased strength;Decreased mobility;Decreased activity tolerance;Decreased balance;Pain;Decreased knowledge of use of DME       PT Treatment Interventions DME instruction;Therapeutic exercise;Gait training;Balance training;Stair training;Neuromuscular re-education;Functional mobility training;Therapeutic activities;Patient/family education    PT Goals (Current goals can be found in the Care Plan section)  Acute Rehab PT Goals Patient Stated Goal: to have less pain PT Goal Formulation: With patient Time For Goal Achievement: 04/05/22 Potential to Achieve Goals: Good    Frequency Min 2X/week     Co-evaluation               AM-PAC PT "6 Clicks" Mobility  Outcome Measure Help needed  turning from your back to your side while in a flat bed without using bedrails?: A Little Help needed moving from lying on your back to sitting on the side of a flat bed without using bedrails?: A Little Help needed moving to and from a bed to a chair (including a wheelchair)?: A Little Help needed standing up from a chair using your arms (e.g., wheelchair or bedside chair)?: A Little Help needed to walk in hospital room?: A Little Help needed climbing 3-5 steps with a railing? : A Lot 6 Click Score: 17    End of Session Equipment Utilized During Treatment: Gait belt Activity Tolerance: Patient limited by pain Patient left: in bed;with call bell/phone within reach;with bed alarm set Nurse Communication: Mobility status PT Visit Diagnosis: Other abnormalities of gait and mobility (R26.89);Difficulty in walking, not elsewhere classified (R26.2);Muscle weakness (generalized) (M62.81);Pain Pain - Right/Left:  (midline) Pain - part of body:  (thoracic area)    Time: 2536-6440 PT Time Calculation (min) (ACUTE ONLY): 12 min   Charges:   PT Evaluation $PT Eval Low Complexity: 1 Low PT Treatments $Therapeutic Activity: 8-22 mins        Olga Coaster PT, DPT 2:47 PM,03/22/22

## 2022-03-23 DIAGNOSIS — G062 Extradural and subdural abscess, unspecified: Secondary | ICD-10-CM | POA: Diagnosis not present

## 2022-03-23 LAB — ABO/RH: ABO/RH(D): O NEG

## 2022-03-23 NOTE — TOC Progression Note (Signed)
Transition of Care [facility]) - Progression Note    Patient Details  Name: Derek Dunn MRN: [id number] Date of Birth: [birth year]  Transition of Care [place]) CM/SW Contact  Caryn Section, RN Phone Number: 03/23/2022, 4:20 PM  Clinical Narrative:   Patient scheduled for surgery tomorrow.  He will be evaluated after surgery by therapy.         Expected Discharge Plan and Services                                                 Social Determinants of Health (SDOH) Interventions    Readmission Risk Interventions     No data to display

## 2022-03-23 NOTE — Progress Notes (Signed)
Progress Note    Derek Dunn   FIE:[id number]  DOB: 05-09-1971  DOA: 03/15/2022     8 PCP: Pcp, No  Initial CC: back pain  Hospital Course: Mr. Tosi is a 51 year old male with PMH polysubstance abuse including IV drug use in the past, housing insecurity (incarcerated past 2 months, just released (as of 7/12), hospital admission in May 2023 with MSSA bacteremia secondary to spinal osteomyelitis and epidural abscess, recurrent right inguinal hernia, history of signing out AMA.  He presented to the ED from [facility] on 03/15/2022 with complaints of worsening back pain and recurrent right groin pain.  No reported fevers.  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  Labs were mostly unremarkable including no leukocytosis and normal lactic acid.  Patient was started on broad-spectrum IV antibiotic coverage and admitted for further evaluation and management.  Interventional radiology was consulted for abscess aspiration for cultures, performed on 7/12. Infectious disease was also consulted.   Neurosurgery consulted for consideration of washout in the OR, given poor likelihood of full resolution with antibiotics alone.  Monitoring neurologic status while getting IV antibiotics, and they may take patient to OR on Friday.  Interval History:  No events overnight.  Understands plan is for washout in the OR tomorrow with neurosurgery.  He also had a bowel movement after bowel regimen was changed yesterday which he was happy about.  Assessment and Plan: * Epidural abscess Acute osteomyelitis discitis and facet joint septic arthritis at T8-9 with epidural abscess. Severe spinal stenosis at T8-T9. Acute septic arthritis at the left L5-S1 facet. History of IV drug use. Prior history MSSA bacteremia. --Initially on IV Cefepime, Vancomycin -- IR consulted,  performed CT-guided aspiration for culture on 7/12 --Infectious disease consulted, appreciate antibiotic recommendations.   --On nafcillin continuous IV infusion --Neurosurgery consulted, for consideration of surgical washout/intervention --Repeat T-spine film; ?increased kyphosis and lower lung volumes - OR planned for Friday for washout and fixation --Pain control per orders  --Monitor fever curve, CBC  Septic arthritis (HCC) Signs of septic arthritis at level of L5-S1 seen on MRI lumbar spine.  Management as outlined, see epidural abscess.  Discitis thoracic region See epidural abscess for plan  Back pain Secondary to spinal infection as outlined. -- Treat infection as outlined -- Pain control as needed per orders:  Scheduled Tylenol Scheduled gabapentin As needed PO oxycodone IV dilaudid for breakthrough only (morphine was not effective)  Vertebral osteomyelitis (HCC) Management as outlined  Right inguinal hernia S/p reduction in the ED.   --Pain control as needed --Monitor closely for signs of bowel obstruction or incarceration --Surgical consulted if recurrent - continue laxative regimen  Insomnia Trazodone at bedtime ordered.  Hypotension BP as low as 87/61 on AM of 7/13, suspect due to pain medications.  Otherwise no signs of sepsis. --Maintain MAP > 65 with fluids if needed --Monitor closely   Old records reviewed in assessment of this patient  Antimicrobials: Cefepime 7/12 >> 7/12 Vanc 7/12 >> 7/12 Nafcillin 7/12 >> current   DVT prophylaxis:    Lovenox   Code Status:   Code Status: Full Code  Mobility Assessment (last 72 hours)     Mobility Assessment     Row Name 03/22/22 2035 03/22/22 1442 03/21/22 1935 03/20/22 1600     Does patient have an order for bedrest or is patient medically unstable No - Continue assessment --  No - Continue assessment No - Continue assessment    What is the highest level of mobility based on the progressive  mobility assessment? Level 5 (Walks with assist in room/hall) - Balance while stepping forward/back and can walk in room with assist - Complete Level 5 (Walks with assist in room/hall) - Balance while stepping forward/back and can walk in room with assist - Complete Level 6 (Walks independently in room and hall) - Balance while walking in room without assist - Complete Level 6 (Walks independently in room and hall) - Balance while walking in room without assist - Complete             Disposition Plan:  Remaining in hospital for duration of IV abx Status is: Inpt  Objective: Blood pressure 99/67, pulse 79, temperature 98.3 F (36.8 C), resp. rate 17, height 6\' 4"  (1.93 m), weight 88.5 kg, SpO2 96 %.  Examination:  Physical Exam Constitutional:      General: He is not in acute distress.    Appearance: Normal appearance.  HENT:     Head: Normocephalic and atraumatic.     Mouth/Throat:     Mouth: Mucous membranes are moist.  Eyes:     Extraocular Movements: Extraocular movements intact.  Cardiovascular:     Rate and Rhythm: Normal rate and regular rhythm.     Heart sounds: Normal heart sounds.  Pulmonary:     Effort: Pulmonary effort is normal. No respiratory distress.     Breath sounds: Normal breath sounds. No wheezing.  Abdominal:     General: Bowel sounds are normal. There is no distension.     Palpations: Abdomen is soft.     Tenderness: There is no abdominal tenderness.  Musculoskeletal:     Cervical back: Normal range of motion and neck supple.     Comments: TTP over mid T spine and lower L spine  Skin:    General: Skin is warm and dry.  Neurological:     General: No focal deficit present.     Mental Status: He is alert.  Psychiatric:        Mood and Affect: Mood normal.        Behavior: Behavior normal.      Consultants:  ID Neurosurgery  Procedures:    Data Reviewed: No results found for this or any previous visit (from the past 24 hour(s)).   I have  Reviewed nursing notes, Vitals, and Lab results since pt's last encounter. Pertinent lab results : see above I have ordered test including BMP, CBC, Mg I have reviewed the last note from staff over past 24 hours I have discussed pt's care plan and test results with nursing staff, case manager   LOS: 8 days   , MD [facility] 03/23/2022, 3:37 PM

## 2022-03-24 ENCOUNTER — Inpatient Hospital Stay

## 2022-03-24 ENCOUNTER — Other Ambulatory Visit: Payer: Self-pay

## 2022-03-24 ENCOUNTER — Encounter
Admission: EM | Disposition: A | Discharge status: Home / Self Care | Payer: Self-pay | Source: Home / Self Care | Attending: Internal Medicine

## 2022-03-24 ENCOUNTER — Inpatient Hospital Stay: Admitting: Anesthesiology

## 2022-03-24 ENCOUNTER — Encounter: Payer: Self-pay | Admitting: Internal Medicine

## 2022-03-24 DIAGNOSIS — M4014 Other secondary kyphosis, thoracic region: Secondary | ICD-10-CM | POA: Diagnosis not present

## 2022-03-24 DIAGNOSIS — M4644 Discitis, unspecified, thoracic region: Secondary | ICD-10-CM | POA: Diagnosis not present

## 2022-03-24 DIAGNOSIS — G062 Extradural and subdural abscess, unspecified: Secondary | ICD-10-CM | POA: Diagnosis not present

## 2022-03-24 DIAGNOSIS — M532X4 Spinal instabilities, thoracic region: Secondary | ICD-10-CM | POA: Diagnosis not present

## 2022-03-24 DIAGNOSIS — M0008 Staphylococcal arthritis, vertebrae: Secondary | ICD-10-CM

## 2022-03-24 DIAGNOSIS — M4624 Osteomyelitis of vertebra, thoracic region: Secondary | ICD-10-CM | POA: Diagnosis not present

## 2022-03-24 DIAGNOSIS — M4620 Osteomyelitis of vertebra, site unspecified: Secondary | ICD-10-CM | POA: Diagnosis not present

## 2022-03-24 LAB — CBC WITH DIFFERENTIAL/PLATELET
Abs Immature Granulocytes: 0.05 10*3/uL (ref 0.00–0.07)
Basophils Absolute: 0 10*3/uL (ref 0.0–0.1)
Basophils Relative: 1 %
Eosinophils Absolute: 0.1 10*3/uL (ref 0.0–0.5)
Eosinophils Relative: 3 %
HCT: 33.4 % — ABNORMAL LOW (ref 39.0–52.0)
Hemoglobin: 9.9 g/dL — ABNORMAL LOW (ref 13.0–17.0)
Immature Granulocytes: 1 %
Lymphocytes Relative: 22 %
Lymphs Abs: 0.8 10*3/uL (ref 0.7–4.0)
MCH: 25.1 pg — ABNORMAL LOW (ref 26.0–34.0)
MCHC: 29.6 g/dL — ABNORMAL LOW (ref 30.0–36.0)
MCV: 84.6 fL (ref 80.0–100.0)
Monocytes Absolute: 0.5 10*3/uL (ref 0.1–1.0)
Monocytes Relative: 13 %
Neutro Abs: 2.2 10*3/uL (ref 1.7–7.7)
Neutrophils Relative %: 60 %
Platelets: 239 10*3/uL (ref 150–400)
RBC: 3.95 MIL/uL — ABNORMAL LOW (ref 4.22–5.81)
RDW: 17.2 % — ABNORMAL HIGH (ref 11.5–15.5)
WBC: 3.7 10*3/uL — ABNORMAL LOW (ref 4.0–10.5)
nRBC: 0 % (ref 0.0–0.2)

## 2022-03-24 LAB — TROPONIN I (HIGH SENSITIVITY): Troponin I (High Sensitivity): 8 ng/L (ref <18)

## 2022-03-24 LAB — BASIC METABOLIC PANEL
Anion gap: 4 — ABNORMAL LOW (ref 5–15)
BUN: 21 mg/dL — ABNORMAL HIGH (ref 6–20)
CO2: 28 mmol/L (ref 22–32)
Calcium: 9 mg/dL (ref 8.9–10.3)
Chloride: 108 mmol/L (ref 98–111)
Creatinine, Ser: 1.09 mg/dL (ref 0.61–1.24)
GFR, Estimated: >60 mL/min (ref >60)
Glucose, Bld: 100 mg/dL — ABNORMAL HIGH (ref 70–99)
Potassium: 5 mmol/L (ref 3.5–5.1)
Sodium: 140 mmol/L (ref 135–145)

## 2022-03-24 LAB — URINE DRUG SCREEN, QUALITATIVE (ARMC ONLY)
Amphetamines, Ur Screen: NOT DETECTED
Barbiturates, Ur Screen: NOT DETECTED
Benzodiazepine, Ur Scrn: NOT DETECTED
Cannabinoid 50 Ng, Ur ~~LOC~~: NOT DETECTED
Cocaine Metabolite,Ur ~~LOC~~: NOT DETECTED
MDMA (Ecstasy)Ur Screen: NOT DETECTED
Methadone Scn, Ur: NOT DETECTED
Opiate, Ur Screen: POSITIVE — AB
Phencyclidine (PCP) Ur S: NOT DETECTED
Tricyclic, Ur Screen: NOT DETECTED

## 2022-03-24 LAB — MAGNESIUM: Magnesium: 2.3 mg/dL (ref 1.7–2.4)

## 2022-03-24 SURGERY — POSTERIOR THORACIC FUSION 4 LEVELS
Anesthesia: General | Site: Thoracic

## 2022-03-24 MED ORDER — VANCOMYCIN HCL 1000 MG IV SOLR
INTRAVENOUS | Status: AC
  Filled 2022-03-24: qty 20

## 2022-03-24 MED ORDER — BUPIVACAINE HCL (PF) 0.5 % IJ SOLN
INTRAMUSCULAR | Status: AC
  Filled 2022-03-24: qty 30

## 2022-03-24 MED ORDER — METHOCARBAMOL 1000 MG/10ML IJ SOLN
INTRAMUSCULAR | Status: DC | PRN
  Administered 2022-03-24: 1000 mg via INTRAVENOUS

## 2022-03-24 MED ORDER — HYDROMORPHONE HCL 1 MG/ML IJ SOLN
INTRAMUSCULAR | Status: DC | PRN
  Administered 2022-03-24 (×4): .5 mg via INTRAVENOUS

## 2022-03-24 MED ORDER — REMIFENTANIL HCL 1 MG IV SOLR
INTRAVENOUS | Status: AC
  Filled 2022-03-24: qty 1000

## 2022-03-24 MED ORDER — PROPOFOL 10 MG/ML IV BOLUS
INTRAVENOUS | Status: AC
  Filled 2022-03-24: qty 20

## 2022-03-24 MED ORDER — OXYCODONE HCL 5 MG/5ML PO SOLN: 5.0000 mg | Freq: Once | ORAL | Status: DC | PRN

## 2022-03-24 MED ORDER — PHENYLEPHRINE 80 MCG/ML (10ML) SYRINGE FOR IV PUSH (FOR BLOOD PRESSURE SUPPORT)
PREFILLED_SYRINGE | INTRAVENOUS | Status: DC | PRN
  Administered 2022-03-24: 160 ug via INTRAVENOUS
  Administered 2022-03-24: 80 ug via INTRAVENOUS

## 2022-03-24 MED ORDER — ONDANSETRON HCL 4 MG/2ML IJ SOLN
INTRAMUSCULAR | Status: DC | PRN
  Administered 2022-03-24: 4 mg via INTRAVENOUS

## 2022-03-24 MED ORDER — 0.9 % SODIUM CHLORIDE (POUR BTL) OPTIME
TOPICAL | Status: DC | PRN
  Administered 2022-03-24: 1000 mL

## 2022-03-24 MED ORDER — PROPOFOL 1000 MG/100ML IV EMUL
INTRAVENOUS | Status: AC
  Filled 2022-03-24: qty 100

## 2022-03-24 MED ORDER — PHENYLEPHRINE HCL-NACL 20-0.9 MG/250ML-% IV SOLN
INTRAVENOUS | Status: DC | PRN
  Administered 2022-03-24: 40 ug/min via INTRAVENOUS

## 2022-03-24 MED ORDER — BUPIVACAINE-EPINEPHRINE (PF) 0.5% -1:200000 IJ SOLN
INTRAMUSCULAR | Status: DC | PRN
  Administered 2022-03-24: 7 mL

## 2022-03-24 MED ORDER — SODIUM CHLORIDE 0.9 % IV SOLN: INTRAVENOUS | Status: DC | PRN

## 2022-03-24 MED ORDER — HYDROMORPHONE HCL 1 MG/ML IJ SOLN
1.0000 mg | INTRAMUSCULAR | Status: DC | PRN
  Administered 2022-03-24 – 2022-04-07 (×66): 1 mg via INTRAVENOUS
  Filled 2022-03-24 (×68): qty 1

## 2022-03-24 MED ORDER — DEXAMETHASONE SODIUM PHOSPHATE 10 MG/ML IJ SOLN
INTRAMUSCULAR | Status: AC
  Filled 2022-03-24: qty 1

## 2022-03-24 MED ORDER — PROPOFOL 10 MG/ML IV BOLUS
INTRAVENOUS | Status: DC | PRN
  Administered 2022-03-24: 160 mg via INTRAVENOUS
  Administered 2022-03-24 (×2): 40 mg via INTRAVENOUS

## 2022-03-24 MED ORDER — VANCOMYCIN HCL 1000 MG IV SOLR
INTRAVENOUS | Status: DC | PRN
  Administered 2022-03-24: 1000 mg via TOPICAL

## 2022-03-24 MED ORDER — MIDAZOLAM HCL 2 MG/2ML IJ SOLN
INTRAMUSCULAR | Status: AC
  Filled 2022-03-24: qty 2

## 2022-03-24 MED ORDER — LIDOCAINE HCL (PF) 2 % IJ SOLN
INTRAMUSCULAR | Status: AC
  Filled 2022-03-24: qty 5

## 2022-03-24 MED ORDER — FENTANYL CITRATE (PF) 100 MCG/2ML IJ SOLN
INTRAMUSCULAR | Status: AC
  Administered 2022-03-24: 50 ug via INTRAVENOUS
  Filled 2022-03-24: qty 2

## 2022-03-24 MED ORDER — DEXMEDETOMIDINE (PRECEDEX) IN NS 20 MCG/5ML (4 MCG/ML) IV SYRINGE
PREFILLED_SYRINGE | INTRAVENOUS | Status: DC | PRN
  Administered 2022-03-24 (×3): 8 ug via INTRAVENOUS
  Administered 2022-03-24 (×2): 4 ug via INTRAVENOUS

## 2022-03-24 MED ORDER — LIDOCAINE HCL (CARDIAC) PF 100 MG/5ML IV SOSY
PREFILLED_SYRINGE | INTRAVENOUS | Status: DC | PRN
  Administered 2022-03-24: 80 mg via INTRAVENOUS

## 2022-03-24 MED ORDER — SODIUM CHLORIDE FLUSH 0.9 % IV SOLN
INTRAVENOUS | Status: AC
  Filled 2022-03-24: qty 20

## 2022-03-24 MED ORDER — FENTANYL CITRATE (PF) 100 MCG/2ML IJ SOLN
25.0000 ug | INTRAMUSCULAR | Status: DC | PRN
  Administered 2022-03-24 (×2): 50 ug via INTRAVENOUS

## 2022-03-24 MED ORDER — FENTANYL CITRATE (PF) 100 MCG/2ML IJ SOLN
INTRAMUSCULAR | Status: DC | PRN
  Administered 2022-03-24 (×2): 50 ug via INTRAVENOUS

## 2022-03-24 MED ORDER — OXYCODONE HCL 5 MG PO TABS: 5.0000 mg | ORAL_TABLET | Freq: Once | ORAL | Status: DC | PRN

## 2022-03-24 MED ORDER — SURGIRINSE WOUND IRRIGATION SYSTEM - OPTIME
TOPICAL | Status: DC | PRN
  Administered 2022-03-24: 450 mL

## 2022-03-24 MED ORDER — SUCCINYLCHOLINE CHLORIDE 200 MG/10ML IV SOSY
PREFILLED_SYRINGE | INTRAVENOUS | Status: DC | PRN
  Administered 2022-03-24: 100 mg via INTRAVENOUS

## 2022-03-24 MED ORDER — HYDROMORPHONE HCL 1 MG/ML IJ SOLN
INTRAMUSCULAR | Status: AC
  Filled 2022-03-24: qty 1

## 2022-03-24 MED ORDER — REMIFENTANIL HCL 1 MG IV SOLR
INTRAVENOUS | Status: DC | PRN
  Administered 2022-03-24: .1 ug/kg/min via INTRAVENOUS

## 2022-03-24 MED ORDER — BUPIVACAINE LIPOSOME 1.3 % IJ SUSP
INTRAMUSCULAR | Status: AC
  Filled 2022-03-24: qty 20

## 2022-03-24 MED ORDER — BUPIVACAINE-EPINEPHRINE (PF) 0.5% -1:200000 IJ SOLN
INTRAMUSCULAR | Status: AC
  Filled 2022-03-24: qty 30

## 2022-03-24 MED ORDER — ONDANSETRON HCL 4 MG/2ML IJ SOLN
INTRAMUSCULAR | Status: AC
  Filled 2022-03-24: qty 2

## 2022-03-24 MED ORDER — KETAMINE HCL 50 MG/5ML IJ SOSY
PREFILLED_SYRINGE | INTRAMUSCULAR | Status: AC
  Filled 2022-03-24: qty 5

## 2022-03-24 MED ORDER — METHOCARBAMOL 1000 MG/10ML IJ SOLN
1000.0000 mg | Freq: Once | INTRAVENOUS | Status: DC
  Filled 2022-03-24: qty 10

## 2022-03-24 MED ORDER — SURGIFLO WITH THROMBIN (HEMOSTATIC MATRIX KIT) OPTIME
TOPICAL | Status: DC | PRN
  Administered 2022-03-24: 1 via TOPICAL

## 2022-03-24 MED ORDER — LACTATED RINGERS IV SOLN: INTRAVENOUS | Status: DC | PRN

## 2022-03-24 MED ORDER — DEXAMETHASONE SODIUM PHOSPHATE 10 MG/ML IJ SOLN
INTRAMUSCULAR | Status: DC | PRN
  Administered 2022-03-24: 5 mg via INTRAVENOUS

## 2022-03-24 MED ORDER — KETAMINE HCL 10 MG/ML IJ SOLN
INTRAMUSCULAR | Status: DC | PRN
  Administered 2022-03-24: 30 mg via INTRAVENOUS
  Administered 2022-03-24: 10 mg via INTRAVENOUS
  Administered 2022-03-24: 20 mg via INTRAVENOUS

## 2022-03-24 MED ORDER — FENTANYL CITRATE (PF) 100 MCG/2ML IJ SOLN
INTRAMUSCULAR | Status: AC
  Administered 2022-03-24: 25 ug via INTRAVENOUS
  Filled 2022-03-24: qty 2

## 2022-03-24 MED ORDER — FENTANYL CITRATE (PF) 100 MCG/2ML IJ SOLN
INTRAMUSCULAR | Status: AC
  Filled 2022-03-24: qty 2

## 2022-03-24 MED ORDER — MIDAZOLAM HCL 5 MG/5ML IJ SOLN
INTRAMUSCULAR | Status: DC | PRN
  Administered 2022-03-24: 2 mg via INTRAVENOUS

## 2022-03-24 MED ORDER — KETAMINE HCL 50 MG/5ML IJ SOSY
PREFILLED_SYRINGE | INTRAMUSCULAR | Status: AC
Start: 2022-03-24 — End: ?
  Filled 2022-03-24: qty 5

## 2022-03-24 MED ORDER — PROPOFOL 500 MG/50ML IV EMUL
INTRAVENOUS | Status: DC | PRN
  Administered 2022-03-24: 150 ug/kg/min via INTRAVENOUS

## 2022-03-24 SURGICAL SUPPLY — 78 items
ALLOGRAFT BONE FIBER KORE 10CC (Bone Implant) ×2 IMPLANT
BASIN KIT SINGLE STR (MISCELLANEOUS) ×2 IMPLANT
BUR NEURO DRILL SOFT 3.0X3.8M (BURR) ×2 IMPLANT
CAP LOCKING THREADED (Cap) ×10 IMPLANT
CHLORAPREP W/TINT 26 (MISCELLANEOUS) ×5 IMPLANT
CNTNR SPEC 2.5X3XGRAD LEK (MISCELLANEOUS) ×1
CONT SPEC 4OZ STER OR WHT (MISCELLANEOUS) ×2
CONT SPEC 4OZ STRL OR WHT (MISCELLANEOUS) ×1
CONTAINER SPEC 2.5X3XGRAD LEK (MISCELLANEOUS) ×1 IMPLANT
COUNTER NEEDLE 20/40 LG (NEEDLE) ×4 IMPLANT
CUP MEDICINE 2OZ PLAST GRAD ST (MISCELLANEOUS) ×2 IMPLANT
DERMABOND ADVANCED (GAUZE/BANDAGES/DRESSINGS) ×2
DERMABOND ADVANCED .7 DNX12 (GAUZE/BANDAGES/DRESSINGS) ×1 IMPLANT
DRAPE C ARM PK CFD 31 SPINE (DRAPES) ×2 IMPLANT
DRAPE C-ARM 42X72 X-RAY (DRAPES) ×1 IMPLANT
DRAPE LAPAROTOMY 100X77 ABD (DRAPES) ×2 IMPLANT
DRAPE MICROSCOPE SPINE 48X150 (DRAPES) ×1 IMPLANT
DRAPE SURG 17X11 SM STRL (DRAPES) ×2 IMPLANT
ELECT CAUTERY BLADE TIP 2.5 (TIP) ×4
ELECT EZSTD 165MM 6.5IN (MISCELLANEOUS)
ELECT REM PT RETURN 9FT ADLT (ELECTROSURGICAL) ×2
ELECTRODE CAUTERY BLDE TIP 2.5 (TIP) ×2 IMPLANT
ELECTRODE EZSTD 165MM 6.5IN (MISCELLANEOUS) IMPLANT
ELECTRODE REM PT RTRN 9FT ADLT (ELECTROSURGICAL) ×1 IMPLANT
FEE INTRAOP CADWELL SUPPLY NCS (MISCELLANEOUS) IMPLANT
FEE INTRAOP MONITOR IMPULS NCS (MISCELLANEOUS) IMPLANT
GAUZE 4X4 16PLY ~~LOC~~+RFID DBL (SPONGE) ×2 IMPLANT
GLOVE BIOGEL PI IND STRL 6.5 (GLOVE) ×1 IMPLANT
GLOVE BIOGEL PI IND STRL 8.5 (GLOVE) ×1 IMPLANT
GLOVE BIOGEL PI INDICATOR 6.5 (GLOVE) ×2
GLOVE BIOGEL PI INDICATOR 8.5 (GLOVE) ×2
GLOVE SURG SYN 6.5 ES PF (GLOVE) ×4 IMPLANT
GLOVE SURG SYN 6.5 PF PI (GLOVE) ×2 IMPLANT
GLOVE SURG SYN 8.5  E (GLOVE) ×6
GLOVE SURG SYN 8.5 E (GLOVE) ×3 IMPLANT
GLOVE SURG SYN 8.5 PF PI (GLOVE) ×3 IMPLANT
GOWN SRG LRG LVL 4 IMPRV REINF (GOWNS) ×1 IMPLANT
GOWN SRG XL LVL 3 NONREINFORCE (GOWNS) ×1 IMPLANT
GOWN STRL NON-REIN TWL XL LVL3 (GOWNS) ×2
GOWN STRL REIN LRG LVL4 (GOWNS) ×2
GRADUATE 1200CC STRL 31836 (MISCELLANEOUS) ×4 IMPLANT
HEMOVAC 400CC 10FR (MISCELLANEOUS) ×2 IMPLANT
HOLDER FOLEY CATH W/STRAP (MISCELLANEOUS) ×2 IMPLANT
INTRAOP CADWELL SUPPLY FEE NCS (MISCELLANEOUS) ×1
INTRAOP DISP SUPPLY FEE NCS (MISCELLANEOUS) ×2
INTRAOP MONITOR FEE IMPULS NCS (MISCELLANEOUS) ×1
INTRAOP MONITOR FEE IMPULSE (MISCELLANEOUS) ×2
IV NS 1000ML (IV SOLUTION) ×2
IV NS 1000ML BAXH (IV SOLUTION) IMPLANT
KIT SPINAL PRONEVIEW (KITS) ×2 IMPLANT
MANIFOLD NEPTUNE II (INSTRUMENTS) ×2 IMPLANT
MARKER SKIN DUAL TIP RULER LAB (MISCELLANEOUS) ×6 IMPLANT
NDL SAFETY ECLIPSE 18X1.5 (NEEDLE) ×1 IMPLANT
NEEDLE HYPO 18GX1.5 SHARP (NEEDLE) ×2
NS IRRIG 1000ML POUR BTL (IV SOLUTION) ×2 IMPLANT
PACK LAMINECTOMY NEURO (CUSTOM PROCEDURE TRAY) ×2 IMPLANT
PAD ARMBOARD 7.5X6 YLW CONV (MISCELLANEOUS) ×2 IMPLANT
PREVENA INCISION MGT 90 150 (MISCELLANEOUS) ×1 IMPLANT
ROD HEXENDED ALLOY 5.5X500MM (Rod) ×2 IMPLANT
SCREW CREO SPINAL 6.5X45 (Screw) ×10 IMPLANT
SEALER BIPOLAR AQUA 6.0 (INSTRUMENTS) ×1 IMPLANT
SOLUTION IRRIG SURGIPHOR (IV SOLUTION) ×2 IMPLANT
STAPLER SKIN PROX 35W (STAPLE) ×1 IMPLANT
SURGIFLO W/THROMBIN 8M KIT (HEMOSTASIS) ×2 IMPLANT
SUT DVC VLOC 3-0 CL 6 P-12 (SUTURE) ×1 IMPLANT
SUT ETHILON 3-0 (SUTURE) ×2 IMPLANT
SUT V-LOC 90 ABS DVC 3-0 CL (SUTURE) ×2 IMPLANT
SUT VIC AB 0 CT1 18XCR BRD 8 (SUTURE) IMPLANT
SUT VIC AB 0 CT1 27 (SUTURE) ×4
SUT VIC AB 0 CT1 27XCR 8 STRN (SUTURE) ×2 IMPLANT
SUT VIC AB 0 CT1 8-18 (SUTURE) ×4
SUT VIC AB 2-0 CT1 18 (SUTURE) ×5 IMPLANT
SYR 10ML LL (SYRINGE) ×4 IMPLANT
SYR 30ML LL (SYRINGE) ×4 IMPLANT
TOWEL OR 17X26 4PK STRL BLUE (TOWEL DISPOSABLE) ×8 IMPLANT
TRAY FOLEY SLVR 16FR LF STAT (SET/KITS/TRAYS/PACK) ×1 IMPLANT
TUBING CONNECTING 10 (TUBING) ×4 IMPLANT
WATER STERILE IRR 1000ML POUR (IV SOLUTION) ×2 IMPLANT

## 2022-03-24 NOTE — Transfer of Care (Signed)
Immediate Anesthesia Transfer of Care Note  Patient: Derek Dunn  Procedure(s) Performed: T7-12 POSTERIOR THORACIC FUSION, T8-9 LAMINECTOMY FOR WASHOUT OF EPIDURAL ABSCESS (Thoracic)  Patient Location: PACU  Anesthesia Type:General  Level of Consciousness: drowsy  Airway & Oxygen Therapy: Patient Spontanous Breathing and Patient connected to face mask oxygen  Post-op Assessment: Report given to RN and Post -op Vital signs reviewed and stable  Post vital signs: Reviewed and stable  Last Vitals:  Vitals Value Taken Time  BP 115/83 03/24/22 1707  Temp    Pulse 79 03/24/22 1712  Resp 15 03/24/22 1712  SpO2 96 % 03/24/22 1712  Vitals shown include unvalidated device data.  Last Pain:  Vitals:   03/24/22 1316  TempSrc: Oral  PainSc: 8       Patients Stated Pain Goal: 4 (03/23/22 1257)  Complications: No notable events documented.

## 2022-03-24 NOTE — Anesthesia Procedure Notes (Addendum)
Procedure Name: Intubation Date/Time: 03/24/2022 1:50 PM  Performed by: Tammi Klippel, CRNAPre-anesthesia Checklist: Patient identified, Patient being monitored, Timeout performed, Emergency Drugs available and Suction available Patient Re-evaluated:Patient Re-evaluated prior to induction Oxygen Delivery Method: Circle system utilized Preoxygenation: Pre-oxygenation with 100% oxygen Induction Type: IV induction Ventilation: Mask ventilation without difficulty Laryngoscope Size: 3 and McGraph Grade View: Grade I Tube type: Oral Tube size: 7.5 mm Number of attempts: 1 Airway Equipment and Method: Stylet Placement Confirmation: ETT inserted through vocal cords under direct vision, positive ETCO2 and breath sounds checked- equal and bilateral Secured at: 21 cm Tube secured with: Tape Dental Injury: Teeth and Oropharynx as per pre-operative assessment

## 2022-03-24 NOTE — Op Note (Signed)
Indications: Mr. Makela presented with: thoracic discitis/osteomyelitis, thoracic kyphosis, thoracic instability  He had worsening pain and kyphosis prompting surgical intervention  Findings: partial correction of kyphosis  Preoperative Diagnosis: thoracic discitis/osteomyelitis, thoracic kyphosis, thoracic instability Postoperative Diagnosis: same   EBL: 500 ml IVF: see AR ml Drains: 1 placed Disposition: Extubated and Stable to PACU Complications: none  A foley catheter was placed.   Preoperative Note:   Risks of surgery discussed include: infection, bleeding, stroke, coma, death, paralysis, CSF leak, nerve/spinal cord injury, numbness, tingling, weakness, complex regional pain syndrome, recurrent stenosis and/or disc herniation, vascular injury, development of instability, neck/back pain, need for further surgery, persistent symptoms, development of deformity, and the risks of anesthesia. The patient understood these risks and agreed to proceed.  Operative Note:  1. Posterior laminectomy T8-9 for evacuation of epidural abscess 2. Posterolateral arthrodesis T7 to T12 3. Posterior segmental instrumentation T7 to T12 using Globus Creo    The patient was brought to the Operating Room, intubated and turned into the prone position. All pressure points were checked and double checked. Flouroscopy was used to mark the incision. The patient was prepped and draped in the standard fashion. A full timeout was performed. Preoperative antibiotics were given. The incision was injected with local anesthetic.  The incision was opened with a scalpel, then the soft tissues divided with the Bovie. Self-retaining retractors were placed. The paraspinus muscles were reflected laterally in subperiosteal fashion until the transverse processes were visible. Flouroscopy was used to confirm our localization.   The self-retaining retractors were repositioned. We then placed pedicle screws.   At T7 on  one side, a starting point was chosen based on anatomic landmarks, then breached with a high speed drill. A pedicle finder probe was used to cannulate the pedicle, then the balltip probe used to confirm lack of breach. The tract was tapped, re-checked with the balltip probe, then a 6.5 x 45 mm pedicle screw was placed. The procedure was then repeated contralaterally and the same size screw placed. At T8 on one side, a starting point was chosen based on anatomic landmarks, then breached with a high speed drill. A pedicle finder probe was used to cannulate the pedicle, then the balltip probe used to confirm lack of breach. The tract was tapped, re-checked with the balltip probe, then a 6.5 x 45 mm pedicle screw was placed. The procedure was then repeated contralaterally and the same size screw placed. At T10 on one side, a starting point was chosen based on anatomic landmarks, then breached with a high speed drill. A pedicle finder probe was used to cannulate the pedicle, then the balltip probe used to confirm lack of breach. The tract was tapped, re-checked with the balltip probe, then a 6.5 x 45 mm pedicle screw was placed. The procedure was then repeated contralaterally and the same size screw placed. At T11 on one side, a starting point was chosen based on anatomic landmarks, then breached with a high speed drill. A pedicle finder probe was used to cannulate the pedicle, then the balltip probe used to confirm lack of breach. The tract was tapped, re-checked with the balltip probe, then a 6.5 x 45 mm pedicle screw was placed. The procedure was then repeated contralaterally and the same size screw placed. At T12 on one side, a starting point was chosen based on anatomic landmarks, then breached with a high speed drill. A pedicle finder probe was used to cannulate the pedicle, then the balltip probe used to confirm  lack of breach. The tract was tapped, re-checked with the balltip probe, then a 6.5 x 45 mm pedicle  screw was placed. The procedure was then repeated contralaterally and the same size screw placed.  After placement of pedicle screws, the right hemilamina of T8 and T9 were removed and the ligamentum removed.  The epidural abscess fluid was scant, but was washed out.     Rods were measured to length, cut, and shaped. The rods were secured using locking caps to manufacturer's specifications. Final AP and lateral radiographs were taken to confirm placement of instrumentation and appropriate alignment. The wound was copiously irrigated, then the external surfaces of the remaining lamina, facet, and transverse processes from T7 to T12 were decorticated. Allograft was placed over the decorticated surfaces for arthrodesis from T7 to T12.  A drain was placed subfascially.   After hemostasis, the wound was closed in layers with 0 and 2-0 vicryl. 3-0 monocryl and a wound vac were applied to the incision.  The patient was then flipped supine and extubated with incident. All counts were correct times 2 at the end of the case. No immediate complications were noted. Monitoring was stable.   Venetia Night MD

## 2022-03-24 NOTE — Progress Notes (Signed)
PT Cancellation Note  Patient Details Name: Derek Dunn MRN: [id number] DOB: [birth year]   Cancelled Treatment:     PT attempt. Pt is having procedure today. Acute PT will re-eval/ continue to follow as per current POC.     Rushie Chestnut 03/24/2022, 3:33 PM

## 2022-03-24 NOTE — Progress Notes (Signed)
Progress Note    Derek Dunn   PJS:[id number]  DOB: [birth year]  DOA: 03/15/2022     9 PCP: Pcp, No  Initial CC: back pain  Hospital Course: Derek Dunn is a 51 year old male with PMH polysubstance abuse including IV drug use in the past, housing insecurity (incarcerated past 2 months, just released (as of 7/12), hospital admission in May 2023 with MSSA bacteremia secondary to spinal osteomyelitis and epidural abscess, recurrent right inguinal hernia, history of signing out AMA.  He presented to the ED from [facility] on 03/15/2022 with complaints of worsening back pain and recurrent right groin pain.  No reported fevers.  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  Labs were mostly unremarkable including no leukocytosis and normal lactic acid.  Patient was started on broad-spectrum IV antibiotic coverage and admitted for further evaluation and management.  Interventional radiology was consulted for abscess aspiration for cultures, performed on 7/12. Infectious disease was also consulted.   Neurosurgery consulted for consideration of washout in the OR, given poor likelihood of full resolution with antibiotics alone. He was taken to the OR for washout on 03/24/22.   Interval History:  No events overnight.  Seen this morning in his room prior to surgery later in the afternoon.  He was worried about pain control after surgery and I informed him that we will adjust regimen to appropriately treat pain.  Assessment and Plan: * Epidural abscess Acute osteomyelitis discitis and facet joint septic arthritis at T8-9 with epidural abscess. Severe spinal stenosis at T8-T9. Acute septic arthritis at the left L5-S1 facet. History of IV drug use. Prior history MSSA bacteremia. --Initially on IV Cefepime, Vancomycin -- IR consulted, performed CT-guided  aspiration for culture on 7/12 --Infectious disease consulted, appreciate antibiotic recommendations.   --On nafcillin continuous IV infusion --Neurosurgery consulted; undergoing washout on 7/21 --Repeat T-spine film; ?increased kyphosis and lower lung volumes --Pain control per orders  --Monitor fever curve, CBC  Septic arthritis (HCC) Signs of septic arthritis at level of L5-S1 seen on MRI lumbar spine.  Management as outlined, see epidural abscess.  Discitis thoracic region See epidural abscess for plan  Back pain Secondary to spinal infection as outlined. -- Treat infection as outlined -Patient will need adjustment of pain regimen after washout and spinal fusion on 03/24/2022  Vertebral osteomyelitis (HCC) Management as outlined  Right inguinal hernia S/p reduction in the ED.   --Pain control as needed --Monitor closely for signs of bowel obstruction or incarceration --Surgical consulted if recurrent - continue laxative regimen  Insomnia Trazodone at bedtime ordered.  Hypotension BP as low as 87/61 on AM of 7/13, suspect due to pain medications.  Otherwise no signs of sepsis. --Maintain MAP > 65 with fluids if needed --Monitor closely   Old records reviewed in assessment of this patient  Antimicrobials: Cefepime 7/12 >> 7/12 Vanc 7/12 >> 7/12 Nafcillin 7/12 >> current   DVT prophylaxis:  Lovenox   Code Status:   Code Status: Full Code  Mobility Assessment (last 72 hours)     Mobility Assessment     Row Name 03/24/22 0910 03/23/22 1935 03/22/22 2035 03/22/22 1442 03/21/22 1935   Does patient have an order for bedrest or is patient medically unstable No - Continue assessment No - Continue assessment No - Continue assessment -- No - Continue assessment   What is the highest level of mobility based  on the progressive mobility assessment? Level 6 (Walks independently in room and hall) - Balance while walking in room without assist - Complete Level 6 (Walks  independently in room and hall) - Balance while walking in room without assist - Complete Level 5 (Walks with assist in room/hall) - Balance while stepping forward/back and can walk in room with assist - Complete Level 5 (Walks with assist in room/hall) - Balance while stepping forward/back and can walk in room with assist - Complete Level 6 (Walks independently in room and hall) - Balance while walking in room without assist - Complete            Disposition Plan:  Remaining in hospital for duration of IV abx Status is: Inpt  Objective: Blood pressure 118/83, pulse 74, temperature 97.8 F (36.6 C), temperature source Oral, resp. rate 18, height 6\' 4"  (1.93 m), weight 88.5 kg, SpO2 96 %.  Examination:  Physical Exam Constitutional:      General: He is not in acute distress.    Appearance: Normal appearance.  HENT:     Head: Normocephalic and atraumatic.     Mouth/Throat:     Mouth: Mucous membranes are moist.  Eyes:     Extraocular Movements: Extraocular movements intact.  Cardiovascular:     Rate and Rhythm: Normal rate and regular rhythm.     Heart sounds: Normal heart sounds.  Pulmonary:     Effort: Pulmonary effort is normal. No respiratory distress.     Breath sounds: Normal breath sounds. No wheezing.  Abdominal:     General: Bowel sounds are normal. There is no distension.     Palpations: Abdomen is soft.     Tenderness: There is no abdominal tenderness.  Musculoskeletal:     Cervical back: Normal range of motion and neck supple.     Comments: TTP over mid T spine and lower L spine  Skin:    General: Skin is warm and dry.  Neurological:     General: No focal deficit present.     Mental Status: He is alert.  Psychiatric:        Mood and Affect: Mood normal.        Behavior: Behavior normal.      Consultants:  ID Neurosurgery  Procedures:    Data Reviewed: Results for orders placed or performed during the hospital encounter of 03/15/22 (from the past 24  hour(s))  Type and screen     Status: None (Preliminary result)   Collection Time: 03/23/22  6:28 PM  Result Value Ref Range   ABO/RH(D) O NEG    Antibody Screen POS    Sample Expiration 03/26/2022,2359    Antibody Identification      NON SPECIFIC ANTIBODY REACTIVITY Performed at [facility], [address] [place], [place]    Unit Number [id number]    Blood Component Type RED CELLS,LR    Unit division 00    Status of Unit REL FROM [facility]    Transfusion Status OK TO TRANSFUSE    Crossmatch Result COMPATIBLE    Unit Number [facility]    Blood Component Type RBC LR PHER1    Unit division 00    Status of Unit REL FROM [facility]    Transfusion Status DO NOT ISSUE FOR TRANSFUSION    Crossmatch Result NOT NEEDED    Unit Number [facility]    Blood Component Type RED CELLS,LR    Unit division 00    Status of Unit ALLOCATED  Transfusion Status OK TO TRANSFUSE    Crossmatch Result COMPATIBLE   Basic metabolic panel     Status: Abnormal   Collection Time: 03/24/22  5:37 AM  Result Value Ref Range   Sodium 140 135 - 145 mmol/L   Potassium 5.0 3.5 - 5.1 mmol/L   Chloride 108 98 - 111 mmol/L   CO2 28 22 - 32 mmol/L   Glucose, Bld 100 (H) 70 - 99 mg/dL   BUN 21 (H) 6 - 20 mg/dL   Creatinine, Ser 7.82 0.61 - 1.24 mg/dL   Calcium 9.0 8.9 - 95.6 mg/dL   GFR, Estimated >21 >30 mL/min   Anion gap 4 (L) 5 - 15  CBC with Differential/Platelet     Status: Abnormal   Collection Time: 03/24/22  5:37 AM  Result Value Ref Range   WBC 3.7 (L) 4.0 - 10.5 K/uL   RBC 3.95 (L) 4.22 - 5.81 MIL/uL   Hemoglobin 9.9 (L) 13.0 - 17.0 g/dL   HCT 86.5 (L) 78.4 - 69.6 %   MCV 84.6 80.0 - 100.0 fL   MCH 25.1 (L) 26.0 - 34.0 pg   MCHC 29.6 (L) 30.0 - 36.0 g/dL   RDW 29.5 (H) 28.4 - 13.2 %   Platelets 239 150 - 400 K/uL   nRBC 0.0 0.0 - 0.2 %   Neutrophils Relative % 60 %   Neutro Abs 2.2 1.7 - 7.7 K/uL   Lymphocytes Relative 22 %   Lymphs Abs 0.8 0.7 - 4.0 K/uL   Monocytes  Relative 13 %   Monocytes Absolute 0.5 0.1 - 1.0 K/uL   Eosinophils Relative 3 %   Eosinophils Absolute 0.1 0.0 - 0.5 K/uL   Basophils Relative 1 %   Basophils Absolute 0.0 0.0 - 0.1 K/uL   Immature Granulocytes 1 %   Abs Immature Granulocytes 0.05 0.00 - 0.07 K/uL  Magnesium     Status: None   Collection Time: 03/24/22  5:37 AM  Result Value Ref Range   Magnesium 2.3 1.7 - 2.4 mg/dL  Urine Drug Screen, Qualitative ([facility] only)     Status: Abnormal   Collection Time: 03/24/22 11:38 AM  Result Value Ref Range   Tricyclic, Ur Screen NONE DETECTED NONE DETECTED   Amphetamines, Ur Screen NONE DETECTED NONE DETECTED   MDMA (Ecstasy)Ur Screen NONE DETECTED NONE DETECTED   Cocaine Metabolite,Ur [place] NONE DETECTED NONE DETECTED   Opiate, Ur Screen POSITIVE (A) NONE DETECTED   Phencyclidine (PCP) Ur S NONE DETECTED NONE DETECTED   Cannabinoid 50 Ng, Ur [place] NONE DETECTED NONE DETECTED   Barbiturates, Ur Screen NONE DETECTED NONE DETECTED   Benzodiazepine, Ur Scrn NONE DETECTED NONE DETECTED   Methadone Scn, Ur NONE DETECTED NONE DETECTED     I have Reviewed nursing notes, Vitals, and Lab results since pt's last encounter. Pertinent lab results : see above I have ordered test including BMP, CBC, Mg I have reviewed the last note from staff over past 24 hours I have discussed pt's care plan and test results with nursing staff, case manager   LOS: 9 days   Lewie Chamber, MD [facility] 03/24/2022, 4:12 PM

## 2022-03-24 NOTE — Anesthesia Preprocedure Evaluation (Signed)
Anesthesia Evaluation  Patient identified by MRN, date of birth, ID band Patient awake    Reviewed: Allergy & Precautions, NPO status , Patient's Chart, lab work & pertinent test results  Airway Mallampati: III  TM Distance: >3 FB Neck ROM: full    Dental  (+) Poor Dentition   Pulmonary neg pulmonary ROS, Current Smoker,    Pulmonary exam normal        Cardiovascular negative cardio ROS Normal cardiovascular exam     Neuro/Psych negative neurological ROS  negative psych ROS   GI/Hepatic negative GI ROS, Neg liver ROS,   Endo/Other  negative endocrine ROS  Renal/GU      Musculoskeletal   Abdominal   Peds  Hematology negative hematology ROS (+)   Anesthesia Other Findings Past Medical History: 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 No pertinent surgical history.  BMI    Body Mass Index: 23.75 kg/m      Reproductive/Obstetrics negative OB ROS                             Anesthesia Physical Anesthesia Plan  ASA: 3  Anesthesia Plan: General/Spinal   Post-op Pain Management:    Induction: Intravenous  PONV Risk Score and Plan: 2 and Ondansetron, Propofol infusion, TIVA and Midazolam  Airway Management Planned: Oral ETT  Additional Equipment:   Intra-op Plan:   Post-operative Plan: Extubation in OR  Informed Consent: I have reviewed the patients History and Physical, chart, labs and discussed the procedure including the risks, benefits and alternatives for the proposed anesthesia with the patient or authorized representative who has indicated his/her understanding and acceptance.     Dental Advisory Given  Plan Discussed with: Anesthesiologist, CRNA and Surgeon  Anesthesia Plan Comments: (Patient consented for risks of anesthesia including but not limited to:  - adverse reactions to medications - damage to eyes, teeth, lips or other oral  mucosa - nerve damage due to positioning  - sore throat or hoarseness - Damage to heart, brain, nerves, lungs, other parts of body or loss of life  Patient voiced understanding.)        Anesthesia Quick Evaluation

## 2022-03-24 NOTE — Interval H&P Note (Signed)
History and Physical Interval Note:  03/24/2022 11:43 AM  Derek Dunn  has presented today for surgery, with the diagnosis of thoracic discitis/osteomyelitis, thoracic kyphosis, thoracic instability.  The various methods of treatment have been discussed with the patient and family. After consideration of risks, benefits and other options for treatment, the patient has consented to T7-11 posterior fusion with laminectomy for epidural abscess evacuation as a surgical intervention.  The patient's history has been reviewed, patient examined, no change in status, stable for surgery.  I have reviewed the patient's chart and labs.  Questions were answered to the patient's satisfaction.    Heart sounds normal no MRG. Chest Clear to Auscultation Bilaterally.    Markas Aldredge

## 2022-03-24 NOTE — Progress Notes (Signed)
[employer] - Patient initially complained of unrelieved back pain and dilaudid frequency increased to every 2h.  Nurse now reports patient with 10/10 chest pain. VSS. Bedside patient moaning. Describes chest pain is of new onset left side of chest and does not radiate. Reproducible on palpation 3-4 Rib just medial of MCL. No bony deformity appreciated Patient is s/p Posterior laminectomy T8-9 for evacuation of epidural abscess Posterolateral arthrodesis T7 to T12 and Posterior segmental instrumentation T7 to T12 performed in prone position  Action - EKG Chest xray

## 2022-03-24 NOTE — Plan of Care (Signed)

## 2022-03-24 NOTE — Progress Notes (Signed)
   Date of Admission:  03/15/2022   ID: Derek Dunn is a 51 y.o. male Principal Problem:   Epidural abscess Active Problems:   Vertebral osteomyelitis (HCC)   Right inguinal hernia   Back pain   Discitis thoracic region   Septic arthritis (HCC)   Hypotension   Insomnia    Subjective: Is out of surgery and in PACU In pain and also sedated  Medications:   acetaminophen  1,000 mg Oral Q8H   Chlorhexidine Gluconate Cloth  6 each Topical Daily   gabapentin  900 mg Oral TID   mupirocin ointment   Nasal BID   polyethylene glycol  17 g Oral Daily   senna-docusate  1 tablet Oral BID   sorbitol  30 mL Oral Daily   traZODone  50 mg Oral QHS    Objective: Vital signs in last 24 hours: Temp:  [97.7 F (36.5 C)-98.4 F (36.9 C)] 97.7 F (36.5 C) (07/21 0819) Pulse Rate:  [69-88] 69 (07/21 0819) Resp:  [16-18] 17 (07/21 0819) BP: (100-107)/(65-69) 101/66 (07/21 0819) SpO2:  [96 %-97 %] 97 % (07/21 0819)    PHYSICAL EXAM:  General: some agitation Asking for pain  meds Lungs: b/l air entry Heart: Tachycardia. Abdomen: Soft, non-tender,not distended. Bowel sounds normal. No masses Extremities: did not examine   Lab Results Recent Labs    03/22/22 0453 03/24/22 0537  WBC  --  3.7*  HGB  --  9.9*  HCT  --  33.4*  NA  --  140  K  --  5.0  CL  --  108  CO2  --  28  BUN  --  21*  CREATININE 0.88 1.09   Liver Panel Recent Labs    03/22/22 0453  PROT 6.6  ALBUMIN 2.6*  AST 53*  ALT 58*  ALKPHOS 77  BILITOT 0.6  BILIDIR <0.1  IBILI NOT CALCULATED  Microbiology:  Studies/Results: No results found.   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 Posterior laminectomy T8-9 for evacuation of epidural abscess 2. Posterolateral arthrodesis T7 to T12 3. Posterior segmental instrumentation T7  to T12 using Globus Creo  Untreated MSSA bacteremia in May 2023 when he left AMA and was taken to prison the same day and remained incarcerated for 2 months When he was released he came to ED for back pain Polysubstance use- cannot be sent home on PICC   Follow transaminases closely while on nafcillin  ID will follow him peripherally this weekend Call if needed- [facility] available by phone

## 2022-03-25 DIAGNOSIS — G062 Extradural and subdural abscess, unspecified: Secondary | ICD-10-CM | POA: Diagnosis not present

## 2022-03-25 LAB — CBC WITH DIFFERENTIAL/PLATELET
Abs Immature Granulocytes: 0.02 10*3/uL (ref 0.00–0.07)
Basophils Absolute: 0 10*3/uL (ref 0.0–0.1)
Basophils Relative: 1 %
Eosinophils Absolute: 0 10*3/uL (ref 0.0–0.5)
Eosinophils Relative: 1 %
HCT: 32.4 % — ABNORMAL LOW (ref 39.0–52.0)
Hemoglobin: 9.9 g/dL — ABNORMAL LOW (ref 13.0–17.0)
Immature Granulocytes: 0 %
Lymphocytes Relative: 17 %
Lymphs Abs: 0.9 10*3/uL (ref 0.7–4.0)
MCH: 24.9 pg — ABNORMAL LOW (ref 26.0–34.0)
MCHC: 30.6 g/dL (ref 30.0–36.0)
MCV: 81.4 fL (ref 80.0–100.0)
Monocytes Absolute: 0.5 10*3/uL (ref 0.1–1.0)
Monocytes Relative: 9 %
Neutro Abs: 3.8 10*3/uL (ref 1.7–7.7)
Neutrophils Relative %: 72 %
Platelets: 258 10*3/uL (ref 150–400)
RBC: 3.98 MIL/uL — ABNORMAL LOW (ref 4.22–5.81)
RDW: 17.3 % — ABNORMAL HIGH (ref 11.5–15.5)
WBC: 5.2 10*3/uL (ref 4.0–10.5)
nRBC: 0 % (ref 0.0–0.2)

## 2022-03-25 LAB — BASIC METABOLIC PANEL
Anion gap: 7 (ref 5–15)
BUN: 22 mg/dL — ABNORMAL HIGH (ref 6–20)
CO2: 23 mmol/L (ref 22–32)
Calcium: 8.3 mg/dL — ABNORMAL LOW (ref 8.9–10.3)
Chloride: 106 mmol/L (ref 98–111)
Creatinine, Ser: 1 mg/dL (ref 0.61–1.24)
GFR, Estimated: >60 mL/min (ref >60)
Glucose, Bld: 117 mg/dL — ABNORMAL HIGH (ref 70–99)
Potassium: 5.1 mmol/L (ref 3.5–5.1)
Sodium: 136 mmol/L (ref 135–145)

## 2022-03-25 LAB — TROPONIN I (HIGH SENSITIVITY): Troponin I (High Sensitivity): 7 ng/L (ref <18)

## 2022-03-25 LAB — MAGNESIUM: Magnesium: 2 mg/dL (ref 1.7–2.4)

## 2022-03-25 MED ORDER — TRAZODONE HCL 50 MG PO TABS
100.0000 mg | ORAL_TABLET | Freq: Every day | ORAL | Status: DC
  Administered 2022-03-25 – 2022-04-14 (×21): 100 mg via ORAL
  Administered 2022-04-15: 50 mg via ORAL
  Administered 2022-04-16: 100 mg via ORAL
  Administered 2022-04-17: 50 mg via ORAL
  Filled 2022-03-25 (×24): qty 2

## 2022-03-25 MED ORDER — METHOCARBAMOL 500 MG PO TABS
1000.0000 mg | ORAL_TABLET | Freq: Four times a day (QID) | ORAL | Status: DC | PRN
  Administered 2022-03-25 – 2022-04-20 (×66): 1000 mg via ORAL
  Filled 2022-03-25 (×69): qty 2

## 2022-03-25 MED ORDER — ENOXAPARIN SODIUM 40 MG/0.4ML IJ SOSY
40.0000 mg | PREFILLED_SYRINGE | INTRAMUSCULAR | Status: DC
  Administered 2022-03-25 – 2022-04-12 (×19): 40 mg via SUBCUTANEOUS
  Filled 2022-03-25 (×20): qty 0.4

## 2022-03-25 MED ORDER — KETOROLAC TROMETHAMINE 30 MG/ML IJ SOLN
15.0000 mg | Freq: Four times a day (QID) | INTRAMUSCULAR | Status: AC
  Administered 2022-03-25 – 2022-03-30 (×20): 15 mg via INTRAVENOUS
  Filled 2022-03-25 (×20): qty 1

## 2022-03-25 NOTE — Progress Notes (Signed)
Physical Therapy Treatment Patient Details Name: Derek Dunn MRN: [id number] DOB: [birth year] Today's Date: 03/25/2022   History of Present Illness Pt is a 7 male that presented to ED from [facility] jail 7/12 with complaints of worsening back pain and recurrent right groin pain. 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 PMH of polysubstance abuse including IV drug use in the past, housing insecurity. Pt with washout on 7/21 with continue upon transfer orders. PT treatment/assessment performed this date.    PT Comments    Continue upon transfer order received for continued PT this date. Pt is s/p washout in OR yesterday and received in flat supine this morning. Pt premedicated with dilaudid and Oxy prior to session for pain. Rates at 8/'10 pain while at rest. Pt very fearful of movement and refuses to perform mobility. Pt demands pain medication be adjusted by MD as current regimen not working. Pt only agreeable to leg exercises with education provided on frequency and duration. Needs to have OOB performed when pain improved. Pt agreeable to plan.  Recommendations for follow up therapy are one component of a multi-disciplinary discharge planning process, led by the attending physician.  Recommendations may be updated based on patient status, additional functional criteria and insurance authorization.  Follow Up Recommendations  Skilled nursing-short term rehab (<3 hours/day) Can patient physically be transported by private vehicle: Yes   Assistance Recommended at Discharge Intermittent Supervision/Assistance  Patient can return home with the following A little help with walking and/or transfers;A little help with bathing/dressing/bathroom;Assistance with cooking/housework;Assist for transportation;Help with stairs or ramp for  entrance   Equipment Recommendations  Rolling walker (2 wheels)    Recommendations for Other Services       Precautions / Restrictions Precautions Precautions: Fall Restrictions Weight Bearing Restrictions: No     Mobility  Bed Mobility               General bed mobility comments: refused any type of mobility. Educated about benefits and expectations with continued refusal    Transfers                        Ambulation/Gait                   Stairs             Wheelchair Mobility    Modified Rankin (Stroke Patients Only)       Balance                                            Cognition Arousal/Alertness: Awake/alert Behavior During Therapy: WFL for tasks assessed/performed Overall Cognitive Status: Within Functional Limits for tasks assessed                                 General Comments: very firm with decision to not perform OOB        Exercises Other Exercises Other Exercises: supine ther-ex performed x 10 reps including AP, QS, heel slides, hip abd/add, and SAQ. Wrote exercises on board with education to continue performance. Supervision given and pt describes as very effortful. Also educated on breathing exercises, however IS or flutter valve  not in room    General Comments        Pertinent Vitals/Pain Pain Assessment Pain Assessment: 0-10 Pain Score: 8  Pain Location: back Pain Descriptors / Indicators: Burning, Aching, Sore Pain Intervention(s): Limited activity within patient's tolerance, Premedicated before session    Home Living                          Prior Function            PT Goals (current goals can now be found in the care plan section) Acute Rehab PT Goals Patient Stated Goal: to have less pain PT Goal Formulation: With patient Time For Goal Achievement: 04/05/22 Potential to Achieve Goals: Good Progress towards PT goals: Progressing toward  goals    Frequency    Min 2X/week      PT Plan Current plan remains appropriate    Co-evaluation              AM-PAC PT "6 Clicks" Mobility   Outcome Measure  Help needed turning from your back to your side while in a flat bed without using bedrails?: A Little Help needed moving from lying on your back to sitting on the side of a flat bed without using bedrails?: A Little Help needed moving to and from a bed to a chair (including a wheelchair)?: A Lot Help needed standing up from a chair using your arms (e.g., wheelchair or bedside chair)?: A Lot Help needed to walk in hospital room?: A Lot Help needed climbing 3-5 steps with a railing? : Total 6 Click Score: 13    End of Session   Activity Tolerance: Patient limited by pain Patient left: in bed;with call bell/phone within reach;with bed alarm set Nurse Communication: Mobility status PT Visit Diagnosis: Other abnormalities of gait and mobility (R26.89);Difficulty in walking, not elsewhere classified (R26.2);Muscle weakness (generalized) (M62.81);Pain Pain - Right/Left:  (midline) Pain - part of body:  (thoracic back)     Time: 4270-6237 PT Time Calculation (min) (ACUTE ONLY): 17 min  Charges:  $Therapeutic Exercise: 8-22 mins                     Elizabeth Palau, PT, DPT, GCS [phone]    Derek Dunn 03/25/2022, 9:51 AM

## 2022-03-25 NOTE — Progress Notes (Signed)
Pt still has foley catheter in place and this RN educated pt that the foley catheter has to be removed now since foley indication is for peri op and only to stay <24 hrs. Pt refused to have foley cath removed by RN at this moment and wants it removed tomorrow morning instead. Explained the risks of keeping foley in place longer than necessary and pt verbalized understanding but adamant on delaying removal until the morning. Pt alert and oriented. Foley care done for infection prevention. Will remove foley as soon as possible tomorrow morning. Will continue to monitor pt.

## 2022-03-25 NOTE — Progress Notes (Signed)
    Attending Progress Note  History: Derek Dunn is here for thoracic discitis and stenosis.  03/25/2022 POD1: Having expected pain after surgery.  03/22/2022 Derek Dunn continues to have severe thoracic pain.  He has continued issues with weight bearing.  03/16/2022 Derek Dunn is here with a chief complaint of thoracic back pain. He was previously admitted with osteomyelitis in May but left AMA. He has MSSA at the time.  He presented this time with continued and worsening pain.  He denies fevers.   He denies any neurologic symptoms other than pain.    Physical Exam: Vitals:   03/25/22 0504 03/25/22 0806  BP: 100/73 105/73  Pulse: 98 91  Resp: 18 18  Temp: 98.6 F (37 C) 99.1 F (37.3 C)  SpO2: 94% 92%    AA Ox3 CNI  Strength:5/5 throughout Ble SILT Drains - will monitor  Data:  Recent Labs  Lab 03/21/22 0502 03/22/22 0453 03/24/22 0537 03/25/22 0158  NA 139  --  140 136  K 4.9  --  5.0 5.1  CL 107  --  108 106  CO2 26  --  28 23  BUN 20  --  21* 22*  CREATININE 0.86   < > 1.09 1.00  GLUCOSE 120*  --  100* 117*  CALCIUM 8.9  --  9.0 8.3*   < > = values in this interval not displayed.   Recent Labs  Lab 03/22/22 0453  AST 53*  ALT 58*  ALKPHOS 77     Recent Labs  Lab 03/21/22 0502 03/24/22 0537 03/25/22 0158  WBC 4.2 3.7* 5.2  HGB 10.2* 9.9* 9.9*  HCT 34.3* 33.4* 32.4*  PLT 263 239 258   No results for input(s): "APTT", "INR" in the last 168 hours.       Other tests/results:  T spine xrays 03/21/22 - destruction of T8-9 disc space has caused enhanced thoracic kyphosis at t8/9  Assessment/Plan:  Derek Dunn is here for thoracic discitis with destruction at T8-9 and instability related to his discitis.  He is post-op from T7-12 posterior instrumentation with decompression and washout of epidural abscess at T8-9.  - mobilize - pain control - DVT prophylaxis ok to restart - PTOT - Continue drain - Watch CBC for  possibility of anemia from blood loss   Venetia Night MD, [facility] Department of Neurosurgery

## 2022-03-25 NOTE — Progress Notes (Signed)
   03/24/22 2051  Assess: MEWS Score  Temp 97.6 F (36.4 C)  BP 100/70  MAP (mmHg) 81  Pulse Rate 87  Resp (!) 24  SpO2 95 %  O2 Device Room Air  Assess: MEWS Score  MEWS Temp 0  MEWS Systolic 1  MEWS Pulse 0  MEWS RR 1  MEWS LOC 0  MEWS Score 2  MEWS Score Color Yellow  Assess: if the MEWS score is Yellow or Red  Were vital signs taken at a resting state? No  Focused Assessment No change from prior assessment  Does the patient meet 2 or more of the SIRS criteria? No  MEWS guidelines implemented *See Row Information* No, vital signs rechecked  Assess: SIRS CRITERIA  SIRS Temperature  0  SIRS Pulse 0  SIRS Respirations  1  SIRS WBC 1  SIRS Score Sum  2

## 2022-03-25 NOTE — Progress Notes (Signed)
Progress Note    Maycen Degregory   ZHY:[id number]  DOB: 1970-11-20  DOA: 03/15/2022     10 PCP: Pcp, No  Initial CC: back pain  Hospital Course: Mr. Montagna is a 51 year old male with PMH polysubstance abuse including IV drug use in the past, housing insecurity (incarcerated past 2 months, just released (as of 7/12), hospital admission in May 2023 with MSSA bacteremia secondary to spinal osteomyelitis and epidural abscess, recurrent right inguinal hernia, history of signing out AMA.  He presented to the ED from [facility] on 03/15/2022 with complaints of worsening back pain and recurrent right groin pain.  No reported fevers.  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  Labs were mostly unremarkable including no leukocytosis and normal lactic acid.  Patient was started on broad-spectrum IV antibiotic coverage and admitted for further evaluation and management.  Interventional radiology was consulted for abscess aspiration for cultures, performed on 7/12. Infectious disease was also consulted.   Neurosurgery consulted for consideration of washout in the OR, given poor likelihood of full resolution with antibiotics alone. He was taken to the OR for washout on 03/24/22 along with T7-12 posterior instrumentation.   Interval History:  Tolerated surgery well yesterday.  Having expected postop pain this morning when seen.  Laying flat in bed and complaining of ongoing back pain.  Toradol added per neurosurgery this morning. Encouraged him to continue to try to get out of bed to the chair with meals as we continue to progress him slowly with therapy postop.  His aunt was present bedside this morning and also updated.  Assessment and Plan: * Epidural abscess Acute osteomyelitis discitis and facet joint septic arthritis at T8-9 with epidural abscess. Severe  spinal stenosis at T8-T9. Acute septic arthritis at the left L5-S1 facet. History of IV drug use. Prior history MSSA bacteremia. --Initially on IV Cefepime, Vancomycin -- IR consulted, performed CT-guided aspiration for culture on 7/12 --Infectious disease consulted, appreciate antibiotic recommendations.   --On nafcillin continuous IV infusion -Appreciate neurosurgery assistance during hospitalization.  Patient underwent T7-12 posterior instrumentation with decompression and washout of epidural abscess at T8-9 (performed on 03/24/22)  Septic arthritis (HCC) Signs of septic arthritis at level of L5-S1 seen on MRI lumbar spine.  Management as outlined, see epidural abscess.  Discitis thoracic region See epidural abscess for plan  Back pain Secondary to spinal infection and now post op pain as expected - continue pain regimen and abx   Vertebral osteomyelitis (HCC) Management as outlined  Right inguinal hernia S/p reduction in the ED.   --Pain control as needed --Monitor closely for signs of bowel obstruction or incarceration --Surgical consulted if recurrent - continue laxative regimen  Insomnia Trazodone at bedtime ordered.  Hypotension BP as low as 87/61 on AM of 7/13, suspect due to pain medications.  Otherwise no signs of sepsis. --Maintain MAP > 65 with fluids if needed --Monitor closely   Old records reviewed in assessment of this patient  Antimicrobials: Cefepime 7/12 >> 7/12 Vanc 7/12 >> 7/12 Nafcillin 7/12 >> current   DVT prophylaxis:  enoxaparin (LOVENOX) injection 40 mg Start: 03/25/22 1230Lovenox   Code Status:   Code Status: Full Code  Mobility Assessment (last 72 hours)     Mobility Assessment     Row Name 03/25/22 1300 03/25/22 0924 03/24/22 2106 03/24/22 0910 03/23/22 1935   Does patient have an order for  bedrest or is patient medically unstable No - Continue assessment -- No - Continue assessment No - Continue assessment No - Continue  assessment   What is the highest level of mobility based on the progressive mobility assessment? --  refused mobility Level 1 (Bedfast) - Unable to balance while sitting on edge of bed Level 2 (Chairfast) - Balance while sitting on edge of bed and cannot stand Level 6 (Walks independently in room and hall) - Balance while walking in room without assist - Complete Level 6 (Walks independently in room and hall) - Balance while walking in room without assist - Complete   Is the above level different from baseline mobility prior to current illness? Yes - Recommend PT order -- Yes - Recommend PT order -- --    Row Name 03/22/22 2035           Does patient have an order for bedrest or is patient medically unstable No - Continue assessment       What is the highest level of mobility based on the progressive mobility assessment? Level 5 (Walks with assist in room/hall) - Balance while stepping forward/back and can walk in room with assist - Complete                Disposition Plan:  Remaining in hospital for duration of IV abx Status is: Inpt  Objective: Blood pressure 105/73, pulse 91, temperature 99.1 F (37.3 C), resp. rate 18, height 6\' 4"  (1.93 m), weight 88.5 kg, SpO2 92 %.  Examination:  Physical Exam Constitutional:      General: He is not in acute distress.    Appearance: Normal appearance.  HENT:     Head: Normocephalic and atraumatic.     Mouth/Throat:     Mouth: Mucous membranes are moist.  Eyes:     Extraocular Movements: Extraocular movements intact.  Cardiovascular:     Rate and Rhythm: Normal rate and regular rhythm.     Heart sounds: Normal heart sounds.  Pulmonary:     Effort: Pulmonary effort is normal. No respiratory distress.     Breath sounds: Normal breath sounds. No wheezing.  Abdominal:     General: Bowel sounds are normal. There is no distension.     Palpations: Abdomen is soft.     Tenderness: There is no abdominal tenderness.  Musculoskeletal:         General: No swelling.     Cervical back: Normal range of motion and neck supple.     Comments: 2 drains noted coming from back  Skin:    General: Skin is warm and dry.  Neurological:     General: No focal deficit present.     Mental Status: He is alert.  Psychiatric:        Mood and Affect: Mood normal.        Behavior: Behavior normal.      Consultants:  ID Neurosurgery  Procedures:    Data Reviewed: Results for orders placed or performed during the hospital encounter of 03/15/22 (from the past 24 hour(s))  Troponin I (High Sensitivity)     Status: None   Collection Time: 03/24/22 10:10 PM  Result Value Ref Range   Troponin I (High Sensitivity) 8 <18 ng/L  Basic metabolic panel     Status: Abnormal   Collection Time: 03/25/22  1:58 AM  Result Value Ref Range   Sodium 136 135 - 145 mmol/L   Potassium 5.1 3.5 - 5.1 mmol/L   Chloride  106 98 - 111 mmol/L   CO2 23 22 - 32 mmol/L   Glucose, Bld 117 (H) 70 - 99 mg/dL   BUN 22 (H) 6 - 20 mg/dL   Creatinine, Ser 6.60 0.61 - 1.24 mg/dL   Calcium 8.3 (L) 8.9 - 10.3 mg/dL   GFR, Estimated >63 >01 mL/min   Anion gap 7 5 - 15  CBC with Differential/Platelet     Status: Abnormal   Collection Time: 03/25/22  1:58 AM  Result Value Ref Range   WBC 5.2 4.0 - 10.5 K/uL   RBC 3.98 (L) 4.22 - 5.81 MIL/uL   Hemoglobin 9.9 (L) 13.0 - 17.0 g/dL   HCT 60.1 (L) 09.3 - 23.5 %   MCV 81.4 80.0 - 100.0 fL   MCH 24.9 (L) 26.0 - 34.0 pg   MCHC 30.6 30.0 - 36.0 g/dL   RDW 57.3 (H) 22.0 - 25.4 %   Platelets 258 150 - 400 K/uL   nRBC 0.0 0.0 - 0.2 %   Neutrophils Relative % 72 %   Neutro Abs 3.8 1.7 - 7.7 K/uL   Lymphocytes Relative 17 %   Lymphs Abs 0.9 0.7 - 4.0 K/uL   Monocytes Relative 9 %   Monocytes Absolute 0.5 0.1 - 1.0 K/uL   Eosinophils Relative 1 %   Eosinophils Absolute 0.0 0.0 - 0.5 K/uL   Basophils Relative 1 %   Basophils Absolute 0.0 0.0 - 0.1 K/uL   Immature Granulocytes 0 %   Abs Immature Granulocytes 0.02 0.00 - 0.07  K/uL  Magnesium     Status: None   Collection Time: 03/25/22  1:58 AM  Result Value Ref Range   Magnesium 2.0 1.7 - 2.4 mg/dL  Troponin I (High Sensitivity)     Status: None   Collection Time: 03/25/22  1:58 AM  Result Value Ref Range   Troponin I (High Sensitivity) 7 <18 ng/L     I have Reviewed nursing notes, Vitals, and Lab results since pt's last encounter. Pertinent lab results : see above I have ordered test including BMP, CBC, Mg I have reviewed the last note from staff over past 24 hours I have discussed pt's care plan and test results with nursing staff, case manager   LOS: 10 days   Lewie Chamber, MD [facility] 03/25/2022, 3:34 PM

## 2022-03-26 DIAGNOSIS — G062 Extradural and subdural abscess, unspecified: Secondary | ICD-10-CM | POA: Diagnosis not present

## 2022-03-26 LAB — CBC WITH DIFFERENTIAL/PLATELET
Abs Immature Granulocytes: 0.02 10*3/uL (ref 0.00–0.07)
Basophils Absolute: 0 10*3/uL (ref 0.0–0.1)
Basophils Relative: 0 %
Eosinophils Absolute: 0 10*3/uL (ref 0.0–0.5)
Eosinophils Relative: 1 %
HCT: 30.8 % — ABNORMAL LOW (ref 39.0–52.0)
Hemoglobin: 9.2 g/dL — ABNORMAL LOW (ref 13.0–17.0)
Immature Granulocytes: 0 %
Lymphocytes Relative: 18 %
Lymphs Abs: 0.9 10*3/uL (ref 0.7–4.0)
MCH: 25 pg — ABNORMAL LOW (ref 26.0–34.0)
MCHC: 29.9 g/dL — ABNORMAL LOW (ref 30.0–36.0)
MCV: 83.7 fL (ref 80.0–100.0)
Monocytes Absolute: 0.6 10*3/uL (ref 0.1–1.0)
Monocytes Relative: 11 %
Neutro Abs: 3.4 10*3/uL (ref 1.7–7.7)
Neutrophils Relative %: 70 %
Platelets: 235 10*3/uL (ref 150–400)
RBC: 3.68 MIL/uL — ABNORMAL LOW (ref 4.22–5.81)
RDW: 17.9 % — ABNORMAL HIGH (ref 11.5–15.5)
WBC: 5 10*3/uL (ref 4.0–10.5)
nRBC: 0 % (ref 0.0–0.2)

## 2022-03-26 LAB — BASIC METABOLIC PANEL
Anion gap: 6 (ref 5–15)
BUN: 28 mg/dL — ABNORMAL HIGH (ref 6–20)
CO2: 26 mmol/L (ref 22–32)
Calcium: 8.2 mg/dL — ABNORMAL LOW (ref 8.9–10.3)
Chloride: 102 mmol/L (ref 98–111)
Creatinine, Ser: 1.16 mg/dL (ref 0.61–1.24)
GFR, Estimated: >60 mL/min (ref >60)
Glucose, Bld: 141 mg/dL — ABNORMAL HIGH (ref 70–99)
Potassium: 4.7 mmol/L (ref 3.5–5.1)
Sodium: 134 mmol/L — ABNORMAL LOW (ref 135–145)

## 2022-03-26 LAB — MAGNESIUM: Magnesium: 2.3 mg/dL (ref 1.7–2.4)

## 2022-03-26 NOTE — Progress Notes (Signed)
Progress Note    Derek Dunn   FYB:[id number]  DOB: [birth year]  DOA: 03/15/2022     11 PCP: Pcp, No  Initial CC: back pain  Hospital Course: Derek Dunn is a 51 year old male with PMH polysubstance abuse including IV drug use in the past, housing insecurity (incarcerated past 2 months, just released (as of 7/12), hospital admission in May 2023 with MSSA bacteremia secondary to spinal osteomyelitis and epidural abscess, recurrent right inguinal hernia, history of signing out AMA.  He presented to the ED from [facility] on 03/15/2022 with complaints of worsening back pain and recurrent right groin pain.  No reported fevers.  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  Labs were mostly unremarkable including no leukocytosis and normal lactic acid.  Patient was started on broad-spectrum IV antibiotic coverage and admitted for further evaluation and management.  Interventional radiology was consulted for abscess aspiration for cultures, performed on 7/12. Infectious disease was also consulted.   Neurosurgery consulted for consideration of washout in the OR, given poor likelihood of full resolution with antibiotics alone. He was taken to the OR for washout on 03/24/22 along with T7-12 posterior instrumentation.   Interval History:  No events overnight.  He was in pain yesterday but appears more comfortable today.  Ongoing encouragement given to continue trying to get out of bed some.  Assessment and Plan: * Epidural abscess Acute osteomyelitis discitis and facet joint septic arthritis at T8-9 with epidural abscess. Severe spinal stenosis at T8-T9. Acute septic arthritis at the left L5-S1 facet. History of IV drug use. Prior history MSSA bacteremia. --Initially on IV Cefepime, Vancomycin -- IR consulted, performed CT-guided aspiration for culture on  7/12 --Infectious disease consulted, appreciate antibiotic recommendations.   --On nafcillin continuous IV infusion -Appreciate neurosurgery assistance during hospitalization.  Patient underwent T7-12 posterior instrumentation with decompression and washout of epidural abscess at T8-9 (performed on 03/24/22) - drains in place and management per neurosurgery as well   Septic arthritis (HCC) Signs of septic arthritis at level of L5-S1 seen on MRI lumbar spine.  Management as outlined, see epidural abscess.  Discitis thoracic region See epidural abscess for plan  Back pain Secondary to spinal infection and now post op pain as expected - continue pain regimen and abx   Vertebral osteomyelitis (HCC) Management as outlined  Right inguinal hernia S/p reduction in the ED.   --Pain control as needed --Monitor closely for signs of bowel obstruction or incarceration --Surgical consult if recurrent - continue laxative regimen  Insomnia Trazodone at bedtime ordered.  Hypotension BP as low as 87/61 on AM of 7/13, suspect due to pain medications.  Otherwise no signs of sepsis. --Maintain MAP > 65 with fluids if needed --Monitor closely   Old records reviewed in assessment of this patient  Antimicrobials: Cefepime 7/12 >> 7/12 Vanc 7/12 >> 7/12 Nafcillin 7/12 >> current   DVT prophylaxis:  enoxaparin (LOVENOX) injection 40 mg Start: 03/25/22 1230Lovenox   Code Status:   Code Status: Full Code  Mobility Assessment (last 72 hours)     Mobility Assessment     Row Name 03/26/22 1100 03/25/22 2123 03/25/22 1300 03/25/22 0924 03/24/22 2106   Does patient have an order for bedrest or is patient medically unstable No - Continue assessment No - Continue assessment No - Continue assessment -- No - Continue assessment   What is the highest level  of mobility based on the progressive mobility assessment? Level 1 (Bedfast) - Unable to balance while sitting on edge of bed Level 1 (Bedfast) -  Unable to balance while sitting on edge of bed  refused monility --  refused mobility Level 1 (Bedfast) - Unable to balance while sitting on edge of bed Level 2 (Chairfast) - Balance while sitting on edge of bed and cannot stand   Is the above level different from baseline mobility prior to current illness? Yes - Recommend PT order Yes - Recommend PT order Yes - Recommend PT order -- Yes - Recommend PT order    Row Name 03/24/22 0910 03/23/22 1935         Does patient have an order for bedrest or is patient medically unstable No - Continue assessment No - Continue assessment      What is the highest level of mobility based on the progressive mobility assessment? Level 6 (Walks independently in room and hall) - Balance while walking in room without assist - Complete Level 6 (Walks independently in room and hall) - Balance while walking in room without assist - Complete               Disposition Plan:  Remaining in hospital for duration of IV abx Status is: Inpt  Objective: Blood pressure 93/67, pulse 87, temperature 98.7 F (37.1 C), resp. rate 16, height 6\' 4"  (1.93 m), weight 88.5 kg, SpO2 97 %.  Examination:  Physical Exam Constitutional:      General: He is not in acute distress.    Appearance: Normal appearance.  HENT:     Head: Normocephalic and atraumatic.     Mouth/Throat:     Mouth: Mucous membranes are moist.  Eyes:     Extraocular Movements: Extraocular movements intact.  Cardiovascular:     Rate and Rhythm: Normal rate and regular rhythm.     Heart sounds: Normal heart sounds.  Pulmonary:     Effort: Pulmonary effort is normal. No respiratory distress.     Breath sounds: Normal breath sounds. No wheezing.  Abdominal:     General: Bowel sounds are normal. There is no distension.     Palpations: Abdomen is soft.     Tenderness: There is no abdominal tenderness.  Musculoskeletal:        General: No swelling.     Cervical back: Normal range of motion and neck  supple.     Comments: 2 drains noted coming from back  Skin:    General: Skin is warm and dry.  Neurological:     General: No focal deficit present.     Mental Status: He is alert.  Psychiatric:        Mood and Affect: Mood normal.        Behavior: Behavior normal.      Consultants:  ID Neurosurgery  Procedures:  T7-12 posterior instrumentation with decompression and washout of epidural abscess at T8-9: 03/24/22  Data Reviewed: Results for orders placed or performed during the hospital encounter of 03/15/22 (from the past 24 hour(s))  Basic metabolic panel     Status: Abnormal   Collection Time: 03/26/22  4:57 AM  Result Value Ref Range   Sodium 134 (L) 135 - 145 mmol/L   Potassium 4.7 3.5 - 5.1 mmol/L   Chloride 102 98 - 111 mmol/L   CO2 26 22 - 32 mmol/L   Glucose, Bld 141 (H) 70 - 99 mg/dL   BUN 28 (H) 6 - 20  mg/dL   Creatinine, Ser 6.62 0.61 - 1.24 mg/dL   Calcium 8.2 (L) 8.9 - 10.3 mg/dL   GFR, Estimated >94 >76 mL/min   Anion gap 6 5 - 15  CBC with Differential/Platelet     Status: Abnormal   Collection Time: 03/26/22  4:57 AM  Result Value Ref Range   WBC 5.0 4.0 - 10.5 K/uL   RBC 3.68 (L) 4.22 - 5.81 MIL/uL   Hemoglobin 9.2 (L) 13.0 - 17.0 g/dL   HCT 54.6 (L) 50.3 - 54.6 %   MCV 83.7 80.0 - 100.0 fL   MCH 25.0 (L) 26.0 - 34.0 pg   MCHC 29.9 (L) 30.0 - 36.0 g/dL   RDW 56.8 (H) 12.7 - 51.7 %   Platelets 235 150 - 400 K/uL   nRBC 0.0 0.0 - 0.2 %   Neutrophils Relative % 70 %   Neutro Abs 3.4 1.7 - 7.7 K/uL   Lymphocytes Relative 18 %   Lymphs Abs 0.9 0.7 - 4.0 K/uL   Monocytes Relative 11 %   Monocytes Absolute 0.6 0.1 - 1.0 K/uL   Eosinophils Relative 1 %   Eosinophils Absolute 0.0 0.0 - 0.5 K/uL   Basophils Relative 0 %   Basophils Absolute 0.0 0.0 - 0.1 K/uL   Immature Granulocytes 0 %   Abs Immature Granulocytes 0.02 0.00 - 0.07 K/uL  Magnesium     Status: None   Collection Time: 03/26/22  4:57 AM  Result Value Ref Range   Magnesium 2.3 1.7 - 2.4  mg/dL     I have Reviewed nursing notes, Vitals, and Lab results since pt's last encounter. Pertinent lab results : see above I have ordered test including BMP, CBC, Mg I have reviewed the last note from staff over past 24 hours I have discussed pt's care plan and test results with nursing staff, case manager   LOS: 11 days   Lewie Chamber, MD [facility] 03/26/2022, 1:00 PM

## 2022-03-26 NOTE — Progress Notes (Signed)
    Attending Progress Note  History: Derek Dunn is here for thoracic discitis and stenosis.    POD2: Has made some improvement.  Worked with PT yesterday. POD1: Having expected pain after surgery.  03/22/2022 Mr. Derek Dunn continues to have severe thoracic pain.  He has continued issues with weight bearing.  03/16/2022 Mr. Derek Dunn is here with a chief complaint of thoracic back pain. He was previously admitted with osteomyelitis in May but left AMA. He has MSSA at the time.  He presented this time with continued and worsening pain.  He denies fevers.   He denies any neurologic symptoms other than pain.    Physical Exam: Vitals:   03/26/22 0610 03/26/22 0800  BP: 92/63 93/67  Pulse: 98 87  Resp: 19 16  Temp: 98.6 F (37 C) 98.7 F (37.1 C)  SpO2: 94% 97%    AA Ox3 CNI  Strength:5/5 throughout Ble SILT Drains - 95  Data:  Recent Labs  Lab 03/24/22 0537 03/25/22 0158 03/26/22 0457  NA 140 136 134*  K 5.0 5.1 4.7  CL 108 106 102  CO2 28 23 26   BUN 21* 22* 28*  CREATININE 1.09 1.00 1.16  GLUCOSE 100* 117* 141*  CALCIUM 9.0 8.3* 8.2*   Recent Labs  Lab 03/22/22 0453  AST 53*  ALT 58*  ALKPHOS 77     Recent Labs  Lab 03/24/22 0537 03/25/22 0158 03/26/22 0457  WBC 3.7* 5.2 5.0  HGB 9.9* 9.9* 9.2*  HCT 33.4* 32.4* 30.8*  PLT 239 258 235   No results for input(s): "APTT", "INR" in the last 168 hours.       Other tests/results:  T spine xrays 03/21/22 - destruction of T8-9 disc space has caused enhanced thoracic kyphosis at t8/9  Assessment/Plan:  03/23/22 is here for thoracic discitis with destruction at T8-9 and instability related to his discitis.  He is post-op from T7-12 posterior instrumentation with decompression and washout of epidural abscess at T8-9.  - mobilize - pain control - DVT prophylaxis ok - PTOT - Continue drain - Will continue to follow   [name] MD, [facility] Department of Neurosurgery

## 2022-03-27 ENCOUNTER — Encounter: Payer: Self-pay | Admitting: Neurosurgery

## 2022-03-27 DIAGNOSIS — M4624 Osteomyelitis of vertebra, thoracic region: Secondary | ICD-10-CM | POA: Diagnosis not present

## 2022-03-27 DIAGNOSIS — G062 Extradural and subdural abscess, unspecified: Secondary | ICD-10-CM | POA: Diagnosis not present

## 2022-03-27 DIAGNOSIS — M4644 Discitis, unspecified, thoracic region: Secondary | ICD-10-CM | POA: Diagnosis not present

## 2022-03-27 LAB — BASIC METABOLIC PANEL
Anion gap: 6 (ref 5–15)
BUN: 23 mg/dL — ABNORMAL HIGH (ref 6–20)
CO2: 27 mmol/L (ref 22–32)
Calcium: 8.1 mg/dL — ABNORMAL LOW (ref 8.9–10.3)
Chloride: 103 mmol/L (ref 98–111)
Creatinine, Ser: 1.05 mg/dL (ref 0.61–1.24)
GFR, Estimated: >60 mL/min (ref >60)
Glucose, Bld: 143 mg/dL — ABNORMAL HIGH (ref 70–99)
Potassium: 4 mmol/L (ref 3.5–5.1)
Sodium: 136 mmol/L (ref 135–145)

## 2022-03-27 LAB — CBC WITH DIFFERENTIAL/PLATELET
Abs Immature Granulocytes: 0.02 10*3/uL (ref 0.00–0.07)
Basophils Absolute: 0 10*3/uL (ref 0.0–0.1)
Basophils Relative: 1 %
Eosinophils Absolute: 0.1 10*3/uL (ref 0.0–0.5)
Eosinophils Relative: 2 %
HCT: 26.6 % — ABNORMAL LOW (ref 39.0–52.0)
Hemoglobin: 8 g/dL — ABNORMAL LOW (ref 13.0–17.0)
Immature Granulocytes: 1 %
Lymphocytes Relative: 16 %
Lymphs Abs: 0.6 10*3/uL — ABNORMAL LOW (ref 0.7–4.0)
MCH: 24.8 pg — ABNORMAL LOW (ref 26.0–34.0)
MCHC: 30.1 g/dL (ref 30.0–36.0)
MCV: 82.4 fL (ref 80.0–100.0)
Monocytes Absolute: 0.4 10*3/uL (ref 0.1–1.0)
Monocytes Relative: 10 %
Neutro Abs: 2.7 10*3/uL (ref 1.7–7.7)
Neutrophils Relative %: 70 %
Platelets: 219 10*3/uL (ref 150–400)
RBC: 3.23 MIL/uL — ABNORMAL LOW (ref 4.22–5.81)
RDW: 18 % — ABNORMAL HIGH (ref 11.5–15.5)
WBC: 3.8 10*3/uL — ABNORMAL LOW (ref 4.0–10.5)
nRBC: 0 % (ref 0.0–0.2)

## 2022-03-27 LAB — HEPATIC FUNCTION PANEL
ALT: 124 U/L — ABNORMAL HIGH (ref 0–44)
AST: 132 U/L — ABNORMAL HIGH (ref 15–41)
Albumin: 2.1 g/dL — ABNORMAL LOW (ref 3.5–5.0)
Alkaline Phosphatase: 78 U/L (ref 38–126)
Bilirubin, Direct: 0.2 mg/dL (ref 0.0–0.2)
Indirect Bilirubin: 1.3 mg/dL — ABNORMAL HIGH (ref 0.3–0.9)
Total Bilirubin: 1.5 mg/dL — ABNORMAL HIGH (ref 0.3–1.2)
Total Protein: 6 g/dL — ABNORMAL LOW (ref 6.5–8.1)

## 2022-03-27 LAB — MAGNESIUM: Magnesium: 2.1 mg/dL (ref 1.7–2.4)

## 2022-03-27 MED ORDER — CEFAZOLIN SODIUM-DEXTROSE 2-4 GM/100ML-% IV SOLN
2.0000 g | Freq: Three times a day (TID) | INTRAVENOUS | Status: DC
  Administered 2022-03-27 – 2022-04-26 (×90): 2 g via INTRAVENOUS
  Filled 2022-03-27 (×94): qty 100

## 2022-03-27 NOTE — Progress Notes (Signed)
ID Pt c/o pain But lying in bed without any distress until I walked in  O/e Awake and alert Patient Vitals for the past 24 hrs:  BP Temp Pulse Resp SpO2  03/27/22 1546 109/60 98.9 F (37.2 C) 94 17 97 %  03/27/22 0849 102/69 99.1 F (37.3 C) 88 17 95 %  03/27/22 0518 91/62 98.7 F (37.1 C) 95 18 92 %  03/26/22 1928 107/60 99.6 F (37.6 C) 98 18 92 %  03/26/22 1648 95/65 99.8 F (37.7 C) 91 20 99 %    Chest b/l air entry [id number]  Abd soft Pt uncomfortable turning to the side foley  Labs    Latest Ref Rng & Units 03/27/2022    5:16 AM 03/26/2022    4:57 AM 03/25/2022    1:58 AM  CBC  WBC 4.0 - 10.5 K/uL 3.8  5.0  5.2   Hemoglobin 13.0 - 17.0 g/dL 8.0  9.2  9.9   Hematocrit 39.0 - 52.0 % 26.6  30.8  32.4   Platelets 150 - 400 K/uL 219  235  258        Latest Ref Rng & Units 03/27/2022    5:16 AM 03/26/2022    4:57 AM 03/25/2022    1:58 AM  CMP  Glucose 70 - 99 mg/dL 500  938  182   BUN 6 - 20 mg/dL 23  28  22    Creatinine 0.61 - 1.24 mg/dL  9.93  7.16   Sodium 135 - 145 mmol/L 136  134  136   Potassium 3.5 - 5.1 mmol/L 4.0  4.7  5.1   Chloride 98 - 111 mmol/L 103  102  106   CO2 22 - 32 mmol/L 27  26  23    Calcium 8.9 - 10.3 mg/dL 8.1  8.2  8.3   Total Protein 6.5 - 8.1 g/dL 6.0     Total Bilirubin 0.3 - 1.2 mg/dL 1.5     Alkaline Phos 38 - 126 U/L 78     AST 15 - 41 U/L 132     ALT 0 - 44 U/L 124        Micro No surgical culture sent  Impression/recommendation MSSA thoracic spine infection Underwent surgery on 03/24/22 Posterior laminectomy T8-9 for evacuation of epidural abscess 2. Posterolateral arthrodesis T7 to T12 3. Posterior segmental instrumentation T7 to T12 On nafcillin As lLFTS are increasing will change to  cefazolin  Foley will be removed Substance use disorder- pt cannot be sent home  with PICC  Discussed the management with care team

## 2022-03-27 NOTE — Progress Notes (Signed)
Physical Therapy Treatment Patient Details Name: Derek Dunn MRN: [id number] DOB: 06/17/1971 Today's Date: 03/27/2022   History of Present Illness Pt is a 67 male that presented to ED from [facility] jail 7/12 with complaints of worsening back pain and recurrent right groin pain. 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 PMH of polysubstance abuse including IV drug use in the past, housing insecurity. Pt with washout on 7/21 with continue upon transfer orders. PT treatment/assessment performed this date.    PT Comments    Pt is making gradual progress towards goals. Pt premedicated prior to arrival, however still very limited by pain, reporting at 8/10 with exertion, unable to tolerate ambulation at this time. Pt declined use of RW, however after transfer, educated on beneficial need and encouraged to attempt RW at next session. Does exhibit B LE weakness with increased assist required for sit<>Stand. Cues given for sequencing. Will continue to progress as able.   Recommendations for follow up therapy are one component of a multi-disciplinary discharge planning process, led by the attending physician.  Recommendations may be updated based on patient status, additional functional criteria and insurance authorization.  Follow Up Recommendations  Skilled nursing-short term rehab (<3 hours/day) Can patient physically be transported by private vehicle: Yes   Assistance Recommended at Discharge Intermittent Supervision/Assistance  Patient can return home with the following A little help with walking and/or transfers;A little help with bathing/dressing/bathroom;Assistance with cooking/housework;Assist for transportation;Help with stairs or ramp for entrance   Equipment Recommendations  Rolling walker (2 wheels)    Recommendations for  Other Services       Precautions / Restrictions Precautions Precautions: Fall Restrictions Weight Bearing Restrictions: No     Mobility  Bed Mobility Overal bed mobility: Needs Assistance Bed Mobility: Supine to Sit     Supine to sit: Min guard     General bed mobility comments: safe technique, however quite impulsive and getting OOB prior to education given on correct technique. Once seated at EOB, able to sit with upright posture    Transfers Overall transfer level: Needs assistance Equipment used: None Transfers: Sit to/from Stand, Bed to chair/wheelchair/BSC Sit to Stand: Min guard   Step pivot transfers: Min assist       General transfer comment: CGA +2 as pt declines to use RW. Min A +2 for stepping over to recliner. Due to increased pain, further ambulation deferred    Ambulation/Gait               General Gait Details: unable to ambulate at this time due to pain.   Stairs             Wheelchair Mobility    Modified Rankin (Stroke Patients Only)       Balance Overall balance assessment: Needs assistance Sitting-balance support: Feet supported Sitting balance-Leahy Scale: Fair     Standing balance support: Single extremity supported Standing balance-Leahy Scale: Fair                              Cognition Arousal/Alertness: Awake/alert Behavior During Therapy: WFL for tasks assessed/performed Overall Cognitive Status: Within Functional Limits for tasks assessed  General Comments: more pleasant this session        Exercises Other Exercises Other Exercises: seated ther-ex including LAQ x 10 on B LEs. HEP reviewed and encouraged to continue to perform throughout day    General Comments        Pertinent Vitals/Pain Pain Assessment Pain Assessment: 0-10 Pain Score: 8  Pain Location: back Pain Descriptors / Indicators: Burning, Aching, Sore Pain Intervention(s):  Limited activity within patient's tolerance, Repositioned    Home Living                          Prior Function            PT Goals (current goals can now be found in the care plan section) Acute Rehab PT Goals Patient Stated Goal: to have less pain PT Goal Formulation: With patient Time For Goal Achievement: 04/05/22 Potential to Achieve Goals: Good Progress towards PT goals: Progressing toward goals    Frequency    Min 2X/week      PT Plan Current plan remains appropriate    Co-evaluation              AM-PAC PT "6 Clicks" Mobility   Outcome Measure  Help needed turning from your back to your side while in a flat bed without using bedrails?: A Little Help needed moving from lying on your back to sitting on the side of a flat bed without using bedrails?: A Little Help needed moving to and from a bed to a chair (including a wheelchair)?: A Little Help needed standing up from a chair using your arms (e.g., wheelchair or bedside chair)?: A Little Help needed to walk in hospital room?: A Lot Help needed climbing 3-5 steps with a railing? : Total 6 Click Score: 15    End of Session   Activity Tolerance: Patient limited by pain Patient left: in chair (secure chat sent to CNA and RN in regards to mobility and getting back to bed) Nurse Communication: Mobility status PT Visit Diagnosis: Other abnormalities of gait and mobility (R26.89);Difficulty in walking, not elsewhere classified (R26.2);Muscle weakness (generalized) (M62.81);Pain Pain - Right/Left:  (midline) Pain - part of body:  (thoracic area)     Time: 5027-7412 PT Time Calculation (min) (ACUTE ONLY): 14 min  Charges:  $Therapeutic Exercise: 8-22 mins                     Elizabeth Palau, PT, DPT, GCS [phone]    Derek Dunn 03/27/2022, 10:16 AM

## 2022-03-27 NOTE — Progress Notes (Signed)
Progress Note    Derek Dunn   RXV:[id number]  DOB: [birth year]  DOA: 03/15/2022     12 PCP: Pcp, No  Initial CC: back pain  Hospital Course: [name] is a 51 year old male with PMH polysubstance abuse including IV drug use in the past, housing insecurity (incarcerated past 2 months, just released (as of 7/12), hospital admission in May 2023 with MSSA bacteremia secondary to spinal osteomyelitis and epidural abscess, recurrent right inguinal hernia, history of signing out AMA.  He presented to the ED from [facility] on 03/15/2022 with complaints of worsening back pain and recurrent right groin pain.  No reported fevers.  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  Labs were mostly unremarkable including no leukocytosis and normal lactic acid.  Patient was started on broad-spectrum IV antibiotic coverage and admitted for further evaluation and management.  Interventional radiology was consulted for abscess aspiration for cultures, performed on 7/12. Infectious disease was also consulted.   Neurosurgery consulted for consideration of washout in the OR, given poor likelihood of full resolution with antibiotics alone. He was taken to the OR for washout on 03/24/22 along with T7-12 posterior instrumentation.   Interval History:  No events overnight.  Patient having more pain this morning.  Listening provided and tried reassuring him about postop pain and proper pain management recommendations.  He calmed down some and will continue current regimen in place.  Assessment and Plan: * Epidural abscess Acute osteomyelitis discitis and facet joint septic arthritis at T8-9 with epidural abscess. Severe spinal stenosis at T8-T9. Acute septic arthritis at the left L5-S1 facet. History of IV drug use. Prior history MSSA bacteremia. --Initially on IV  Cefepime, Vancomycin -- IR consulted, performed CT-guided aspiration for culture on 7/12 --Infectious disease consulted, appreciate antibiotic recommendations.   --On nafcillin continuous IV infusion -Appreciate neurosurgery assistance during hospitalization.  Patient underwent T7-12 posterior instrumentation with decompression and washout of epidural abscess at T8-9 (performed on 03/24/22) - drain removed on 03/27/2022 per neurosurgery.  VAC remains in place  Septic arthritis (HCC) Signs of septic arthritis at level of L5-S1 seen on MRI lumbar spine.  Management as outlined, see epidural abscess.  Discitis thoracic region See epidural abscess for plan  Back pain Secondary to spinal infection and now post op pain as expected - continue pain regimen and abx   Vertebral osteomyelitis (HCC) Management as outlined  Right inguinal hernia S/p reduction in the ED.   --Pain control as needed --Monitor closely for signs of bowel obstruction or incarceration --Surgical consult if recurrent - continue laxative regimen  Insomnia Trazodone at bedtime ordered.  Hypotension BP as low as 87/61 on AM of 7/13, suspect due to pain medications.  Otherwise no signs of sepsis. --Maintain MAP > 65 with fluids if needed --Monitor closely   Old records reviewed in assessment of this patient  Antimicrobials: Cefepime 7/12 >> 7/12 Vanc 7/12 >> 7/12 Nafcillin 7/12 >> current   DVT prophylaxis:  enoxaparin (LOVENOX) injection 40 mg Start: 03/25/22 1230Lovenox   Code Status:   Code Status: Full Code  Mobility Assessment (last 72 hours)     Mobility Assessment     Row Name 03/27/22 1008 03/27/22 0800 03/26/22 2050 03/26/22 1100 03/25/22 2123   Does patient have an order for bedrest or is patient medically unstable -- No - Continue assessment No - Continue assessment No - Continue assessment  No - Continue assessment   What is the highest level of mobility based on the progressive mobility  assessment? Level 3 (Stands with assist) - Balance while standing  and cannot march in place Level 1 (Bedfast) - Unable to balance while sitting on edge of bed Level 5 (Walks with assist in room/hall) - Balance while stepping forward/back and can walk in room with assist - Complete  refused monility Level 1 (Bedfast) - Unable to balance while sitting on edge of bed Level 1 (Bedfast) - Unable to balance while sitting on edge of bed  refused monility   Is the above level different from baseline mobility prior to current illness? -- Yes - Recommend PT order Yes - Recommend PT order Yes - Recommend PT order Yes - Recommend PT order    Row Name 03/25/22 1300 03/25/22 0924 03/24/22 2106       Does patient have an order for bedrest or is patient medically unstable No - Continue assessment -- No - Continue assessment     What is the highest level of mobility based on the progressive mobility assessment? --  refused mobility Level 1 (Bedfast) - Unable to balance while sitting on edge of bed Level 2 (Chairfast) - Balance while sitting on edge of bed and cannot stand     Is the above level different from baseline mobility prior to current illness? Yes - Recommend PT order -- Yes - Recommend PT order              Disposition Plan:  Remaining in hospital for duration of IV abx Status is: Inpt  Objective: Blood pressure 102/69, pulse 88, temperature 99.1 F (37.3 C), resp. rate 17, height 6\' 4"  (1.93 m), weight 88.5 kg, SpO2 95 %.  Examination:  Physical Exam Constitutional:      General: He is not in acute distress.    Appearance: Normal appearance.  HENT:     Head: Normocephalic and atraumatic.     Mouth/Throat:     Mouth: Mucous membranes are moist.  Eyes:     Extraocular Movements: Extraocular movements intact.  Cardiovascular:     Rate and Rhythm: Normal rate and regular rhythm.     Heart sounds: Normal heart sounds.  Pulmonary:     Effort: Pulmonary effort is normal. No respiratory  distress.     Breath sounds: Normal breath sounds. No wheezing.  Abdominal:     General: Bowel sounds are normal. There is no distension.     Palpations: Abdomen is soft.     Tenderness: There is no abdominal tenderness.  Musculoskeletal:        General: No swelling.     Cervical back: Normal range of motion and neck supple.     Comments: Drain is removed from back.  VAC still remains  Skin:    General: Skin is warm and dry.  Neurological:     General: No focal deficit present.     Mental Status: He is alert.  Psychiatric:        Mood and Affect: Mood normal.        Behavior: Behavior normal.      Consultants:  ID Neurosurgery  Procedures:  T7-12 posterior instrumentation with decompression and washout of epidural abscess at T8-9: 03/24/22  Data Reviewed: Results for orders placed or performed during the hospital encounter of 03/15/22 (from the past 24 hour(s))  Basic metabolic panel     Status: Abnormal   Collection Time: 03/27/22  5:16 AM  Result Value Ref Range   Sodium 136 135 - 145 mmol/L   Potassium 4.0 3.5 - 5.1 mmol/L   Chloride 103 98 - 111 mmol/L   CO2 27 22 - 32 mmol/L   Glucose, Bld 143 (H) 70 - 99 mg/dL   BUN 23 (H) 6 - 20 mg/dL   Creatinine, Ser 0.35 0.61 - 1.24 mg/dL   Calcium 8.1 (L) 8.9 - 10.3 mg/dL   GFR, Estimated >59 >74 mL/min   Anion gap 6 5 - 15  CBC with Differential/Platelet     Status: Abnormal   Collection Time: 03/27/22  5:16 AM  Result Value Ref Range   WBC 3.8 (L) 4.0 - 10.5 K/uL   RBC 3.23 (L) 4.22 - 5.81 MIL/uL   Hemoglobin 8.0 (L) 13.0 - 17.0 g/dL   HCT 16.3 (L) 84.5 - 36.4 %   MCV 82.4 80.0 - 100.0 fL   MCH 24.8 (L) 26.0 - 34.0 pg   MCHC 30.1 30.0 - 36.0 g/dL   RDW 68.0 (H) 32.1 - 22.4 %   Platelets 219 150 - 400 K/uL   nRBC 0.0 0.0 - 0.2 %   Neutrophils Relative % 70 %   Neutro Abs 2.7 1.7 - 7.7 K/uL   Lymphocytes Relative 16 %   Lymphs Abs 0.6 (L) 0.7 - 4.0 K/uL   Monocytes Relative 10 %   Monocytes Absolute 0.4 0.1 - 1.0  K/uL   Eosinophils Relative 2 %   Eosinophils Absolute 0.1 0.0 - 0.5 K/uL   Basophils Relative 1 %   Basophils Absolute 0.0 0.0 - 0.1 K/uL   Immature Granulocytes 1 %   Abs Immature Granulocytes 0.02 0.00 - 0.07 K/uL  Magnesium     Status: None   Collection Time: 03/27/22  5:16 AM  Result Value Ref Range   Magnesium 2.1 1.7 - 2.4 mg/dL     I have Reviewed nursing notes, Vitals, and Lab results since pt's last encounter. Pertinent lab results : see above I have ordered test including BMP, CBC, Mg I have reviewed the last note from staff over past 24 hours I have discussed pt's care plan and test results with nursing staff, case manager   LOS: 12 days   Lewie Chamber, MD [facility] 03/27/2022, 1:53 PM

## 2022-03-27 NOTE — Progress Notes (Signed)
    Attending Progress Note  History: Derek Dunn is here for thoracic discitis and stenosis.   POD3: Some back pain this morning.  POD2: Has made some improvement.  Worked with PT yesterday. POD1: Having expected pain after surgery.  03/22/2022 Derek Dunn continues to have severe thoracic pain.  He has continued issues with weight bearing.  03/16/2022 Derek Dunn is here with a chief complaint of thoracic back pain. He was previously admitted with osteomyelitis in May but left AMA. He has MSSA at the time.  He presented this time with continued and worsening pain.  He denies fevers.   He denies any neurologic symptoms other than pain.    Physical Exam: Vitals:   03/26/22 1928 03/27/22 0518  BP: 107/60 91/62  Pulse: 98 95  Resp: 18 18  Temp: 99.6 F (37.6 C) 98.7 F (37.1 C)  SpO2: 92% 92%    AA Ox3 CNI  Strength:5/5 throughout BLE Drains - 5 yesterday Incision was wound vac in place  Data:  Recent Labs  Lab 03/25/22 0158 03/26/22 0457 03/27/22 0516  NA 136 134* 136  K 5.1 4.7 4.0  CL 106 102 103  CO2 23 26 27   BUN 22* 28* 23*  CREATININE 1.00 1.16 1.05  GLUCOSE 117* 141* 143*  CALCIUM 8.3* 8.2* 8.1*    Recent Labs  Lab 03/22/22 0453  AST 53*  ALT 58*  ALKPHOS 77      Recent Labs  Lab 03/25/22 0158 03/26/22 0457 03/27/22 0516  WBC 5.2 5.0 3.8*  HGB 9.9* 9.2* 8.0*  HCT 32.4* 30.8* 26.6*  PLT 258 235 219    No results for input(s): "APTT", "INR" in the last 168 hours.       Other tests/results:  T spine xrays 03/21/22 - destruction of T8-9 disc space has caused enhanced thoracic kyphosis at t8/9  Assessment/Plan:  03/23/22 is here for thoracic discitis with destruction at T8-9 and instability related to his discitis.  He is post-op from T7-12 posterior instrumentation with decompression and washout of epidural abscess at T8-9.  - mobilize - pain control - DVT prophylaxis ok - PTOT - removed drain this morning.  Will keep wound vac on place - Will continue to follow  [name] PA-C Department of Neurosurgery

## 2022-03-27 NOTE — Anesthesia Postprocedure Evaluation (Signed)
Anesthesia Post Note  Patient: Derek Dunn  Procedure(s) Performed: T7-12 POSTERIOR THORACIC FUSION, T8-9 LAMINECTOMY FOR WASHOUT OF EPIDURAL ABSCESS (Thoracic)  Patient location during evaluation: PACU Anesthesia Type: Combined General/Spinal Level of consciousness: awake and alert Pain management: pain level controlled Vital Signs Assessment: post-procedure vital signs reviewed and stable Respiratory status: spontaneous breathing, nonlabored ventilation, respiratory function stable and patient connected to nasal cannula oxygen Cardiovascular status: blood pressure returned to baseline and stable Postop Assessment: no apparent nausea or vomiting Anesthetic complications: no   No notable events documented.   Last Vitals:  Vitals:   03/27/22 0849 03/27/22 1546  BP: 102/69 109/60  Pulse: 88 94  Resp: 17 17  Temp: 37.3 C 37.2 C  SpO2: 95% 97%    Last Pain:  Vitals:   03/27/22 1614  TempSrc:   PainSc: 4                  Lenard Simmer

## 2022-03-28 DIAGNOSIS — G062 Extradural and subdural abscess, unspecified: Secondary | ICD-10-CM | POA: Diagnosis not present

## 2022-03-28 DIAGNOSIS — M4620 Osteomyelitis of vertebra, site unspecified: Secondary | ICD-10-CM

## 2022-03-28 DIAGNOSIS — M4644 Discitis, unspecified, thoracic region: Secondary | ICD-10-CM | POA: Diagnosis not present

## 2022-03-28 LAB — MAGNESIUM: Magnesium: 2.5 mg/dL — ABNORMAL HIGH (ref 1.7–2.4)

## 2022-03-28 LAB — CBC WITH DIFFERENTIAL/PLATELET
Abs Immature Granulocytes: 0.01 10*3/uL (ref 0.00–0.07)
Basophils Absolute: 0 10*3/uL (ref 0.0–0.1)
Basophils Relative: 1 %
Eosinophils Absolute: 0.1 10*3/uL (ref 0.0–0.5)
Eosinophils Relative: 4 %
HCT: 26 % — ABNORMAL LOW (ref 39.0–52.0)
Hemoglobin: 7.8 g/dL — ABNORMAL LOW (ref 13.0–17.0)
Immature Granulocytes: 0 %
Lymphocytes Relative: 23 %
Lymphs Abs: 0.8 10*3/uL (ref 0.7–4.0)
MCH: 24.8 pg — ABNORMAL LOW (ref 26.0–34.0)
MCHC: 30 g/dL (ref 30.0–36.0)
MCV: 82.8 fL (ref 80.0–100.0)
Monocytes Absolute: 0.5 10*3/uL (ref 0.1–1.0)
Monocytes Relative: 14 %
Neutro Abs: 2 10*3/uL (ref 1.7–7.7)
Neutrophils Relative %: 58 %
Platelets: 286 10*3/uL (ref 150–400)
RBC: 3.14 MIL/uL — ABNORMAL LOW (ref 4.22–5.81)
RDW: 18.2 % — ABNORMAL HIGH (ref 11.5–15.5)
WBC: 3.3 10*3/uL — ABNORMAL LOW (ref 4.0–10.5)
nRBC: 0 % (ref 0.0–0.2)

## 2022-03-28 LAB — BASIC METABOLIC PANEL
Anion gap: 7 (ref 5–15)
BUN: 24 mg/dL — ABNORMAL HIGH (ref 6–20)
CO2: 27 mmol/L (ref 22–32)
Calcium: 8.5 mg/dL — ABNORMAL LOW (ref 8.9–10.3)
Chloride: 106 mmol/L (ref 98–111)
Creatinine, Ser: 0.97 mg/dL (ref 0.61–1.24)
GFR, Estimated: >60 mL/min (ref >60)
Glucose, Bld: 114 mg/dL — ABNORMAL HIGH (ref 70–99)
Potassium: 4.5 mmol/L (ref 3.5–5.1)
Sodium: 140 mmol/L (ref 135–145)

## 2022-03-28 NOTE — Progress Notes (Signed)
Patient in recliner coloring; wound vac dressing in place, no complaints at this time.  Patient has dry skin to left palm/thumb he states has been there for a while.   New blisters to back noted by Thayer Ohm, RN.

## 2022-03-28 NOTE — Progress Notes (Signed)
ID Pt c/o burning back He says he was on heating pad for 3 days before surgery O/e awake and alert BP 95/72 (BP Location: Right Arm)   Pulse 99   Temp 98.7 F (37.1 C)   Resp 18   Ht 6\' 4"  (1.93 m)   Wt 88.5 kg   SpO2 (!) 75%   BMI 23.75 kg/m   Chest b/l air entry [id number] Abd soft Back erythematous With desquamted blister   Scaling over left hand      Labs    Latest Ref Rng & Units 03/28/2022    4:44 AM 03/27/2022    5:16 AM 03/26/2022    4:57 AM  CBC  WBC 4.0 - 10.5 K/uL 3.3  3.8  5.0   Hemoglobin 13.0 - 17.0 g/dL 7.8  8.0  9.2   Hematocrit 39.0 - 52.0 % 26.0  26.6  30.8   Platelets 150 - 400 K/uL 286  219  235        Latest Ref Rng & Units 03/28/2022    4:44 AM 03/27/2022    5:16 AM 03/26/2022    4:57 AM  CMP  Glucose 70 - 99 mg/dL 03/28/2022  502  774   BUN 6 - 20 mg/dL 24  23  28    Creatinine 0.61 - 1.24 mg/dL 128   7.86   Sodium 135 - 145 mmol/L 140  136  134   Potassium 3.5 - 5.1 mmol/L 4.5  4.0  4.7   Chloride 98 - 111 mmol/L 106  103  102   CO2 22 - 32 mmol/L 27  27  26    Calcium 8.9 - 10.3 mg/dL 8.5  8.1  8.2   Total Protein 6.5 - 8.1 g/dL  6.0    Total Bilirubin 0.3 - 1.2 mg/dL  1.5    Alkaline Phos 38 - 126 U/L  78    AST 15 - 41 U/L  132    ALT 0 - 44 U/L  124       Impression/recommendation MSSA thoracic vertebrae infection s/p fusion Was on nafcillin, changed ot cefazolin yesterday due to increasing transaminases and declining wbc  Thermal injury to the skin on the back- because of heating pad  Left hand eczema Discussed the management with patient and nurse

## 2022-03-28 NOTE — Discharge Instructions (Addendum)
NEUROSURGERY DISCHARGE INSTRUCTIONS  Admission diagnosis: Epidural abscess [G06.2] Unilateral recurrent inguinal hernia without obstruction or gangrene [K40.91]  Operative procedure: T7-12 posterior instrumentation with decompression and washout of epidural abscess at T8-9.  What to do after you leave the hospital:  Recommended diet: regular diet. Increase protein intake to promote wound healing.  Recommended activity: no lifting, driving, or strenuous exercise for 4 weeks .You should walk multiple times per day  Special Instructions  No straining, no heavy lifting > 10lbs x 4 weeks.  Keep incision area clean and dry. May shower in 2 days. No baths or pools for 6 weeks.  Please remove dressing tomorrow, no need to apply a bandage afterwards  Please take pain medications as directed. Take a stool softener if on pain medications   Please Report any of the following: Nausea or Vomiting, Temperature is greater than 101.78F (38.1C) degrees, Dizziness, Abdominal Pain, Difficulty Breathing or Shortness of Breath, Inability to Eat, drink Fluids, or Take medications, Bleeding, swelling, or drainage from surgical incision sites, New numbness or weakness, and Bowel or bladder dysfunction to the neurosurgeon on call at [phone]  Additional Follow up appointments Please follow up with Manning Charity PA-C in [place] [facility] as scheduled in 2-3 weeks   Please see below for scheduled appointments:  Future Appointments  Date Time Provider Department Center  04/07/2022  9:30 AM Susanne Borders, PA [facility] None  05/04/2022  2:45 PM Venetia Night, MD [facility] None  06/15/2022  2:45 PM Venetia Night, MD [facility] None

## 2022-03-28 NOTE — Progress Notes (Signed)
Progress Note   Patient: Derek Dunn [id number]:[id number] DOB: [birth year] DOA: 03/15/2022     13 DOS: the patient was seen and examined on 03/28/2022   Brief hospital course: Derek Dunn is a 51 year old male with PMH polysubstance abuse including IV drug use in the past, housing insecurity (incarcerated past 2 months, just released (as of 7/12), hospital admission in May 2023 with MSSA bacteremia secondary to spinal osteomyelitis and epidural abscess, recurrent right inguinal hernia, history of signing out AMA.  He presented to the ED from [facility] on 03/15/2022 with complaints of worsening back pain and recurrent right groin pain.  No reported fevers.  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  Labs were mostly unremarkable including no leukocytosis and normal lactic acid.  Patient was started on broad-spectrum IV antibiotic coverage and admitted for further evaluation and management.  Interventional radiology was consulted for abscess aspiration for cultures, performed on 7/12. Infectious disease was also consulted.   Neurosurgery consulted for consideration of washout in the OR, given poor likelihood of full resolution with antibiotics alone. He was taken to the OR for washout on 03/24/22.  Underwent posterior laminectomy T8-9 for evacuation of epidural abscess, posterolateral arthrodesis T7 to T12 with T7-12 posterior instrumentation.   Assessment and Plan: * Epidural abscess Acute osteomyelitis discitis and facet joint septic arthritis at T8-9 with epidural abscess. Severe spinal stenosis at T8-T9. Acute septic arthritis at the left L5-S1 facet. History of IV drug use. Prior history MSSA bacteremia. --Initially on IV Cefepime, Vancomycin -- IR consulted, performed CT-guided aspiration for culture on 7/12 --Infectious disease consulted  --Was on  nafcillin continuous IV infusion --Antibiotic changed to cefazolin on 7/24 due to rising LFTs --Neurosurgery consulted.  Patient underwent T7-12 posterior instrumentation with decompression and washout of epidural abscess at T8-9 (performed on 03/24/22) - Drain removed on 03/27/2022 per neurosurgery.  VAC remains in place. --Neurosurgery recommends: Mobilize, pain control, resume DVT prophylaxis, PT OT, maintain wound VAC  Septic arthritis (HCC) Signs of septic arthritis at level of L5-S1 seen on MRI lumbar spine.  Management as outlined, see epidural abscess.  Discitis thoracic region See epidural abscess for plan  Back pain Secondary to spinal infection and now post op pain as expected - continue pain regimen and abx   Vertebral osteomyelitis (HCC) Management as outlined  Right inguinal hernia S/p reduction in the ED.   --Pain control as needed --Monitor closely for signs of bowel obstruction or incarceration --Surgical consult if recurrent - continue laxative regimen  Insomnia Trazodone at bedtime ordered.  Hypotension BP as low as 87/61 on AM of 7/13, suspect due to pain medications.  Otherwise no signs of sepsis. --Maintain MAP > 65 with fluids if needed --Monitor closely        Subjective: Patient awake resting in bed comfortably when seen on rounds this morning.  He reports ongoing uncontrolled pain and that he is not being given pain medicines the way they are ordered which is as needed.  Reports delays in getting doses of pain meds and pain gets uncontrolled between doses.  No other acute complaints or acute events reported.  Physical Exam: Vitals:   03/27/22 1546 03/27/22 2049 03/28/22 0541 03/28/22 0804  BP: 109/60 115/78 94/65 93/63   Pulse: 94 97 77 72  Resp: 17 18 17 18   Temp: 98.9 F (37.2 C) 100.2 F (37.9 C) 98.2 F (36.8  C) 97.9 F (36.6 C)  TempSrc:  Oral Oral   SpO2: 97% 96% 92% 96%  Weight:      Height:       General exam: awake, alert, no  acute distress HEENT: atraumatic, clear conjunctiva, anicteric sclera, moist mucus membranes, hearing grossly normal  Respiratory system: On room air, normal respiratory effort. Cardiovascular system: Regular rate rhythm, no peripheral edema.   Gastrointestinal system: soft, nondistended abdomen Central nervous system: A&O x3. no gross focal neurologic deficits, normal speech Extremities: no edema, normal tone Skin: dry, intact, normal temperature Psychiatry: normal mood, congruent affect   Data Reviewed:  Notable labs: Glucose 114, BUN 24, calcium 8.5, magnesium 2.5, WBC 3.3, hemoglobin 7.8 from 8.0  Family Communication: None   Disposition: Status is: Inpatient Remains inpatient appropriate because: On IV antibiotics and still being followed by neurosurgery, pain uncontrolled.  Unsafe to discharge with PICC line for IV antibiotics, to remain in hospital for course.   Planned Discharge Destination: Home    Time spent: 40 minutes  Author: Pennie Banter, DO 03/28/2022 1:33 PM  For on call review [email or web address].

## 2022-03-29 DIAGNOSIS — G062 Extradural and subdural abscess, unspecified: Secondary | ICD-10-CM | POA: Diagnosis not present

## 2022-03-29 LAB — CBC
HCT: 25.1 % — ABNORMAL LOW (ref 39.0–52.0)
Hemoglobin: 7.6 g/dL — ABNORMAL LOW (ref 13.0–17.0)
MCH: 25.2 pg — ABNORMAL LOW (ref 26.0–34.0)
MCHC: 30.3 g/dL (ref 30.0–36.0)
MCV: 83.1 fL (ref 80.0–100.0)
Platelets: 306 10*3/uL (ref 150–400)
RBC: 3.02 MIL/uL — ABNORMAL LOW (ref 4.22–5.81)
RDW: 18.2 % — ABNORMAL HIGH (ref 11.5–15.5)
WBC: 3 10*3/uL — ABNORMAL LOW (ref 4.0–10.5)
nRBC: 0 % (ref 0.0–0.2)

## 2022-03-29 LAB — COMPREHENSIVE METABOLIC PANEL
ALT: 114 U/L — ABNORMAL HIGH (ref 0–44)
AST: 137 U/L — ABNORMAL HIGH (ref 15–41)
Albumin: 2.2 g/dL — ABNORMAL LOW (ref 3.5–5.0)
Alkaline Phosphatase: 79 U/L (ref 38–126)
Anion gap: 6 (ref 5–15)
BUN: 26 mg/dL — ABNORMAL HIGH (ref 6–20)
CO2: 26 mmol/L (ref 22–32)
Calcium: 8.3 mg/dL — ABNORMAL LOW (ref 8.9–10.3)
Chloride: 107 mmol/L (ref 98–111)
Creatinine, Ser: 1.02 mg/dL (ref 0.61–1.24)
GFR, Estimated: >60 mL/min (ref >60)
Glucose, Bld: 114 mg/dL — ABNORMAL HIGH (ref 70–99)
Potassium: 4.4 mmol/L (ref 3.5–5.1)
Sodium: 139 mmol/L (ref 135–145)
Total Bilirubin: 1 mg/dL (ref 0.3–1.2)
Total Protein: 5.8 g/dL — ABNORMAL LOW (ref 6.5–8.1)

## 2022-03-29 NOTE — Progress Notes (Signed)
    Attending Progress Note  History: Trevonn Hallum is here for thoracic discitis and stenosis.  POD5: NEAO POD3: Some back pain this morning.  POD2: Has made some improvement.  Worked with PT yesterday. POD1: Having expected pain after surgery.  03/22/2022 Mr. Junker continues to have severe thoracic pain.  He has continued issues with weight bearing.  03/16/2022 Mr. Dequante Tremaine is here with a chief complaint of thoracic back pain. He was previously admitted with osteomyelitis in May but left AMA. He has MSSA at the time.  He presented this time with continued and worsening pain.  He denies fevers.   He denies any neurologic symptoms other than pain.    Physical Exam: Vitals:   03/28/22 2007 03/29/22 0449  BP: 95/72 97/64  Pulse: 99 85  Resp: 18 16  Temp: 98.7 F (37.1 C) 98.1 F (36.7 C)  SpO2: (!) 75% 90%    AA Ox3 CNI  Strength:5/5 throughout BLE Incision c/d/i  Data:  Recent Labs  Lab 03/27/22 0516 03/28/22 0444 03/29/22 0451  NA 136 140 139  K 4.0 4.5 4.4  CL 103 106 107  CO2 27 27 26   BUN 23* 24* 26*  CREATININE 1.05 0.97 1.02  GLUCOSE 143* 114* 114*  CALCIUM 8.1* 8.5* 8.3*    Recent Labs  Lab 03/29/22 0451  AST 137*  ALT 114*  ALKPHOS 79      Recent Labs  Lab 03/27/22 0516 03/28/22 0444 03/29/22 0451  WBC 3.8* 3.3* 3.0*  HGB 8.0* 7.8* 7.6*  HCT 26.6* 26.0* 25.1*  PLT 219 286 306    No results for input(s): "APTT", "INR" in the last 168 hours.       Other tests/results:  T spine xrays 03/21/22 - destruction of T8-9 disc space has caused enhanced thoracic kyphosis at t8/9  Assessment/Plan:  03/23/22 is here for thoracic discitis with destruction at T8-9 and instability related to his discitis.  He is post-op from T7-12 posterior instrumentation with decompression and washout of epidural abscess at T8-9.  - mobilize - pain control - DVT prophylaxis ok - PTOT - wound vac removed - will follow up outpatient in 2  weeks. - please call with any questions or concerns.  [name] PA-C Department of Neurosurgery

## 2022-03-29 NOTE — Progress Notes (Signed)
Physical Therapy Treatment Patient Details Name: Derek Dunn MRN: [id number] DOB: August 14, 1971 Today's Date: 03/29/2022   History of Present Illness Pt is a 85 male that presented to ED from [facility] jail 7/12 with complaints of worsening back pain and recurrent right groin pain. 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 PMH of polysubstance abuse including IV drug use in the past, housing insecurity. Pt with washout on 7/21 with continue upon transfer orders. PT treatment/assessment performed this date.    PT Comments    Pt is making excellent progress towards goals with ability to ambulate multiple laps around RN station using RW. Pt reports pain at 6/10, premedicated prior to session. Pt still struggles with homelessness and has no AD for access after discharge. Due to safety, currently recommending SNF, however if secure housing received, can update to HHPT and RW. WIll continue to progress.   Recommendations for follow up therapy are one component of a multi-disciplinary discharge planning process, led by the attending physician.  Recommendations may be updated based on patient status, additional functional criteria and insurance authorization.  Follow Up Recommendations  Skilled nursing-short term rehab (<3 hours/day) Can patient physically be transported by private vehicle: Yes   Assistance Recommended at Discharge PRN  Patient can return home with the following A little help with walking and/or transfers;A little help with bathing/dressing/bathroom;Assistance with cooking/housework;Assist for transportation;Help with stairs or ramp for entrance   Equipment Recommendations  Rolling walker (2 wheels)    Recommendations for Other Services       Precautions / Restrictions Precautions Precautions:  Fall Restrictions Weight Bearing Restrictions: No     Mobility  Bed Mobility Overal bed mobility: Modified Independent Bed Mobility: Supine to Sit     Supine to sit: Modified independent (Device/Increase time)     General bed mobility comments: safe technique with upright posture once seated at EOB. Able to don clothing once seated    Transfers Overall transfer level: Modified independent Equipment used: Rolling walker (2 wheels) Transfers: Sit to/from Stand Sit to Stand: Modified independent (Device/Increase time)           General transfer comment: safe technique with upright posture. RW used    Ambulation/Gait Ambulation/Gait assistance: Chief Operating Officer (Feet): 450 Feet Assistive device: Rolling walker (2 wheels) Gait Pattern/deviations: Step-through pattern       General Gait Details: ambulated around RN station multiple laps with RW. Reciprocal gait pattern and upright posture. Increased SOB symptoms with exertion and fatigue. Educated on frequent mobility.   Stairs             Wheelchair Mobility    Modified Rankin (Stroke Patients Only)       Balance Overall balance assessment: Modified Independent Sitting-balance support: Feet supported Sitting balance-Leahy Scale: Good                                      Cognition Arousal/Alertness: Awake/alert Behavior During Therapy: WFL for tasks assessed/performed Overall Cognitive Status: Within Functional Limits for tasks assessed                                 General Comments: pleasant and agreeable today        Exercises  General Comments        Pertinent Vitals/Pain Pain Assessment Pain Assessment: 0-10 Pain Score: 6  Pain Location: back Pain Descriptors / Indicators: Burning, Aching, Sore Pain Intervention(s): Limited activity within patient's tolerance, Repositioned, Premedicated before session    Home Living                           Prior Function            PT Goals (current goals can now be found in the care plan section) Acute Rehab PT Goals Patient Stated Goal: to have less pain PT Goal Formulation: With patient Time For Goal Achievement: 04/05/22 Potential to Achieve Goals: Good Progress towards PT goals: Progressing toward goals    Frequency    Min 2X/week      PT Plan Current plan remains appropriate    Co-evaluation              AM-PAC PT "6 Clicks" Mobility   Outcome Measure  Help needed turning from your back to your side while in a flat bed without using bedrails?: None Help needed moving from lying on your back to sitting on the side of a flat bed without using bedrails?: None Help needed moving to and from a bed to a chair (including a wheelchair)?: A Little Help needed standing up from a chair using your arms (e.g., wheelchair or bedside chair)?: A Little Help needed to walk in hospital room?: A Little Help needed climbing 3-5 steps with a railing? : A Little 6 Click Score: 20    End of Session   Activity Tolerance: Patient tolerated treatment well Patient left: in chair Nurse Communication: Mobility status PT Visit Diagnosis: Other abnormalities of gait and mobility (R26.89);Difficulty in walking, not elsewhere classified (R26.2);Muscle weakness (generalized) (M62.81);Pain Pain - Right/Left:  (midline) Pain - part of body:  (thoracic area)     Time: 7408-1448 PT Time Calculation (min) (ACUTE ONLY): 16 min  Charges:  $Gait Training: 8-22 mins                     Derek Dunn, PT, DPT, GCS [phone]    Derek Dunn 03/29/2022, 12:15 PM

## 2022-03-29 NOTE — Progress Notes (Signed)
Progress Note   Patient: Derek Dunn ATF:[id number] DOB: 25-Feb-1971 DOA: 03/15/2022     14 DOS: the patient was seen and examined on 03/29/2022   Brief hospital course: Derek Dunn is a 51 year old male with PMH polysubstance abuse including IV drug use in the past, housing insecurity (incarcerated past 2 months, just released (as of 7/12), hospital admission in May 2023 with MSSA bacteremia secondary to spinal osteomyelitis and epidural abscess, recurrent right inguinal hernia, history of signing out AMA.  He presented to the ED from [facility] on 03/15/2022 with complaints of worsening back pain and recurrent right groin pain.  No reported fevers.  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  Labs were mostly unremarkable including no leukocytosis and normal lactic acid.  Patient was started on broad-spectrum IV antibiotic coverage and admitted for further evaluation and management.  Interventional radiology was consulted for abscess aspiration for cultures, performed on 7/12. Infectious disease was also consulted.   Neurosurgery consulted for consideration of washout in the OR, given poor likelihood of full resolution with antibiotics alone. He was taken to the OR for washout on 03/24/22.  Underwent posterior laminectomy T8-9 for evacuation of epidural abscess, posterolateral arthrodesis T7 to T12 with T7-12 posterior instrumentation.   Assessment and Plan: * Epidural abscess Acute osteomyelitis discitis and facet joint septic arthritis at T8-9 with epidural abscess. Severe spinal stenosis at T8-T9. Acute septic arthritis at the left L5-S1 facet. History of IV drug use. Prior history MSSA bacteremia. --Initially on IV Cefepime, Vancomycin -- IR consulted, performed CT-guided aspiration for culture on 7/12 --Infectious disease consulted  --Was on  nafcillin continuous IV infusion --Antibiotic changed to cefazolin on 7/24 due to rising LFTs --Neurosurgery consulted.  Patient underwent T7-12 posterior instrumentation with decompression and washout of epidural abscess at T8-9 (performed on 03/24/22) -- Drain removed on 03/27/2022  -- Wound VAC removed 26 --Neurosurgery recommends: Mobilize, pain control, resume DVT prophylaxis, PT/OT -- Follow-up with neurosurgery in 2 weeks  Septic arthritis (HCC) Signs of septic arthritis at level of L5-S1 seen on MRI lumbar spine.  Management as outlined, see epidural abscess.  Discitis thoracic region See epidural abscess for plan  Back pain Secondary to spinal infection and now post op pain as expected - continue pain regimen and abx   Vertebral osteomyelitis (HCC) Management as outlined  Right inguinal hernia S/p reduction in the ED.   --Pain control as needed --Monitor closely for signs of bowel obstruction or incarceration --Surgical consult if recurrent - continue laxative regimen  Insomnia Trazodone at bedtime ordered.  Hypotension BP as low as 87/61 on AM of 7/13, suspect due to pain medications.  Otherwise no signs of sepsis. --Maintain MAP > 65 with fluids if needed --Monitor closely        Subjective: Patient awake resting in bed comfortably when seen on rounds this morning.  Also seen ambulating the unit with therapy.  He reports being out of bed for almost 9 hours yesterday in the recliner.  Says it felt good to be out of bed.  He is looking forward to walking around more again today.  He continues to have bouts of uncontrolled pain but overall better.  He complains of pain minimally today.  Physical Exam: Vitals:   03/28/22 1650 03/28/22 2007 03/29/22 0449 03/29/22 0804  BP: 92/65 95/72 97/64  93/63  Pulse: 100 99 85 85  Resp:  18 18 16 18   Temp: 98.2 F (36.8 C) 98.7 F (37.1 C) 98.1 F (36.7 C) 98.8 F (37.1 C)  TempSrc:    Oral  SpO2: 100% (!) 75% 90% 96%   Weight:      Height:       General exam: awake, alert, no acute distress HEENT: Moist mucous membranes, hearing grossly normal Respiratory system: CTA B, normal respiratory effort at rest, on room air, no wheezes or rhonchi. Cardiovascular system: Normal S1/S2, RRR, no peripheral edema.   Central nervous system: A&O x3. no gross focal neurologic deficits, normal speech Extremities: no edema, normal tone Skin: dry, intact, normal temperature Psychiatry: normal mood, congruent affect   Data Reviewed:  Notable labs: Glucose 114, BUN 26, calcium 8.3, albumin 2.2, AST 137, ALT 114, total protein 5.8, total bili normalized 1.0 from 1.5, WBC 3.0 from 3.3, hemoglobin 7.6 from 7.8  Family Communication: None   Disposition: Status is: Inpatient Remains inpatient appropriate because: On IV antibiotics and still being followed by neurosurgery, pain uncontrolled.  Unsafe to discharge with PICC line for IV antibiotics due to his history of IVDU, needs to remain in hospital for course.   Planned Discharge Destination: Home    Time spent: 35 minutes  Author: , DO 03/29/2022 1:09 PM  For on call review [email or web address].

## 2022-03-30 DIAGNOSIS — G062 Extradural and subdural abscess, unspecified: Secondary | ICD-10-CM | POA: Diagnosis not present

## 2022-03-30 DIAGNOSIS — M4644 Discitis, unspecified, thoracic region: Secondary | ICD-10-CM | POA: Diagnosis not present

## 2022-03-30 DIAGNOSIS — M4624 Osteomyelitis of vertebra, thoracic region: Secondary | ICD-10-CM | POA: Diagnosis not present

## 2022-03-30 LAB — BPAM RBC
Blood Product Expiration Date: 202307212359
Blood Product Expiration Date: 202308192359
Blood Product Expiration Date: 202308192359
ISSUE DATE / TIME: 202307210959
Unit Type and Rh: 9500
Unit Type and Rh: 9500
Unit Type and Rh: 9500

## 2022-03-30 LAB — TYPE AND SCREEN
ABO/RH(D): O NEG
Antibody Screen: POSITIVE
Unit division: 0
Unit division: 0
Unit division: 0

## 2022-03-30 LAB — CBC
HCT: 25.1 % — ABNORMAL LOW (ref 39.0–52.0)
Hemoglobin: 7.6 g/dL — ABNORMAL LOW (ref 13.0–17.0)
MCH: 25.3 pg — ABNORMAL LOW (ref 26.0–34.0)
MCHC: 30.3 g/dL (ref 30.0–36.0)
MCV: 83.7 fL (ref 80.0–100.0)
Platelets: 332 10*3/uL (ref 150–400)
RBC: 3 MIL/uL — ABNORMAL LOW (ref 4.22–5.81)
RDW: 18.4 % — ABNORMAL HIGH (ref 11.5–15.5)
WBC: 3.7 10*3/uL — ABNORMAL LOW (ref 4.0–10.5)
nRBC: 0 % (ref 0.0–0.2)

## 2022-03-30 LAB — RESP PANEL BY RT-PCR (FLU A&B, COVID) ARPGX2
Influenza A by PCR: NEGATIVE
Influenza B by PCR: NEGATIVE
SARS Coronavirus 2 by RT PCR: NEGATIVE

## 2022-03-30 MED ORDER — ACETAMINOPHEN 325 MG PO TABS
650.0000 mg | ORAL_TABLET | Freq: Four times a day (QID) | ORAL | Status: DC | PRN
  Administered 2022-04-04: 650 mg via ORAL
  Filled 2022-03-30 (×2): qty 2

## 2022-03-30 MED ORDER — CALAMINE EX LOTN
1.0000 | TOPICAL_LOTION | Freq: Two times a day (BID) | CUTANEOUS | Status: DC
Start: 2022-03-30 — End: 2022-04-17
  Administered 2022-03-30 – 2022-04-09 (×14): 1 via TOPICAL
  Filled 2022-03-30: qty 5

## 2022-03-30 NOTE — Progress Notes (Signed)
PT Cancellation Note  Patient Details Name: Derek Dunn MRN: [id number] DOB: [birth year]   Cancelled Treatment:    Reason Eval/Treat Not Completed: Pain limiting ability to participate. Timed PT session around pain meds. Patient declined due to pain and reports he sat up for 4 hours and already walked the hallway today. PT will continue with attempts.  Donna Bernard, PT, MPT  Ina Homes 03/30/2022, 2:32 PM

## 2022-03-30 NOTE — Progress Notes (Signed)
Progress Note   Patient: Derek Dunn [id number]:[id number] DOB: Nov 15, 1970 DOA: 03/15/2022     15 DOS: the patient was seen and examined on 03/30/2022   Brief hospital course: Derek Dunn is a 51 year old male with PMH polysubstance abuse including IV drug use in the past, housing insecurity (incarcerated past 2 months, just released (as of 7/12), hospital admission in May 2023 with MSSA bacteremia secondary to spinal osteomyelitis and epidural abscess, recurrent right inguinal hernia, history of signing out AMA.  He presented to the ED from [facility] on 03/15/2022 with complaints of worsening back pain and recurrent right groin pain.  No reported fevers.  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  Labs were mostly unremarkable including no leukocytosis and normal lactic acid.  Patient was started on broad-spectrum IV antibiotic coverage and admitted for further evaluation and management.  Interventional radiology was consulted for abscess aspiration for cultures, performed on 7/12. Infectious disease was also consulted.   Neurosurgery consulted for consideration of washout in the OR, given poor likelihood of full resolution with antibiotics alone. He was taken to the OR for washout on 03/24/22.  Underwent posterior laminectomy T8-9 for evacuation of epidural abscess, posterolateral arthrodesis T7 to T12 with T7-12 posterior instrumentation.    Consultants: Neurosurgery Infectious Disease    Assessment and Plan: * Epidural abscess Acute osteomyelitis discitis and facet joint septic arthritis at T8-9 with epidural abscess. Severe spinal stenosis at T8-T9. Acute septic arthritis at the left L5-S1 facet. History of IV drug use. Prior history MSSA bacteremia. --Initially on IV Cefepime, Vancomycin -- IR consulted, performed CT-guided aspiration for culture  on 7/12 --Infectious disease consulted  --Was on nafcillin continuous IV infusion --Antibiotic changed to cefazolin on 7/24 due to rising LFTs --Neurosurgery consulted.  Patient underwent T7-12 posterior instrumentation with decompression and washout of epidural abscess at T8-9 (performed on 03/24/22) -- Drain removed on 03/27/2022  -- Wound VAC removed 26 --Neurosurgery recommends: Mobilize, pain control, resume DVT prophylaxis, PT/OT -- Follow-up with neurosurgery in 2 weeks  Septic arthritis (HCC) Signs of septic arthritis at level of L5-S1 seen on MRI lumbar spine.  Management as outlined, see epidural abscess.  Discitis thoracic region See epidural abscess for plan  Back pain Secondary to spinal infection and now post op pain as expected - continue pain regimen and abx   Vertebral osteomyelitis (HCC) Management as outlined  Right inguinal hernia S/p reduction in the ED.   --Pain control as needed --Monitor closely for signs of bowel obstruction or incarceration --Surgical consult if recurrent - continue laxative regimen  Fever Temp 100.4 noted on evening of 7/26. Pt denies symptoms at the time.  Does report some congestion and productive cough. --Check Covid-19, Flu A/B, and respiratory viral panel --Remains on Ancef  --Monitor CBC, fever curve --Monitor clinically for s/sx's of infection  Insomnia Trazodone at bedtime ordered.  Hypotension BP as low as 87/61 on AM of 7/13, suspect due to pain medications.  Otherwise no signs of sepsis. --Maintain MAP > 65 with fluids if needed --Monitor closely        Subjective: Patient sitting up in recliner when seen on rounds this morning.  He reports being up to walk around the unit by himself this morning.  Says he felt fairly well yesterday including in the evening when he had a temp of 100.4.  Denied feeling feverish or having chills.  He does report an ongoing productive cough and some congestion that started a few days  ago.  Otherwise having a little bit more back pain today than yesterday but overall pain control has improved.   Physical Exam: Vitals:   03/29/22 1609 03/29/22 2045 03/30/22 0452 03/30/22 0825  BP: 95/69 108/71 100/70 (!) 93/58  Pulse: 78 93 81 73  Resp: 16 18 16 16   Temp: 98.8 F (37.1 C) (!) 100.4 F (38 C) 98.9 F (37.2 C) 98.2 F (36.8 C)  TempSrc:      SpO2: 93% 99% 92% 97%  Weight:      Height:       General exam: awake sitting up in recliner, alert, no acute distress HEENT: Moist mucous membranes, hearing grossly normal Respiratory system: Lungs clear bilaterally, no wheezes or rhonchi, normal respiratory effort at rest, on room air. Cardiovascular system: Normal S1/S2, RRR, no peripheral edema.   Central nervous system: A&O x3. no gross focal neurologic deficits, normal speech Extremities: no edema, normal tone Skin: dry, intact, normal temperature Psychiatry: normal mood, congruent affect   Data Reviewed:  Notable labs: No new labs this morning.    Family Communication: None   Disposition: Status is: Inpatient Remains inpatient appropriate because: On IV antibiotics and still being followed by neurosurgery, pain uncontrolled.  Unsafe to discharge with PICC line for IV antibiotics due to his history of IVDU, needs to remain in hospital for course.   Planned Discharge Destination: Home    Time spent: 35 minutes  Author: , DO 03/30/2022 2:14 PM  For on call review [email or web address].

## 2022-03-30 NOTE — Progress Notes (Signed)
   Date of Admission:  03/15/2022   T  ID: Derek Dunn is a 51 y.o. male  Principal Problem:   Epidural abscess Active Problems:   Vertebral osteomyelitis (HCC)   Right inguinal hernia   Back pain   Discitis thoracic region   Septic arthritis (HCC)   Hypotension   Insomnia    Subjective: Cough  Low grade fever Back pain  Medications:   acetaminophen  1,000 mg Oral Q8H   Chlorhexidine Gluconate Cloth  6 each Topical Daily   enoxaparin (LOVENOX) injection  40 mg Subcutaneous Q24H   gabapentin  900 mg Oral TID   mupirocin ointment   Nasal BID   polyethylene glycol  17 g Oral Daily   senna-docusate  1 tablet Oral BID   sorbitol  30 mL Oral Daily   traZODone  100 mg Oral QHS    Objective: Vital signs in last 24 hours: Patient Vitals for the past 24 hrs:  BP Temp Pulse Resp SpO2  03/30/22 1630 99/65 98.5 F (36.9 C) 76 16 98 %  03/30/22 0825 (!) 93/58 98.2 F (36.8 C) 73 16 97 %  03/30/22 0452 100/70 98.9 F (37.2 C) 81 16 92 %  03/29/22 2045 108/71 (!) 100.4 F (38 C) 93 18 99 %      PHYSICAL EXAM:  General: Alert, cooperative, no distress, appears stated age.  Head: Normocephalic, without obvious abnormality, atraumatic. Eyes: Conjunctivae clear, anicteric sclerae. Pupils are equal ENT Nares normal. No drainage or sinus tenderness. Lips, mucosa, and tongue normal. No Thrush Neck: Supple, symmetrical, no adenopathy, thyroid: non tender no carotid bruit and no JVD. Back: surgcial site covered with dressing Mild erythema over whole back    Lungs: b/l air entry Decreased bases Heart: Regular rate and rhythm, no murmur, rub or gallop. Abdomen: Soft, non-tender,not distended. Bowel sounds normal. No masses Extremities: atraumatic, no cyanosis. No edema. No clubbing Skin: Nas above Lymph: Cervical, supraclavicular normal. Neurologic: Grossly non-focal  Lab Results Recent Labs    03/28/22 0444 03/29/22 0451 03/30/22 0547  WBC 3.3* 3.0* 3.7*  HGB  7.8* 7.6* 7.6*  HCT 26.0* 25.1* 25.1*  NA 140 139  --   K 4.5 4.4  --   CL 106 107  --   CO2 27 26  --   BUN 24* 26*  --   CREATININE 0.97 1.02  --    Liver Panel Recent Labs    03/29/22 0451  PROT 5.8*  ALBUMIN 2.2*  AST 137*  ALT 114*  ALKPHOS 79  BILITOT 1.0  Microbiology:  Studies/Results: No results found.   Assessment/Plan: MSSA thoracic vertebrae infection- s/p fusion Now on cefazolin' will have to add rifampin once LFT normalizes  Nafcillin induced leucopenia and mild transaminases He does take 3 gram of tylenol which is contributing to it as well  Mild thermal injury to back from heating pad ? Calamine ? Wound care consult  Left hand eczema- can do steroid cream  Low grade fever- observe closely Covid neg - other resp viral PCR pending  Discussed the management with patient and care team

## 2022-03-30 NOTE — Assessment & Plan Note (Addendum)
Temp 100.4 noted on evening of 7/26. Secondary to acute hepatitis B infection

## 2022-03-31 ENCOUNTER — Inpatient Hospital Stay

## 2022-03-31 DIAGNOSIS — R7401 Elevation of levels of liver transaminase levels: Secondary | ICD-10-CM | POA: Diagnosis present

## 2022-03-31 DIAGNOSIS — D649 Anemia, unspecified: Secondary | ICD-10-CM

## 2022-03-31 DIAGNOSIS — G062 Extradural and subdural abscess, unspecified: Secondary | ICD-10-CM | POA: Diagnosis not present

## 2022-03-31 DIAGNOSIS — L309 Dermatitis, unspecified: Secondary | ICD-10-CM

## 2022-03-31 DIAGNOSIS — T8463XA Infection and inflammatory reaction due to internal fixation device of spine, initial encounter: Secondary | ICD-10-CM

## 2022-03-31 DIAGNOSIS — K4091 Unilateral inguinal hernia, without obstruction or gangrene, recurrent: Secondary | ICD-10-CM | POA: Diagnosis not present

## 2022-03-31 DIAGNOSIS — D72829 Elevated white blood cell count, unspecified: Secondary | ICD-10-CM

## 2022-03-31 DIAGNOSIS — M4644 Discitis, unspecified, thoracic region: Secondary | ICD-10-CM | POA: Diagnosis not present

## 2022-03-31 DIAGNOSIS — T360X5A Adverse effect of penicillins, initial encounter: Secondary | ICD-10-CM

## 2022-03-31 LAB — COMPREHENSIVE METABOLIC PANEL
ALT: 130 U/L — ABNORMAL HIGH (ref 0–44)
AST: 160 U/L — ABNORMAL HIGH (ref 15–41)
Albumin: 2.1 g/dL — ABNORMAL LOW (ref 3.5–5.0)
Alkaline Phosphatase: 98 U/L (ref 38–126)
Anion gap: 5 (ref 5–15)
BUN: 17 mg/dL (ref 6–20)
CO2: 25 mmol/L (ref 22–32)
Calcium: 8.6 mg/dL — ABNORMAL LOW (ref 8.9–10.3)
Chloride: 110 mmol/L (ref 98–111)
Creatinine, Ser: 0.75 mg/dL (ref 0.61–1.24)
GFR, Estimated: >60 mL/min (ref >60)
Glucose, Bld: 108 mg/dL — ABNORMAL HIGH (ref 70–99)
Potassium: 4.7 mmol/L (ref 3.5–5.1)
Sodium: 140 mmol/L (ref 135–145)
Total Bilirubin: 0.6 mg/dL (ref 0.3–1.2)
Total Protein: 6 g/dL — ABNORMAL LOW (ref 6.5–8.1)

## 2022-03-31 LAB — HEPATITIS PANEL, ACUTE
HCV Ab: REACTIVE — AB
Hep A IgM: NONREACTIVE
Hep B C IgM: REACTIVE — AB
Hepatitis B Surface Ag: REACTIVE — AB

## 2022-03-31 LAB — EXPECTORATED SPUTUM ASSESSMENT W GRAM STAIN, RFLX TO RESP C

## 2022-03-31 LAB — CBC
HCT: 25.7 % — ABNORMAL LOW (ref 39.0–52.0)
Hemoglobin: 7.7 g/dL — ABNORMAL LOW (ref 13.0–17.0)
MCH: 24.9 pg — ABNORMAL LOW (ref 26.0–34.0)
MCHC: 30 g/dL (ref 30.0–36.0)
MCV: 83.2 fL (ref 80.0–100.0)
Platelets: 355 10*3/uL (ref 150–400)
RBC: 3.09 MIL/uL — ABNORMAL LOW (ref 4.22–5.81)
RDW: 18.7 % — ABNORMAL HIGH (ref 11.5–15.5)
WBC: 3.8 10*3/uL — ABNORMAL LOW (ref 4.0–10.5)
nRBC: 0.5 % — ABNORMAL HIGH (ref 0.0–0.2)

## 2022-03-31 LAB — SEDIMENTATION RATE: Sed Rate: 68 mm/hr — ABNORMAL HIGH (ref 0–20)

## 2022-03-31 LAB — C-REACTIVE PROTEIN: CRP: 3.5 mg/dL — ABNORMAL HIGH (ref <1.0)

## 2022-03-31 MED ORDER — CLOBETASOL PROPIONATE 0.05 % EX CREA
TOPICAL_CREAM | Freq: Two times a day (BID) | CUTANEOUS | Status: DC
Start: 2022-03-31 — End: 2022-04-17
  Administered 2022-04-03 – 2022-04-07 (×3): 1 via TOPICAL
  Filled 2022-03-31: qty 15

## 2022-03-31 MED ORDER — IOHEXOL 9 MG/ML PO SOLN
500.0000 mL | ORAL | Status: AC
  Administered 2022-03-31 (×2): 500 mL via ORAL

## 2022-03-31 NOTE — Progress Notes (Signed)
Mobility Specialist - Progress Note    03/31/22 1000  Mobility  Activity Ambulated independently in hallway  Level of Assistance Independent  Assistive Device Front wheel walker  Distance Ambulated (ft) 700 ft  Activity Response Tolerated well  $Mobility charge 1 Mobility    Pt ambulated 4 laps around NS indep. voicing no complaints using RW. Pt shows no signs of distress and tolerates well.  Clarisa Schools Mobility Specialist 03/31/22, 10:05 AM

## 2022-03-31 NOTE — Progress Notes (Addendum)
Progress Note   Patient: Derek Dunn [id number]:[id number] DOB: 1971/04/30 DOA: 03/15/2022     16 DOS: the patient was seen and examined on 03/31/2022   Brief hospital course: [name] is a 51 year old male with PMH polysubstance abuse including IV drug use in the past, housing insecurity (incarcerated past 2 months, just released (as of 7/12), hospital admission in May 2023 with MSSA bacteremia secondary to spinal osteomyelitis and epidural abscess, recurrent right inguinal hernia, history of signing out AMA.  He presented to the ED from [facility] on 03/15/2022 with complaints of worsening back pain and recurrent right groin pain.  No reported fevers.  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  Labs were mostly unremarkable including no leukocytosis and normal lactic acid.  Patient was started on broad-spectrum IV antibiotic coverage and admitted for further evaluation and management.  Interventional radiology was consulted for abscess aspiration for cultures, performed on 7/12. Infectious disease was also consulted.   Neurosurgery consulted for consideration of washout in the OR, given poor likelihood of full resolution with antibiotics alone. He was taken to the OR for washout on 03/24/22.  Underwent posterior laminectomy T8-9 for evacuation of epidural abscess, posterolateral arthrodesis T7 to T12 with T7-12 posterior instrumentation.    Consultants: Neurosurgery Infectious Disease    Assessment and Plan: * Epidural abscess Acute osteomyelitis discitis and facet joint septic arthritis at T8-9 with epidural abscess. Severe spinal stenosis at T8-T9. Acute septic arthritis at the left L5-S1 facet. History of IV drug use. Prior history MSSA bacteremia. --Initially on IV Cefepime, Vancomycin -- IR consulted, performed CT-guided aspiration for culture  on 7/12 --Infectious disease consulted  --Was on nafcillin continuous IV infusion --Antibiotic changed to cefazolin on 7/24 due to rising LFTs --Neurosurgery consulted.  Patient underwent T7-12 posterior instrumentation with decompression and washout of epidural abscess at T8-9 (performed on 03/24/22) -- Drain removed on 03/27/2022  -- Wound VAC removed 26 --Neurosurgery recommends: Mobilize, pain control, resume DVT prophylaxis, PT/OT -- Follow-up with neurosurgery in 2 weeks  7/28: per Neurosurgery, pt no longer needs dressing over incision.  May shower and pat incision area dry.  Septic arthritis (HCC) Signs of septic arthritis at level of L5-S1 seen on MRI lumbar spine.  Management as outlined, see epidural abscess.  Discitis thoracic region See epidural abscess for plan  Back pain Secondary to spinal infection and now post op pain as expected - continue pain regimen and abx   Vertebral osteomyelitis (HCC) Management as outlined  Right inguinal hernia S/p reduction in the ED.   --Pain control as needed --Monitor closely for signs of bowel obstruction or incarceration --Surgical consult if recurrent - continue laxative regimen -repeat CT abd/pelvis today given worsening discomfort per patient and fevers of unclear source  Fever Temp 100.4 noted on evening of 7/26. Pt denies symptoms at the time.  Does report some congestion and productive cough. 7/27: Negative for Covid-19, Flu A/B 7/28: Surgical incision without signs of infection (see image) --Follow pending respiratory viral panel --Check sputum culture --CT abd/pelvis to re-assess hernia  --Remains on Ancef  --Monitor CBC, fever curve --Monitor clinically for s/sx's of infection  Normocytic anemia Hbg 11.3 on admission, had down-trended since admission likely due to sepsis/infection and some blood loss in surgery. Hbg stable in upper 7's past several days and no evidence of bleeding --check anemia panel with AM  labs --further  evaluation as needed --monitor CBC   Transaminitis LFT's on 7/24 noted to have trended up. Nafcillin infusion changed to Ancef per ID. Stop scheduled Tylenol, use PRN for fevers. --Follow LFT's  Insomnia Trazodone at bedtime ordered.  Hypotension BP as low as 87/61 on AM of 7/13, suspect due to pain medications.  Otherwise no signs of sepsis. --Maintain MAP > 65 with fluids if needed --Monitor closely        Subjective: Patient sitting up in recliner when seen on rounds this morning.  He had another fever 100.6 F overnight.  Says he feels fine this AM, denies any symptoms.  Hernia continues to bother him more than normal.  Has been up ambulating on the unit.  Continues to have productive cough.  Asks if he can take a shower today.     Physical Exam: Vitals:   03/30/22 1630 03/30/22 2122 03/31/22 0608 03/31/22 0801  BP: 99/65 111/72 103/68 94/63  Pulse: 76 90 80 92  Resp: 16 16 16 17   Temp: 98.5 F (36.9 C) (!) 100.6 F (38.1 C) 98.8 F (37.1 C) 98.5 F (36.9 C)  TempSrc:      SpO2: 98% 95% 95% 100%  Weight:      Height:       General exam: awake sitting up in recliner, alert, no acute distress HEENT: Moist mucous membranes, hearing grossly normal Respiratory system: Lungs clear bilaterally, no wheezes or rhonchi, normal respiratory effort at rest, on room air. Cardiovascular system: Normal S1/S2, RRR, no peripheral edema.   Central nervous system: A&O x3. no gross focal neurologic deficits, normal speech Extremities: no edema, normal tone Skin: IMAGE BELOW of back incision and thermal injury area on right lower back Psychiatry: normal mood, congruent affect     Data Reviewed:  Notable labs: No new labs this morning.    Family Communication: None   Disposition: Status is: Inpatient Remains inpatient appropriate because: On IV antibiotics and still being followed by neurosurgery, pain uncontrolled.  Unsafe to discharge with PICC line for IV  antibiotics due to his history of IVDU, needs to remain in hospital for course.   Planned Discharge Destination: Home    Time spent: 45 minutes  Author: , DO 03/31/2022 3:47 PM  For on call review [email or web address].

## 2022-03-31 NOTE — Assessment & Plan Note (Signed)
Hbg 11.3 on admission, had down-trended since admission likely due to sepsis/infection and some blood loss in surgery. Hbg stable in upper 7's past several days and no evidence of bleeding --check anemia panel with AM labs --further evaluation as needed --monitor CBC

## 2022-03-31 NOTE — Progress Notes (Signed)
   Date of Admission:  03/15/2022   T  ID: Derek Dunn is a 51 y.o. male  Principal Problem:   Epidural abscess Active Problems:   Fever   Vertebral osteomyelitis (HCC)   Right inguinal hernia   Back pain   Discitis thoracic region   Septic arthritis (HCC)   Hypotension   Insomnia    Subjective: Has been having fever at night time Minimal cough Some clear sputum Also has back pain and rt groin pain  Medications:   calamine  1 Application Topical BID   Chlorhexidine Gluconate Cloth  6 each Topical Daily   enoxaparin (LOVENOX) injection  40 mg Subcutaneous Q24H   gabapentin  900 mg Oral TID   mupirocin ointment   Nasal BID   polyethylene glycol  17 g Oral Daily   senna-docusate  1 tablet Oral BID   sorbitol  30 mL Oral Daily   traZODone  100 mg Oral QHS    Objective: Vital signs in last 24 hours: Patient Vitals for the past 24 hrs:  BP Temp Pulse Resp SpO2  03/31/22 0801 94/63 98.5 F (36.9 C) 92 17 100 %  03/31/22 0608 103/68 98.8 F (37.1 C) 80 16 95 %  03/30/22 2122 111/72 (!) 100.6 F (38.1 C) 90 16 95 %  03/30/22 1630 99/65 98.5 F (36.9 C) 76 16 98 %      PHYSICAL EXAM:  General: Alert, cooperative, no distress, appears stated age.  Head: Normocephalic, without obvious abnormality, atraumatic. Eyes: Conjunctivae clear, anicteric sclerae. Pupils are equal ENT Nares normal. No drainage or sinus tenderness. Lips, mucosa, and tongue normal. No Thrush Neck: Supple, symmetrical, no adenopathy, thyroid: non tender no carotid bruit and no JVD. Back: surgcial site covered with dressing    Lungs: b/l air entry Decreased bases Heart: Regular rate and rhythm, no murmur, rub or gallop. Abdomen: Soft, non-tender,not distended. Bowel sounds normal. No masses Rt groin mass extending down Not reducible Extremities: atraumatic, no cyanosis. No edema. No clubbing Skin: Nas above Lymph: Cervical, supraclavicular normal. Neurologic: Grossly non-focal  Lab  Results Recent Labs    03/29/22 0451 03/30/22 0547 03/31/22 0427  WBC 3.0* 3.7* 3.8*  HGB 7.6* 7.6* 7.7*  HCT 25.1* 25.1* 25.7*  NA 139  --  140  K 4.4  --  4.7  CL 107  --  110  CO2 26  --  25  BUN 26*  --  17  CREATININE 1.02  --  0.75   Liver Panel Recent Labs    03/29/22 0451 03/31/22 0427  PROT 5.8* 6.0*  ALBUMIN 2.2* 2.1*  AST 137* 160*  ALT 114* 130*  ALKPHOS 79 98  BILITOT 1.0 0.6  Microbiology:    Assessment/Plan: MSSA thoracic vertebrae infection- s/p fusion Now on cefazolin' will have to add rifampin once LFT normalizes  Fever low grade- surgical site looks well approximated- watch closely  Has rt inguinal hernia not reducible- t/o incarceration- CT done today Covid neg Resp viral PCR pending  Nafcillin induced leucopenia and mild transaminases He does take 3 gram of tylenol which is contributing to it as well. It has been stooped  Mild thermal injury to back from heating pad ? Calamine ? Wound care consult  Left hand eczema- can do steroid cream   Discussed the management with patient and care team ID will follow him peripherally this weekend- call if needed

## 2022-03-31 NOTE — Progress Notes (Addendum)
Physical Therapy Treatment Patient Details Name: Derek Dunn MRN: [id number] DOB: [birth year] Today's Date: 03/31/2022   History of Present Illness Pt is a 32 male that presented to ED from [facility] 7/12 with complaints of worsening back pain and recurrent right groin pain. 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 PMH of polysubstance abuse including IV drug use in the past, housing insecurity. Pt with washout on 7/21 with continue upon transfer orders. PT treatment/assessment performed this date.    PT Comments    Pt seen for PT tx. Pt reports he has already ambulated around nurses station multiple times without AD but is agreeable to tx. Pt ambulates 1 lap & to stairwell & back without AD & supervision with WNL gait speed. Pt negotiates 7 steps with 1 rail & supervision. Pt is eager to d/c to his grandmother's house in [place]. Due to improvement in progress, have updated d/c recommendations to HHPT in anticipation of him going to his grandmother's house. Will continue to follow pt acutely to address endurance & high level balance.  Of note, pt on room air throughout session; pt with increased work of breathing after stair negotiation with HR 140 bpm, SpO2 87% but re-checked once in room & SpO2 >90%, HR 104 bpm. Team notified via secure chat.    Recommendations for follow up therapy are one component of a multi-disciplinary discharge planning process, led by the attending physician.  Recommendations may be updated based on patient status, additional functional criteria and insurance authorization.  Follow Up Recommendations  Home health PT Can patient physically be transported by private vehicle: Yes   Assistance Recommended at Discharge PRN  Patient can return home with the following A little help with walking and/or  transfers;A little help with bathing/dressing/bathroom;Assistance with cooking/housework;Help with stairs or ramp for entrance;Assist for transportation   Equipment Recommendations  None recommended by PT    Recommendations for Other Services       Precautions / Restrictions Precautions Precautions: Fall Restrictions Weight Bearing Restrictions: No     Mobility  Bed Mobility               General bed mobility comments: not tested, pt received & left sitting in recliner    Transfers Overall transfer level: Modified independent Equipment used: None Transfers: Sit to/from Stand Sit to Stand: Modified independent (Device/Increase time)           General transfer comment: STS from recliner without assistance    Ambulation/Gait Ambulation/Gait assistance: Supervision Gait Distance (Feet): 300 Feet Assistive device: None Gait Pattern/deviations: Decreased weight shift to left Gait velocity: WNL     General Gait Details: Pt ambulated around nurses station x 1 time without AD & supervision, plus to stairwell & back.   Stairs Stairs: Yes Stairs assistance: Supervision Stair Management: One rail Right Number of Stairs: 7 General stair comments: Pt ascends stairs with R ascending rail & step over step pattern, descends with R rail with step to pattern.   Wheelchair Mobility    Modified Rankin (Stroke Patients Only)       Balance     Sitting balance-Leahy Scale: Good     Standing balance support: During functional activity, No upper extremity supported Standing balance-Leahy Scale: Good  Cognition Arousal/Alertness: Awake/alert Behavior During Therapy: WFL for tasks assessed/performed Overall Cognitive Status: Within Functional Limits for tasks assessed                                          Exercises      General Comments        Pertinent Vitals/Pain Pain Assessment Pain  Assessment: 0-10 Pain Score: 7  Pain Location: upper part of incision Pain Descriptors / Indicators: Burning Pain Intervention(s): Monitored during session, Patient requesting pain meds-RN notified    Home Living                          Prior Function            PT Goals (current goals can now be found in the care plan section) Acute Rehab PT Goals Patient Stated Goal: to d/c to his grandmother's house PT Goal Formulation: With patient Time For Goal Achievement: 04/05/22 Potential to Achieve Goals: Good Progress towards PT goals: Progressing toward goals    Frequency    Min 2X/week      PT Plan Discharge plan needs to be updated    Co-evaluation              AM-PAC PT "6 Clicks" Mobility   Outcome Measure  Help needed turning from your back to your side while in a flat bed without using bedrails?: None Help needed moving from lying on your back to sitting on the side of a flat bed without using bedrails?: None Help needed moving to and from a bed to a chair (including a wheelchair)?: None Help needed standing up from a chair using your arms (e.g., wheelchair or bedside chair)?: None Help needed to walk in hospital room?: A Little Help needed climbing 3-5 steps with a railing? : A Little 6 Click Score: 22    End of Session   Activity Tolerance: Patient tolerated treatment well;Patient limited by fatigue Patient left: in chair;with call bell/phone within reach   PT Visit Diagnosis: Other abnormalities of gait and mobility (R26.89);Difficulty in walking, not elsewhere classified (R26.2);Muscle weakness (generalized) (M62.81);Pain Pain - Right/Left:  (top) Pain - part of body:  (back)     Time: 9892-1194 PT Time Calculation (min) (ACUTE ONLY): 8 min  Charges:  $Therapeutic Activity: 8-22 mins                     Aleda Grana, PT, DPT 03/31/22, 11:49 AM   [name] 03/31/2022, 11:47 AM

## 2022-03-31 NOTE — Assessment & Plan Note (Addendum)
Likely secondary to acute hepatitis B infection. LFTs continue to rise with AST 2524, ALT 923 and bilirubin of 7.0, INR 1.2.  Gabapentin discontinued.  Does not want to come down on the Suboxone.  Case discussed with gastroenterology.  Gastroenterology would like the liver function test to stabilize prior to disposition.  IV fluids started yesterday.

## 2022-04-01 DIAGNOSIS — B169 Acute hepatitis B without delta-agent and without hepatic coma: Secondary | ICD-10-CM | POA: Diagnosis present

## 2022-04-01 DIAGNOSIS — G062 Extradural and subdural abscess, unspecified: Secondary | ICD-10-CM | POA: Diagnosis not present

## 2022-04-01 DIAGNOSIS — R768 Other specified abnormal immunological findings in serum: Secondary | ICD-10-CM | POA: Diagnosis present

## 2022-04-01 DIAGNOSIS — E611 Iron deficiency: Secondary | ICD-10-CM | POA: Clinically undetermined

## 2022-04-01 LAB — RETICULOCYTES
Immature Retic Fract: 31.5 % — ABNORMAL HIGH (ref 2.3–15.9)
RBC.: 2.92 MIL/uL — ABNORMAL LOW (ref 4.22–5.81)
Retic Count, Absolute: 120 10*3/uL (ref 19.0–186.0)
Retic Ct Pct: 4.1 % — ABNORMAL HIGH (ref 0.4–3.1)

## 2022-04-01 LAB — HEPATIC FUNCTION PANEL
ALT: 132 U/L — ABNORMAL HIGH (ref 0–44)
AST: 161 U/L — ABNORMAL HIGH (ref 15–41)
Albumin: 2.3 g/dL — ABNORMAL LOW (ref 3.5–5.0)
Alkaline Phosphatase: 128 U/L — ABNORMAL HIGH (ref 38–126)
Bilirubin, Direct: 0.2 mg/dL (ref 0.0–0.2)
Indirect Bilirubin: 0.8 mg/dL (ref 0.3–0.9)
Total Bilirubin: 1 mg/dL (ref 0.3–1.2)
Total Protein: 6 g/dL — ABNORMAL LOW (ref 6.5–8.1)

## 2022-04-01 LAB — FERRITIN: Ferritin: 311 ng/mL (ref 24–336)

## 2022-04-01 LAB — CBC
HCT: 25.1 % — ABNORMAL LOW (ref 39.0–52.0)
Hemoglobin: 7.6 g/dL — ABNORMAL LOW (ref 13.0–17.0)
MCH: 25.4 pg — ABNORMAL LOW (ref 26.0–34.0)
MCHC: 30.3 g/dL (ref 30.0–36.0)
MCV: 83.9 fL (ref 80.0–100.0)
Platelets: 357 10*3/uL (ref 150–400)
RBC: 2.99 MIL/uL — ABNORMAL LOW (ref 4.22–5.81)
RDW: 19.1 % — ABNORMAL HIGH (ref 11.5–15.5)
WBC: 3.6 10*3/uL — ABNORMAL LOW (ref 4.0–10.5)
nRBC: 0 % (ref 0.0–0.2)

## 2022-04-01 LAB — IRON AND TIBC
Iron: 36 ug/dL — ABNORMAL LOW (ref 45–182)
Saturation Ratios: 13 % — ABNORMAL LOW (ref 17.9–39.5)
TIBC: 273 ug/dL (ref 250–450)
UIBC: 237 ug/dL

## 2022-04-01 LAB — PROCALCITONIN: Procalcitonin: 0.23 ng/mL

## 2022-04-01 LAB — VITAMIN B12: Vitamin B-12: 499 pg/mL (ref 180–914)

## 2022-04-01 LAB — FOLATE: Folate: 11.7 ng/mL (ref >5.9)

## 2022-04-01 MED ORDER — FERROUS SULFATE 325 (65 FE) MG PO TABS
325.0000 mg | ORAL_TABLET | Freq: Every day | ORAL | Status: DC
  Administered 2022-04-02 – 2022-04-23 (×22): 325 mg via ORAL
  Filled 2022-04-01 (×22): qty 1

## 2022-04-01 NOTE — Progress Notes (Signed)
Progress Note   Patient: Derek Dunn HFW:[id number] DOB: 02-27-1971 DOA: 03/15/2022     17 DOS: the patient was seen and examined on 04/01/2022   Brief hospital course: Mr. Barrales is a 51 year old male with PMH polysubstance abuse including IV drug use in the past, housing insecurity (incarcerated past 2 months, just released (as of 7/12), hospital admission in May 2023 with MSSA bacteremia secondary to spinal osteomyelitis and epidural abscess, recurrent right inguinal hernia, history of signing out AMA.  He presented to the ED from [facility] on 03/15/2022 with complaints of worsening back pain and recurrent right groin pain.  No reported fevers.  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  Labs were mostly unremarkable including no leukocytosis and normal lactic acid.  Patient was started on broad-spectrum IV antibiotic coverage and admitted for further evaluation and management.  Interventional radiology was consulted for abscess aspiration for cultures, performed on 7/12. Infectious disease was also consulted.   Neurosurgery consulted for consideration of washout in the OR, given poor likelihood of full resolution with antibiotics alone. He was taken to the OR for washout on 03/24/22.  Underwent posterior laminectomy T8-9 for evacuation of epidural abscess, posterolateral arthrodesis T7 to T12 with T7-12 posterior instrumentation.    Consultants: Neurosurgery Infectious Disease    Assessment and Plan: * Epidural abscess Acute osteomyelitis discitis and facet joint septic arthritis at T8-9 with epidural abscess. Severe spinal stenosis at T8-T9. Acute septic arthritis at the left L5-S1 facet. History of IV drug use. Prior history MSSA bacteremia. --Initially on IV Cefepime, Vancomycin -- IR consulted, performed CT-guided aspiration for culture  on 7/12 --Infectious disease consulted  --Was on nafcillin continuous IV infusion --Antibiotic changed to cefazolin on 7/24 due to rising LFTs --Neurosurgery consulted.  Patient underwent T7-12 posterior instrumentation with decompression and washout of epidural abscess at T8-9 (performed on 03/24/22) -- Drain removed on 03/27/2022  -- Wound VAC removed 26 --Neurosurgery recommends: Mobilize, pain control, resume DVT prophylaxis, PT/OT -- Follow-up with neurosurgery in 2 weeks  7/28: per Neurosurgery, pt no longer needs dressing over incision.  May shower and pat incision area dry.  Septic arthritis (HCC) Signs of septic arthritis at level of L5-S1 seen on MRI lumbar spine.  Management as outlined, see epidural abscess.  Discitis thoracic region See epidural abscess for plan  Back pain Secondary to spinal infection and now post op pain as expected - continue pain regimen and abx   Vertebral osteomyelitis (HCC) Management as outlined  Right inguinal hernia S/p reduction in the ED.   --Pain control as needed --Monitor closely for signs of bowel obstruction or incarceration --Surgical consult if recurrent - continue laxative regimen -repeat CT abd/pelvis today given worsening discomfort per patient and fevers of unclear source  Fever Temp 100.4 noted on evening of 7/26. Pt denies symptoms at the time.  Does report some congestion and productive cough. 7/27: Negative for Covid-19, Flu A/B 7/28: Surgical incision without signs of infection (see image) --Follow pending respiratory viral panel --Check sputum culture --CT abd/pelvis to re-assess hernia  --Remains on Ancef  --Monitor CBC, fever curve --Monitor clinically for s/sx's of infection  Hepatitis C antibody test positive HCV Ab reactive this admission.  Pt reports being told he had hepatitis C in the past.   --ID following  Acute hepatitis B Due to rising LFT's hepatitis screening done and resulted with reactive  HBVsAg  and HBV Ab IgM are reactive, suggests acute infection. Pt endorses IVDU.  States he's had hepatitis C before (also HCV Ab reactive). --Check HBV quant PCR and genotype, HBV eAg --ID is following  Normocytic anemia Hbg 11.3 on admission, had down-trended since admission likely due to sepsis/infection and some blood loss in surgery. Hbg stable in upper 7's past several days and no evidence of bleeding --check anemia panel with AM labs --further evaluation as needed --monitor CBC   Transaminitis LFT's on 7/24 noted to have trended up. Nafcillin infusion changed to Ancef per ID. Stop scheduled Tylenol, use PRN for fevers. --Follow LFT's  Insomnia Trazodone at bedtime ordered.  Hypotension BP as low as 87/61 on AM of 7/13, suspect due to pain medications.  Otherwise no signs of sepsis. --Maintain MAP > 65 with fluids if needed --Monitor closely        Subjective: Patient awake resting in bed when seen today.  He reports still having intermittent scrotal/penile pain and a spot that swells.  Area examined today with RN chaperone and it appears normal at this time.  Pt reports intermittent abdominal pain related to hernia.  Febrile again overnight.  Informed pt of his reactive Hep B and C labs.  He says he's "had hepatitis C before", not B, asks how he could get it... he admitted to IV drug use and not always with fresh sterile needles.  He denies other complaints at this time.    Physical Exam: Vitals:   03/31/22 2110 03/31/22 2347 04/01/22 0443 04/01/22 0811  BP: 105/63 (!) 97/49 104/60 96/62  Pulse: 93 90 86 79  Resp: 16 16 16 17   Temp: 100 F (37.8 C) 99.4 F (37.4 C) 99.1 F (37.3 C) 99 F (37.2 C)  TempSrc: Oral Oral    SpO2: 96% 91% 93% 95%  Weight:      Height:       General exam: awake sitting up in bed, alert, no acute distress Respiratory system: normal respiratory effort at rest, on room air. Cardiovascular system: RRR, no peripheral edema.   Central nervous  system: A&O x3. no gross focal neurologic deficits, normal speech Extremities: no edema, normal tone Genitourinary: no swelling, discoloration, masses or lesions seen or palpated on penis or testes, mild R inguinal swelling due to hernia (remains reducible) Psychiatry: normal mood, congruent affect   Data Reviewed:  Notable labs: Reactive Hep B sAg and Ab IgM.  Reactive HCV Ab.. Iron 36, sat ratio 13%, elevated reticulocyte count & fraction, B12 normal 499, folate normal 11.7, procal 0.23,  AST 160 >> 161, ALT 130 >> 132, Tprotein 6.0   Sputum culture growing abdundant GNR's, pending Repeat Blood cutlures 7/28 pending  Family Communication: None   Disposition: Status is: Inpatient Remains inpatient appropriate because: On IV antibiotics and still being followed by neurosurgery, pain uncontrolled.  Unsafe to discharge with PICC line for IV antibiotics due to his history of IVDU, needs to remain in hospital for course.   Planned Discharge Destination: Home    Time spent: 45 minutes  Author: 8/28, DO 04/01/2022 2:12 PM  For on call review [email or web address].

## 2022-04-01 NOTE — Assessment & Plan Note (Signed)
HCV Ab reactive this admission.  Pt reports being told he had hepatitis C in the past.   --ID following

## 2022-04-01 NOTE — Assessment & Plan Note (Signed)
Mild.  Start oral iron supplement.

## 2022-04-01 NOTE — Assessment & Plan Note (Addendum)
Due to rising LFT's hepatitis screening done and resulted with reactive HBVsAg and HBV Ab IgM are reactive, viral load > 1 billion --Consulted GI, no treatment indicated --Outpatient GI follow up to document clearance --Follow up pending HepB eAg --ID is also following --Monitor LFT's, INR

## 2022-04-02 ENCOUNTER — Encounter: Payer: Self-pay | Admitting: Internal Medicine

## 2022-04-02 DIAGNOSIS — G062 Extradural and subdural abscess, unspecified: Secondary | ICD-10-CM | POA: Diagnosis not present

## 2022-04-02 DIAGNOSIS — K4090 Unilateral inguinal hernia, without obstruction or gangrene, not specified as recurrent: Secondary | ICD-10-CM | POA: Diagnosis not present

## 2022-04-02 DIAGNOSIS — K429 Umbilical hernia without obstruction or gangrene: Secondary | ICD-10-CM

## 2022-04-02 LAB — CBC
HCT: 24.3 % — ABNORMAL LOW (ref 39.0–52.0)
Hemoglobin: 7.2 g/dL — ABNORMAL LOW (ref 13.0–17.0)
MCH: 25.3 pg — ABNORMAL LOW (ref 26.0–34.0)
MCHC: 29.6 g/dL — ABNORMAL LOW (ref 30.0–36.0)
MCV: 85.3 fL (ref 80.0–100.0)
Platelets: 325 10*3/uL (ref 150–400)
RBC: 2.85 MIL/uL — ABNORMAL LOW (ref 4.22–5.81)
RDW: 19.2 % — ABNORMAL HIGH (ref 11.5–15.5)
WBC: 2.9 10*3/uL — ABNORMAL LOW (ref 4.0–10.5)
nRBC: 0 % (ref 0.0–0.2)

## 2022-04-02 LAB — HEPATIC FUNCTION PANEL
ALT: 146 U/L — ABNORMAL HIGH (ref 0–44)
AST: 192 U/L — ABNORMAL HIGH (ref 15–41)
Albumin: 2.2 g/dL — ABNORMAL LOW (ref 3.5–5.0)
Alkaline Phosphatase: 149 U/L — ABNORMAL HIGH (ref 38–126)
Bilirubin, Direct: 0.2 mg/dL (ref 0.0–0.2)
Indirect Bilirubin: 0.7 mg/dL (ref 0.3–0.9)
Total Bilirubin: 0.9 mg/dL (ref 0.3–1.2)
Total Protein: 5.9 g/dL — ABNORMAL LOW (ref 6.5–8.1)

## 2022-04-02 NOTE — Progress Notes (Signed)
Progress Note   Patient: Derek Dunn XNA:[id number] DOB: 01-31-1971 DOA: 03/15/2022     18 DOS: the patient was seen and examined on 04/02/2022   Brief hospital course: Mr. Ruggiero is a 51 year old male with PMH polysubstance abuse including IV drug use in the past, housing insecurity (incarcerated past 2 months, just released (as of 7/12), hospital admission in May 2023 with MSSA bacteremia secondary to spinal osteomyelitis and epidural abscess, recurrent right inguinal hernia, history of signing out AMA.  He presented to the ED from [facility] on 03/15/2022 with complaints of worsening back pain and recurrent right groin pain.  No reported fevers.  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  Labs were mostly unremarkable including no leukocytosis and normal lactic acid.  Patient was started on broad-spectrum IV antibiotic coverage and admitted for further evaluation and management.  Interventional radiology was consulted for abscess aspiration for cultures, performed on 7/12. Infectious disease was also consulted.   Neurosurgery consulted for consideration of washout in the OR, given poor likelihood of full resolution with antibiotics alone. He was taken to the OR for washout on 03/24/22.  Underwent posterior laminectomy T8-9 for evacuation of epidural abscess, posterolateral arthrodesis T7 to T12 with T7-12 posterior instrumentation.    Consultants: Neurosurgery Infectious Disease    Assessment and Plan: * Epidural abscess Acute osteomyelitis discitis and facet joint septic arthritis at T8-9 with epidural abscess. Severe spinal stenosis at T8-T9. Acute septic arthritis at the left L5-S1 facet. History of IV drug use. Prior history MSSA bacteremia. --Initially on IV Cefepime, Vancomycin -- IR consulted, performed CT-guided aspiration for culture  on 7/12 --Infectious disease consulted  --Was on nafcillin continuous IV infusion --Antibiotic changed to cefazolin on 7/24 due to rising LFTs --Neurosurgery consulted.  Patient underwent T7-12 posterior instrumentation with decompression and washout of epidural abscess at T8-9 (performed on 03/24/22) -- Drain removed on 03/27/2022  -- Wound VAC removed 26 --Neurosurgery recommends: Mobilize, pain control, resume DVT prophylaxis, PT/OT -- Follow-up with neurosurgery in 2 weeks  7/28: per Neurosurgery, pt no longer needs dressing over incision.  May shower and pat incision area dry.  Septic arthritis (HCC) Signs of septic arthritis at level of L5-S1 seen on MRI lumbar spine.  Management as outlined, see epidural abscess.  Discitis thoracic region See epidural abscess for plan  Back pain Secondary to spinal infection and now post op pain as expected - continue pain regimen and abx   Vertebral osteomyelitis (HCC) Management as outlined  Right inguinal hernia S/p reduction in the ED.   --Pain control as needed --Monitor closely for signs of bowel obstruction or incarceration --Surgical consult if recurrent - continue laxative regimen -repeat CT abd/pelvis today given worsening discomfort per patient and fevers of unclear source  Fever Temp 100.4 noted on evening of 7/26. Continues to spike low grade fevers since. 7/27: Negative for Covid-19, Flu A/B 7/28: Surgical incision without signs of infection (see image), repeat CT abd/pelvis no acute findings and hernia appeared improved vs prior scan 7/29: Noted HBV and HCV reactive, confirmatory labs pending  --Follow pending respiratory viral panel --Follow pending sputum culture  --Repeat blood cx's from 7/28 negative to date --Remains on Ancef  --Monitor CBC, fever curve --Monitor clinically for s/sx's of infection --Await confirmatory HBV and HCV quant's & genotypes --ID following  Iron deficiency Mild.  Start oral iron  supplement.  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  Pt reports being told he had hepatitis C in the past.   --HCV quant and genotype pending --ID following  Acute hepatitis B Due to rising LFT's hepatitis screening done and resulted with reactive HBVsAg and HBV Ab IgM are reactive, suggests acute infection. Pt endorses IVDU.  States he's had hepatitis C before (also HCV Ab reactive). --Pending: HBV quant PCR and genotype, HBV eAg --ID is following  Normocytic anemia Hbg 11.3 on admission, had down-trended since admission likely due to sepsis/infection and some blood loss in surgery. Hbg stable in upper 7's past several days and no evidence of bleeding --check anemia panel with AM labs --further evaluation as needed --monitor CBC   Transaminitis LFT's on 7/24 noted to have trended up. Nafcillin infusion changed to Ancef per ID. Stop scheduled Tylenol, use PRN for fevers. --Follow LFT's  Insomnia Trazodone at bedtime ordered.  Hypotension BP as low as 87/61 on AM of 7/13, suspect due to pain medications.  Otherwise no signs of sepsis. --Maintain MAP > 65 with fluids if needed --Monitor closely        Subjective: Patient awake resting in bed when seen today.  He reports having episode of abdominal pain when he got up earlier this morning.  Feeling better now.  Reports no BM but feels he may have 1 today.  Wonders if abdominal pain is from this.  He says his abdomen feels more distended than usual.  Continues to report focal swelling on his penis that he notices when he is up ambulating and then it goes away.  Had another low-grade fever overnight but reports feeling okay in general.  No other acute complaints at this time.   Physical Exam: Vitals:   04/01/22 2048 04/02/22 0133 04/02/22 0542 04/02/22 0754  BP: 109/74 (!) 94/50 (!) 93/58 (!) 85/64  Pulse: 98 88 77 88  Resp: '16 16 16 18  ' Temp: 99.6 F (37.6 C) 100.1 F (37.8 C) 99.3 F (37.4  C) 97.9 F (36.6 C)  TempSrc: Oral Oral Oral   SpO2: 100% 93% 94% 98%  Weight:      Height:       General exam: awake sitting up in bed, alert, no acute distress Respiratory system: normal respiratory effort at rest, on room air, CTA B without wheezes or rhonchi. Cardiovascular system: RRR, no peripheral edema.   Central nervous system: A&O x3. no gross focal neurologic deficits, normal speech Extremities: Trace lower extremity edema, normal tone, SCDs on bilateral lower extremities Gastrointestinal system: Abdomen is mildly distended, nontender Psychiatry: normal mood, congruent affect   Data Reviewed:  Notable labs --- alk phos 149 from 128 trending up, albumin 2.2, AST 192 from 161, ALT 146 from 132, hemoglobin 7.2 down from 7.6, WBC 2.9 down from 3.6  Family Communication: attempted to update pt's aunt Langley Gauss by phone this afternoon [phone], got voicemail.  Will attempt to call again.   Disposition: Status is: Inpatient Remains inpatient appropriate because: On IV antibiotics and still being followed by neurosurgery, pain uncontrolled.  Unsafe to discharge with PICC line for IV antibiotics due to his history of IVDU, needs to remain in hospital for course.   Planned Discharge Destination: Home    Time spent: 35 minutes  Author: Ezekiel Slocumb, DO 04/02/2022 3:07 PM  For on call review [email or web address].

## 2022-04-02 NOTE — Progress Notes (Signed)
Patient ambulated around the unit several times. Complained of back pain and asked for robaxin and shortly after that asked for oxycodone. Has +2 edema to BLE. Gave creams and instructed patient on how to apply to groin area. Went in to give IV abx after receiving it from pharmacy and patient was lethargic but answered to voice. Denied additional needs.

## 2022-04-02 NOTE — Consult Note (Signed)
Date of Consultation:  04/02/2022  Requesting Physician:  Esaw Grandchild, DO  Reason for Consultation:  Right inguinal hernia  History of Present Illness: Derek Dunn is a 51 y.o. male admitted on 03/15/2022 with an epidural abscess.  He does have a history of polysubstance use disorder, homelessness and prior admissions for MSSA bacteremia.  He was previously admitted on 01/04/2022 and left AGAINST MEDICAL ADVICE on 01/06/2022 for the same.  He presented back to the emergency room 01/07/2022 due to a right inguinal hernia that has become more painful that day.  His inguinal hernia was reduced successfully and he was admitted for management of his bacteremia.  However, he left again AGAINST MEDICAL ADVICE on 01/08/2022.  Eventually, the patient came back on 03/15/2022 with worsening back pain starting in the middle of his upper and lower back radiating out towards the sides and down both legs.  He has been incarcerated for the last 2 months between admissions.  On his admitting CT scan, he was found to have a moderate right inguinal hernia containing a thick walled loops of terminal ileum with also destructive changes at T8-T9.  MRI of the thoracic spine confirmed acute osteomyelitis discitis at T8-T9 associated with an epidural abscess.  He underwent a posterior laminectomy with evacuation of the epidural abscess by Dr. Myer Haff on 03/24/2022.  Due to social issues, he has been kept in the hospital for IV antibiotics for the duration of this course.  During his hospital stay, he was complaining of some increased pain in his right groin from his right inguinal hernia.  He had a repeat CT scan on 03/31/2022 which again showed a persistent right inguinal hernia containing small bowel however there was no further evidence of bowel inflammation at the time.  There is no evidence of bowel obstruction either as contrast ingested was flowing through the bowel contained within the hernia and out of it as well.  The  patient reports that over the last few months he is noted his hernia has been progressively getting worse.  He reports that when he walks it always bulges out and causes some discomfort.  He feels that this also causes some swelling of the adjacent penis.  Denies any troubles with the left groin although on CT scan, he also has a much smaller fat-containing left inguinal hernia.  He also has an umbilical hernia.  Past Medical History: Past Medical History:  Diagnosis Date   Bacteremia    Endocarditis    Hyperlipidemia    Osteomyelitis of thoracic spine (HCC)      Past Surgical History: Past Surgical History:  Procedure Laterality Date   POSTERIOR LAMINECTOMY THORACIC SPINE      Home Medications: Prior to Admission medications   Not on File    Allergies: No Known Allergies  Social History:  reports that he has been smoking cigarettes. He has a 15.00 pack-year smoking history. He has never used smokeless tobacco. He reports current drug use. Drugs: IV and Methamphetamines. He reports that he does not drink alcohol.   Family History: History reviewed. No pertinent family history.  Review of Systems: Review of Systems  Constitutional:  Negative for chills and fever.  Respiratory:  Negative for shortness of breath.   Cardiovascular:  Negative for chest pain.  Gastrointestinal:  Positive for abdominal pain (right groin). Negative for nausea and vomiting.  Genitourinary:  Negative for dysuria.  Musculoskeletal:  Negative for myalgias.    Physical Exam BP (!) 85/64 (BP Location: Left Arm)  Pulse 88   Temp 97.9 F (36.6 C)   Resp 18   Ht 6\' 4"  (1.93 m)   Wt 88.5 kg   SpO2 98%   BMI 23.75 kg/m  CONSTITUTIONAL: No acute distress HEENT:  Normocephalic, atraumatic, extraocular motion intact. NECK: Trachea is midline, and there is no jugular venous distension. RESPIRATORY:  Normal respiratory effort without pathologic use of accessory muscles. CARDIOVASCULAR: Regular rhythm  and rate. GI: The abdomen is soft, nondistended, with some discomfort to palpation in the right groin.  He has a right inguinal hernia containing small bowel which is reducible at bedside.  Left groin has a much smaller inguinal hernia.  Patient also has a reducible umbilical hernia.   MUSCULOSKELETAL:  Normal muscle strength and tone in all four extremities.  No peripheral edema or cyanosis. SKIN: Skin turgor is normal. There are no pathologic skin lesions.  NEUROLOGIC:  Motor and sensation is grossly normal.  Cranial nerves are grossly intact. PSYCH:  Alert and oriented to person, place and time. Affect is normal.  Laboratory Analysis: Results for orders placed or performed during the hospital encounter of 03/15/22 (from the past 24 hour(s))  CBC     Status: Abnormal   Collection Time: 04/02/22  6:45 AM  Result Value Ref Range   WBC 2.9 (L) 4.0 - 10.5 K/uL   RBC 2.85 (L) 4.22 - 5.81 MIL/uL   Hemoglobin 7.2 (L) 13.0 - 17.0 g/dL   HCT 04/04/22 (L) 93.7 - 90.2 %   MCV 85.3 80.0 - 100.0 fL   MCH 25.3 (L) 26.0 - 34.0 pg   MCHC 29.6 (L) 30.0 - 36.0 g/dL   RDW 40.9 (H) 73.5 - 32.9 %   Platelets 325 150 - 400 K/uL   nRBC 0.0 0.0 - 0.2 %  Hepatic function panel     Status: Abnormal   Collection Time: 04/02/22  6:45 AM  Result Value Ref Range   Total Protein 5.9 (L) 6.5 - 8.1 g/dL   Albumin 2.2 (L) 3.5 - 5.0 g/dL   AST 04/04/22 (H) 15 - 41 U/L   ALT 146 (H) 0 - 44 U/L   Alkaline Phosphatase 149 (H) 38 - 126 U/L   Total Bilirubin 0.9 0.3 - 1.2 mg/dL   Bilirubin, Direct 0.2 0.0 - 0.2 mg/dL   Indirect Bilirubin 0.7 0.3 - 0.9 mg/dL    Imaging: CT abdomen/pelvis on 03/31/2022: IMPRESSION: 1. Persistent large right inguinal hernia containing small bowel. However, there is no evidence for a bowel obstruction and no acute bowel inflammation at this time. 2. Interval surgical fixation in the lower thoracic spine with decreased inflammatory changes around the T8-T9 discitis/osteomyelitis. 3. Volume  loss with air bronchograms at both lung bases. Findings could be associated with atelectasis. Difficult to exclude infection at the lung bases. 4. Nonobstructive bilateral renal calculi. 5.  Aortic Atherosclerosis (ICD10-I70.0). 6. Small left inguinal hernia containing fat. Small ventral hernias containing fat. 7. Diffuse subcutaneous edema.  Assessment and Plan: This is a 51 y.o. male with bilateral inguinal hernias and umbilical hernia.  - The patient's right inguinal hernia is reducible and discussed with the patient the findings on his 2 CT scans from his admission and 2 days ago.  The patient's right inguinal hernia contains small bowel which on the most recent CT scan, does not show any evidence of inflammatory changes and there is no evidence of obstruction either.  Contrast ingested fluids through the hernia without complications.  At this point, given the patient's  current medical issues with his thoracic osteomyelitis and epidural abscess, I think would be best to wait until this is fully resolved prior to going into the operating room and doing further surgery.  Discussed with him that there was also help [place] prevent any infection of the mesh or contamination. - In the meantime, the patient can use a hernia truss or underwear in order to put some pressure on the right groin to prevent discomfort with the herniation. - Patient can follow-up with me as an outpatient once his treatment course is completed in order to schedule him for surgery.  I spent 45 minutes dedicated to the care of this patient on the date of this encounter to include pre-visit review of records, face-to-face time with the patient discussing diagnosis and management, and any post-visit coordination of care.   Howie Ill, MD [facility]:  [phone]

## 2022-04-03 DIAGNOSIS — Z981 Arthrodesis status: Secondary | ICD-10-CM

## 2022-04-03 DIAGNOSIS — M4624 Osteomyelitis of vertebra, thoracic region: Secondary | ICD-10-CM | POA: Diagnosis not present

## 2022-04-03 DIAGNOSIS — G062 Extradural and subdural abscess, unspecified: Secondary | ICD-10-CM | POA: Diagnosis not present

## 2022-04-03 DIAGNOSIS — A4901 Methicillin susceptible Staphylococcus aureus infection, unspecified site: Secondary | ICD-10-CM

## 2022-04-03 LAB — CBC
HCT: 27.1 % — ABNORMAL LOW (ref 39.0–52.0)
Hemoglobin: 8.1 g/dL — ABNORMAL LOW (ref 13.0–17.0)
MCH: 25.5 pg — ABNORMAL LOW (ref 26.0–34.0)
MCHC: 29.9 g/dL — ABNORMAL LOW (ref 30.0–36.0)
MCV: 85.2 fL (ref 80.0–100.0)
Platelets: 346 10*3/uL (ref 150–400)
RBC: 3.18 MIL/uL — ABNORMAL LOW (ref 4.22–5.81)
RDW: 19.3 % — ABNORMAL HIGH (ref 11.5–15.5)
WBC: 2.9 10*3/uL — ABNORMAL LOW (ref 4.0–10.5)
nRBC: 0 % (ref 0.0–0.2)

## 2022-04-03 LAB — COMPREHENSIVE METABOLIC PANEL
ALT: 164 U/L — ABNORMAL HIGH (ref 0–44)
AST: 227 U/L — ABNORMAL HIGH (ref 15–41)
Albumin: 2.3 g/dL — ABNORMAL LOW (ref 3.5–5.0)
Alkaline Phosphatase: 160 U/L — ABNORMAL HIGH (ref 38–126)
Anion gap: 6 (ref 5–15)
BUN: 17 mg/dL (ref 6–20)
CO2: 27 mmol/L (ref 22–32)
Calcium: 8.9 mg/dL (ref 8.9–10.3)
Chloride: 108 mmol/L (ref 98–111)
Creatinine, Ser: 0.95 mg/dL (ref 0.61–1.24)
GFR, Estimated: >60 mL/min (ref >60)
Glucose, Bld: 109 mg/dL — ABNORMAL HIGH (ref 70–99)
Potassium: 5 mmol/L (ref 3.5–5.1)
Sodium: 141 mmol/L (ref 135–145)
Total Bilirubin: 1 mg/dL (ref 0.3–1.2)
Total Protein: 6.1 g/dL — ABNORMAL LOW (ref 6.5–8.1)

## 2022-04-03 LAB — HEPATITIS B DNA, ULTRAQUANTITATIVE, PCR
HBV DNA SERPL PCR-ACNC: >1000000000 IU/mL
HBV DNA SERPL PCR-LOG IU: UNDETERMINED log10 IU/mL

## 2022-04-03 NOTE — Consult Note (Signed)
Consultation  Referring Provider:   Hospitalist   Admit date: 03/15/2022 Consult date: 04/03/2022         Reason for Consultation:  Acute Hepatitis B            HPI:   Derek Dunn is a 51 y.o. gentleman with MSSA osteomyelitis and GI has been consulted for acute hepatitis B infection. Patient last used IV drugs about 3 months ago and was incarcerated recently. No unprotected sex. He has known about hepatitis C infection for a long time but based on last Hep C pcr, he has cleared this virus. No known liver disease and no biochemical or imaging evidence of cirrhosis. Patient diagnosed with acute hepatitis B with positive Hep B core antibody IgM and positive hepatitis B surface antigen. No encephalopathy.   Past Medical History:  Diagnosis Date   Bacteremia    Endocarditis    Hyperlipidemia    Osteomyelitis of thoracic spine (HCC)     Past Surgical History:  Procedure Laterality Date   POSTERIOR LAMINECTOMY THORACIC SPINE      History reviewed. No pertinent family history.   Social History   Tobacco Use   Smoking status: Every Day    Packs/day: 1.00    Years: 15.00    Total pack years: 15.00    Types: Cigarettes   Smokeless tobacco: Never  Vaping Use   Vaping Use: Never used  Substance Use Topics   Alcohol use: No   Drug use: Yes    Types: IV, Methamphetamines    Comment: suboxone, last heroin use 3 days ago    Prior to Admission medications   Not on File    Current Facility-Administered Medications  Medication Dose Route Frequency Provider Last Rate Last Admin   acetaminophen (TYLENOL) tablet 650 mg  650 mg Oral Q6H PRN Esaw Grandchild A, DO       bisacodyl (DULCOLAX) EC tablet 5 mg  5 mg Oral Daily PRN Venetia Night, MD   5 mg at 03/19/22 0958   calamine lotion 1 Application  1 Application Topical BID Lynn Ito, MD   1 Application at 04/03/22 0849   ceFAZolin (ANCEF) IVPB 2g/100 mL premix  2 g Intravenous Q8H Ravishankar, Rhodia Albright, MD 200  mL/hr at 04/03/22 1436 2 g at 04/03/22 1436   Chlorhexidine Gluconate Cloth 2 % PADS 6 each  6 each Topical Daily Venetia Night, MD   6 each at 04/03/22 0849   clobetasol cream (TEMOVATE) 0.05 %   Topical BID Lynn Ito, MD   Given at 04/03/22 0849   enoxaparin (LOVENOX) injection 40 mg  40 mg Subcutaneous Q24H Lewie Chamber, MD   40 mg at 04/03/22 1101   ferrous sulfate tablet 325 mg  325 mg Oral Q breakfast Esaw Grandchild A, DO   325 mg at 04/03/22 0848   gabapentin (NEURONTIN) capsule 900 mg  900 mg Oral TID Venetia Night, MD   900 mg at 04/03/22 1514   HYDROmorphone (DILAUDID) injection 1 mg  1 mg Intravenous Q2H PRN Manuela Schwartz, NP   1 mg at 04/03/22 1651   lactulose (CHRONULAC) 10 GM/15ML solution 20 g  20 g Oral BID PRN Venetia Night, MD       methocarbamol (ROBAXIN) tablet 1,000 mg  1,000 mg Oral Q6H PRN Venetia Night, MD   1,000 mg at 04/03/22 0913   mupirocin ointment (BACTROBAN) 2 %   Nasal BID Venetia Night, MD   Given at 04/03/22 0847   ondansetron (ZOFRAN) tablet 4  mg  4 mg Oral Q6H PRN Venetia Night, MD       Or   ondansetron [facility]) injection 4 mg  4 mg Intravenous Q6H PRN Venetia Night, MD       Oral care mouth rinse  15 mL Mouth Rinse PRN Venetia Night, MD       oxyCODONE (Oxy IR/ROXICODONE) immediate release tablet 10-15 mg  10-15 mg Oral Q4H PRN Venetia Night, MD   15 mg at 04/03/22 1514   polyethylene glycol (MIRALAX / GLYCOLAX) packet 17 g  17 g Oral Daily Venetia Night, MD   17 g at 04/03/22 0848   senna-docusate (Senokot-S) tablet 1 tablet  1 tablet Oral BID Venetia Night, MD   1 tablet at 04/03/22 0848   sorbitol 70 % solution 30 mL  30 mL Oral Daily Venetia Night, MD   30 mL at 04/03/22 0847   traZODone (DESYREL) tablet 100 mg  100 mg Oral Axel Filler, MD   100 mg at 04/02/22 2048    Allergies as of 03/14/2022   (No Known Allergies)     Review of Systems:    All systems reviewed  and negative except where noted in HPI.  Review of Systems  Constitutional:  Positive for chills, fever and malaise/fatigue.  Respiratory:  Negative for shortness of breath.   Gastrointestinal:  Negative for abdominal pain, blood in stool and vomiting.  Musculoskeletal:  Positive for back pain.  Skin:  Negative for rash.  Neurological:  Negative for focal weakness.  Psychiatric/Behavioral:  Positive for substance abuse.   All other systems reviewed and are negative.     Physical Exam:  Vital signs in last 24 hours: Temp:  [98.5 F (36.9 C)-99.9 F (37.7 C)] 99.9 F (37.7 C) (07/31 1547) Pulse Rate:  [69-90] 90 (07/31 1547) Resp:  [16-18] 17 (07/31 1547) BP: (98-108)/(62-73) 108/73 (07/31 1547) SpO2:  [95 %-100 %] 95 % (07/31 1547) Last BM Date : 04/01/22 General:   Pleasant in NAD Head:  Normocephalic and atraumatic. Eyes:   No icterus.   Conjunctiva pink. Mouth: Mucosa pink moist, no lesions. Lungs:  No respiratory distress Heart:  RRR Abdomen:   Flat, soft, nondistended, nontender Msk:  MAEW x4, No clubbing or cyanosis. Strength 5/5. Symmetrical without gross deformities. Neurologic:  Alert and  oriented x4;  Cranial nerves II-XII intact.  Skin:  Warm, dry, pink without significant lesions or rashes. Psych:  Alert and cooperative. Normal affect.  LAB RESULTS: Recent Labs    04/01/22 0421 04/02/22 0645 04/03/22 0528  WBC 3.6* 2.9* 2.9*  HGB 7.6* 7.2* 8.1*  HCT 25.1* 24.3* 27.1*  PLT 357 325 346   BMET Recent Labs    04/03/22 0528  NA 141  K 5.0  CL 108  CO2 27  GLUCOSE 109*  BUN 17  CREATININE 0.95  CALCIUM 8.9   LFT Recent Labs    04/02/22 0645 04/03/22 0528  PROT 5.9* 6.1*  ALBUMIN 2.2* 2.3*  AST 192* 227*  ALT 146* 164*  ALKPHOS 149* 160*  BILITOT 0.9 1.0  BILIDIR 0.2  --   IBILI 0.7  --    PT/INR No results for input(s): "LABPROT", "INR" in the last 72 hours.  STUDIES: No results found.     Impression / Plan:   51 y/o gentleman  with history of IVDU with MSSA osteomyelitis and acute hepatitis B. Given no evidence of liver failure there is no current indication for treating hepatitis B. No evidence of cirrhosis.  Greater than 95% of the time there is spontaneous clearance of hepatitis B and only 5% of the time it becomes chronic. There is a < 1% chance of acute hepatic failure from hepatitis B  - continue to monitor liver enzymes and check daily INR - f/u on ordered Hep B labs particularly Hep B e antigen - f/u on HCV RNA to rule out re-infection, this was negative previously indicating spontaneous clearance - continue supportive care - will need outpatient f/u to document clearance  Will continue to follow, please call with any questions or concerns.  Merlyn Lot MD, MPH [facility] GI

## 2022-04-03 NOTE — Progress Notes (Signed)
Progress Note   Patient: Derek Dunn EAV:[id number] DOB: 1971/04/03 DOA: 03/15/2022     19 DOS: the patient was seen and examined on 04/03/2022   Brief hospital course: Derek Dunn is a 51 year old male with PMH polysubstance abuse including IV drug use in the past, housing insecurity (incarcerated past 2 months, just released (as of 7/12), hospital admission in May 2023 with MSSA bacteremia secondary to spinal osteomyelitis and epidural abscess, recurrent right inguinal hernia, history of signing out AMA.  He presented to the ED from [facility] on 03/15/2022 with complaints of worsening back pain and recurrent right groin pain.  No reported fevers.  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  Labs were mostly unremarkable including no leukocytosis and normal lactic acid.  Patient was started on broad-spectrum IV antibiotic coverage and admitted for further evaluation and management.  Interventional radiology was consulted for abscess aspiration for cultures, performed on 7/12. Infectious disease was also consulted.   Neurosurgery consulted for consideration of washout in the OR, given poor likelihood of full resolution with antibiotics alone. He was taken to the OR for washout on 03/24/22.  Underwent posterior laminectomy T8-9 for evacuation of epidural abscess, posterolateral arthrodesis T7 to T12 with T7-12 posterior instrumentation.    Consultants: Neurosurgery Infectious Disease    Assessment and Plan: * Epidural abscess Acute osteomyelitis discitis and facet joint septic arthritis at T8-9 with epidural abscess. Severe spinal stenosis at T8-T9. Acute septic arthritis at the left L5-S1 facet. History of IV drug use. Prior history MSSA bacteremia. --Initially on IV Cefepime, Vancomycin -- IR consulted, performed CT-guided aspiration for culture  on 7/12 --Infectious disease consulted  --Was on nafcillin continuous IV infusion --Antibiotic changed to cefazolin on 7/24 due to rising LFTs --Neurosurgery consulted.  Patient underwent T7-12 posterior instrumentation with decompression and washout of epidural abscess at T8-9 (performed on 03/24/22) -- Drain removed on 03/27/2022  -- Wound VAC removed 26 --Neurosurgery recommends: Mobilize, pain control, resume DVT prophylaxis, PT/OT -- Follow-up with neurosurgery in 2 weeks  7/28: per Neurosurgery, pt no longer needs dressing over incision.  May shower and pat incision area dry.  Septic arthritis (HCC) Signs of septic arthritis at level of L5-S1 seen on MRI lumbar spine.  Management as outlined, see epidural abscess.  Discitis thoracic region See epidural abscess for plan  Back pain Secondary to spinal infection and now post op pain as expected - continue pain regimen and abx   Vertebral osteomyelitis (HCC) Management as outlined  Right inguinal hernia S/p reduction in the ED.   --Pain control as needed --Monitor closely for signs of bowel obstruction or incarceration --Surgical consult if recurrent - continue laxative regimen -repeat CT abd/pelvis today given worsening discomfort per patient and fevers of unclear source  Fever Temp 100.4 noted on evening of 7/26. Continues to spike low grade fevers since. 7/27: Negative for Covid-19, Flu A/B 7/28: Surgical incision without signs of infection (see image), repeat CT abd/pelvis no acute findings and hernia appeared improved vs prior scan 7/29: Noted HBV and HCV reactive, confirmatory labs pending  --Follow pending respiratory viral panel --Follow pending sputum culture  --Repeat blood cx's from 7/28 negative to date --Remains on Ancef  --Monitor CBC, fever curve --Monitor clinically for s/sx's of infection --Await confirmatory HBV and HCV quant's & genotypes --ID following  Iron deficiency Mild.  Start oral iron  supplement.  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  Pt reports being told he had hepatitis C in the past.   --HCV quant and genotype pending --ID following  Acute hepatitis B Due to rising LFT's hepatitis screening done and resulted with reactive HBVsAg and HBV Ab IgM are reactive, viral load > 1 billion --Consulted GI --ID is also following --Monitor LFT's  Normocytic anemia Hbg 11.3 on admission, had down-trended since admission likely due to sepsis/infection and some blood loss in surgery. Hbg stable in upper 7's past several days and no evidence of bleeding --check anemia panel with AM labs --further evaluation as needed --monitor CBC   Transaminitis LFT's on 7/24 noted to have trended up. Nafcillin infusion changed to Ancef per ID. Stop scheduled Tylenol, use PRN for fevers. --Follow LFT's  Insomnia Trazodone at bedtime ordered.  Hypotension BP as low as 87/61 on AM of 7/13, suspect due to pain medications.  Otherwise no signs of sepsis. --Maintain MAP > 65 with fluids if needed --Monitor closely        Subjective: Patient awake resting in bed when seen today.  He had been up ambulating around the unit earlier.  Reports another fever overnight.  Feels okay today.   Physical Exam: Vitals:   04/02/22 1936 04/03/22 0606 04/03/22 0846 04/03/22 1547  BP: 1_0 108/73  Pulse: 90 69 71 90  Resp: _1 Temp: 98.5 F (36.9 C) 98.7 F (37.1 C) 98.7 F (37.1 C) 99.9 F (37.7 C)  TempSrc:   Oral   SpO2: 100% 95% 97% 95%  Weight:      Height:       General exam: awake sitting up in bed, alert, no acute distress Respiratory system: normal respiratory effort at rest, on room air Cardiovascular system: RRR, no peripheral edema.   Central nervous system: A&O x3. no gross focal neurologic deficits, normal speech Extremities: moves all, normal tone Gastrointestinal system: soft non-tender abdomen Psychiatry: normal  mood, congruent affect   Data Reviewed:  Notable labs --- Hep B viral load > 1 billion.  Glucose 109, Alk phos 160 (rising), AST 160 (rising), ALT 164 (rising), Tprotein 6.1.  Hbg 8.1 up from 7.2, WBC 2.9 stable.  Family Communication: will attempt to update pt's aunt Langley Gauss by phone this afternoon [phone]   Disposition: Status is: Inpatient Remains inpatient appropriate because: On IV antibiotics and still being followed by neurosurgery, pain uncontrolled.  Unsafe to discharge with PICC line for IV antibiotics due to his history of IVDU, needs to remain in hospital for course.   Planned Discharge Destination: Home    Time spent: 35 minutes  Author: Ezekiel Slocumb, DO 04/03/2022 4:16 PM  For on call review [email or web address].

## 2022-04-03 NOTE — Progress Notes (Signed)
Date of Admission:  03/15/2022   T  ID: Derek Dunn is a 51 y.o. male  Principal Problem:   Epidural abscess Active Problems:   Fever   Vertebral osteomyelitis (HCC)   Right inguinal hernia   Back pain   Discitis thoracic region   Septic arthritis (HCC)   Hypotension   Insomnia   Transaminitis   Normocytic anemia   Acute hepatitis B   Hepatitis C antibody test positive   Iron deficiency   Umbilical hernia without obstruction and without gangrene   Left inguinal hernia    Subjective: Fever present Minimal cough  Medications:   calamine  1 Application Topical BID   Chlorhexidine Gluconate Cloth  6 each Topical Daily   clobetasol cream   Topical BID   enoxaparin (LOVENOX) injection  40 mg Subcutaneous Q24H   ferrous sulfate  325 mg Oral Q breakfast   gabapentin  900 mg Oral TID   mupirocin ointment   Nasal BID   polyethylene glycol  17 g Oral Daily   senna-docusate  1 tablet Oral BID   sorbitol  30 mL Oral Daily   traZODone  100 mg Oral QHS    Objective: Vital signs in last 24 hours: Patient Vitals for the past 24 hrs:  BP Temp Temp src Pulse Resp SpO2  04/03/22 0846 99/66 98.7 F (37.1 C) Oral 71 18 97 %  04/03/22 0606 98/62 98.7 F (37.1 C) -- 69 18 95 %  04/02/22 1936 107/68 98.5 F (36.9 C) -- 90 16 100 %  04/02/22 1600 95/62 98.2 F (36.8 C) -- 86 18 99 %      PHYSICAL EXAM:  General: Alert, cooperative, no distress, appears stated age.  Head: Normocephalic, without obvious abnormality, atraumatic. Eyes: Conjunctivae clear, anicteric sclerae. Pupils are equal ENT Nares normal. No drainage or sinus tenderness. Lips, mucosa, and tongue normal. No Thrush Neck: Supple, symmetrical, no adenopathy, thyroid: non tender no carotid bruit and no JVD. Back: surgcial site     Lungs: b/l air entry Decreased bases Heart: Regular rate and rhythm, no murmur, rub or gallop. Abdomen: Soft, non-tender,not distended. Bowel sounds normal. No masses Rt  groin hernia  Extremities: atraumatic, no cyanosis. No edema. No clubbing Skin: Nas above Lymph: Cervical, supraclavicular normal. Neurologic: Grossly non-focal  Lab Results Recent Labs    04/02/22 0645 04/03/22 0528  WBC 2.9* 2.9*  HGB 7.2* 8.1*  HCT 24.3* 27.1*  NA  --  141  K  --  5.0  CL  --  108  CO2  --  27  BUN  --  17  CREATININE  --  0.95   Liver Panel Recent Labs    04/01/22 0421 04/02/22 0645 04/03/22 0528  PROT 6.0* 5.9* 6.1*  ALBUMIN 2.3* 2.2* 2.3*  AST 161* 192* 227*  ALT 132* 146* 164*  ALKPHOS 128* 149* 160*  BILITOT 1.0 0.9 1.0  BILIDIR 0.2 0.2  --   IBILI 0.8 0.7  --   Microbiology:    Assessment/Plan: MSSA thoracic vertebrae infection- s/p fusion Now on cefazolin'for 6 weeks  - will not add rifampin because of acute hep B infection and avoid any hepatotoxic drug  Fever low grade- very likely due to acute hepatitis B  Transamintis, fever due to acute hepatitis B infection > 1 billion Virus E antigen pending Leucopenia could be due to HEPb infection  Anemia  Seen by GI- no intervention currently Watch for spontaneous clearance  HEPC - spontaneous clearance  Left hand eczema-  steroid cream   Discussed the management with patient and care team

## 2022-04-03 NOTE — Progress Notes (Signed)
Physical Therapy Treatment Patient Details Name: Derek Dunn MRN: [id number] DOB: 09/02/1971 Today's Date: 04/03/2022   History of Present Illness Pt is a 51 male that presented [facility] ED [facility] [place] [facility] 7[facility] with complaints of worsening back pain and recurrent right groin pain. 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 PMH of polysubstance abuse including IV drug use in the past, housing insecurity. Pt with washout on 7/21 with continue upon transfer orders. PT treatment/assessment performed this date.    PT Comments    Pt seen for PT tx with pt agreeable but endorsing 7/10 back pain -- nurse notified of pt's request for pain meds. Pt completes bed mobility & STS with mod I, ambulates 2 laps around nurses station without AD, & negotiates 7 steps with 1 rail with mod I. Pt reports he has been ambulating daily & will use RW PRN for pain management only. At this time, pt is independent with functional mobility & doesn't require further acute PT services, pt in agreement. Notified team of pt's d/c from PT caseload.    Recommendations for follow up therapy are one component of a multi-disciplinary discharge planning process, led by the attending physician.  Recommendations may be updated based on patient status, additional functional criteria and insurance authorization.  Follow Up Recommendations  No PT follow up Can patient physically be transported by private vehicle: Yes   Assistance Recommended at Discharge PRN  Patient can return home with the following Assistance with cooking/housework   Equipment Recommendations  None recommended by PT    Recommendations for Other Services       Precautions / Restrictions Precautions Precautions: None Restrictions Weight Bearing Restrictions: No     Mobility  Bed Mobility Overal  bed mobility: Modified Independent       Supine to sit: Modified independent (Device/Increase time)   Sit to sidelying: Modified independent (Device/Increase time) Derek bed mobility comments: supine<>sit with HOB elevated, extra time 2/2 back pain    Transfers Overall transfer level: Modified independent Equipment used: None Transfers: Sit to/from Stand Sit to Stand: Modified independent (Device/Increase time)                Ambulation/Gait Ambulation/Gait assistance: Modified independent (Device/Increase time) Gait Distance (Feet): 355 Feet Assistive device: None Gait Pattern/deviations: WFL(Within Functional Limits) Gait velocity: WNL         Stairs Stairs: Yes Stairs assistance: Modified independent (Device/Increase time) Stair Management: One rail Right Number of Stairs: 7 Derek stair comments: Reciprocal pattern to ascend, step to (leading with LLE) but progressing to step over step to descend stairs.   Wheelchair Mobility    Modified Rankin (Stroke Patients Only)       Balance Overall balance assessment: Modified Independent Sitting-balance support: Feet supported Sitting balance-Leahy Scale: Normal     Standing balance support: During functional activity, No upper extremity supported Standing balance-Leahy Scale: Good                              Cognition Arousal/Alertness: Awake/alert Behavior During Therapy: WFL for tasks assessed/performed Overall Cognitive Status: Within Functional Limits for tasks assessed                                            Exercises      Derek Comments Derek comments (skin integrity, edema, etc.): SpO2 93-96%, HR 101 bpm at end of session      Pertinent Vitals/Pain Pain Assessment Pain Assessment: 0-10 Pain Score: 7  Pain Location: back Pain Descriptors / Indicators: Discomfort Pain Intervention(s): Monitored during session, Patient requesting pain meds-RN notified     Home Living                          Prior Function            PT Goals (current goals can now be found in the care plan section) Acute Rehab PT Goals Patient Stated Goal: to d/c to his grandmother's house PT Goal Formulation: With patient Time For Goal Achievement: 04/05/22 Potential to Achieve Goals: Good Progress towards PT goals: Goals met/education completed, patient discharged from PT    Frequency           PT Plan Discharge plan needs to be updated    Co-evaluation              AM-PAC PT "6 Clicks" Mobility   Outcome Measure  Help needed turning from your back to your side while in a flat bed without using bedrails?: None Help needed moving from lying on your back to sitting on the side of a flat bed without using bedrails?: None Help needed moving to and from a bed to a chair (including a wheelchair)?: None Help needed standing up from a chair using your arms (e.g., wheelchair or bedside chair)?: None Help needed to walk in hospital room?: None Help needed climbing 3-5 steps with a railing? : None 6 Click Score: 24    End of Session   Activity Tolerance: Patient tolerated treatment well Patient left: in bed;with call bell/phone within reach Nurse Communication: Mobility status       Time: 0488-8916 PT Time Calculation (min) (ACUTE ONLY): 8 min  Charges:  $Therapeutic Activity: 8-22 mins                     Lavone Nian, PT, DPT 04/03/22, 12:30 PM   Waunita Schooner 04/03/2022, 12:28 PM

## 2022-04-04 DIAGNOSIS — G062 Extradural and subdural abscess, unspecified: Secondary | ICD-10-CM | POA: Diagnosis not present

## 2022-04-04 LAB — TROPONIN I (HIGH SENSITIVITY): Troponin I (High Sensitivity): 8 ng/L (ref <18)

## 2022-04-04 LAB — CBC
HCT: 25.6 % — ABNORMAL LOW (ref 39.0–52.0)
Hemoglobin: 7.7 g/dL — ABNORMAL LOW (ref 13.0–17.0)
MCH: 25.5 pg — ABNORMAL LOW (ref 26.0–34.0)
MCHC: 30.1 g/dL (ref 30.0–36.0)
MCV: 84.8 fL (ref 80.0–100.0)
Platelets: 353 10*3/uL (ref 150–400)
RBC: 3.02 MIL/uL — ABNORMAL LOW (ref 4.22–5.81)
RDW: 19.4 % — ABNORMAL HIGH (ref 11.5–15.5)
WBC: 3.1 10*3/uL — ABNORMAL LOW (ref 4.0–10.5)
nRBC: 0 % (ref 0.0–0.2)

## 2022-04-04 LAB — CULTURE, RESPIRATORY W GRAM STAIN: Gram Stain: NONE SEEN

## 2022-04-04 LAB — PROTIME-INR
INR: 1.1 (ref 0.8–1.2)
Prothrombin Time: 14.2 seconds (ref 11.4–15.2)

## 2022-04-04 LAB — HEPATIC FUNCTION PANEL
ALT: 150 U/L — ABNORMAL HIGH (ref 0–44)
AST: 206 U/L — ABNORMAL HIGH (ref 15–41)
Albumin: 2.3 g/dL — ABNORMAL LOW (ref 3.5–5.0)
Alkaline Phosphatase: 173 U/L — ABNORMAL HIGH (ref 38–126)
Bilirubin, Direct: 0.2 mg/dL (ref 0.0–0.2)
Indirect Bilirubin: 1.1 mg/dL — ABNORMAL HIGH (ref 0.3–0.9)
Total Bilirubin: 1.3 mg/dL — ABNORMAL HIGH (ref 0.3–1.2)
Total Protein: 6 g/dL — ABNORMAL LOW (ref 6.5–8.1)

## 2022-04-04 LAB — D-DIMER, QUANTITATIVE: D-Dimer, Quant: 2.88 ug/mL-FEU — ABNORMAL HIGH (ref 0.00–0.50)

## 2022-04-04 NOTE — Progress Notes (Signed)
Contacted pt regarding Hep B acute infection. Education provided and control measures reviewed with pt. Contact investigation complete. Encouraged f/u in for getting Hep B bloodwork rechecked. Advised if no PCP, [facility] for [facility].States he does not have a PCP,homeless and no ins. Provided with resource list for healthcare providers.

## 2022-04-04 NOTE — TOC Progression Note (Signed)
Transition of Care [facility]) - Progression Note    Patient Details  Name: Derek Dunn MRN: [id number] Date of Birth: [birth year]  Transition of Care [facility]) CM/SW Contact  Caryn Section, RN Phone Number: 04/04/2022, 11:34 AM  Clinical Narrative:   Continuing with IV medications, no EDD at this time.  TOC to follow for needs.         Expected Discharge Plan and Services                                                 Social Determinants of Health (SDOH) Interventions    Readmission Risk Interventions     No data to display

## 2022-04-04 NOTE — Progress Notes (Signed)
Progress Note   Patient: Derek Dunn YBW:[id number] DOB: 16-May-1971 DOA: 03/15/2022     20 DOS: the patient was seen and examined on 04/04/2022   Brief hospital course: Derek Dunn is a 51 year old male with PMH polysubstance abuse including IV drug use in the past, housing insecurity (incarcerated past 2 months, just released (as of 7/12), hospital admission in May 2023 with MSSA bacteremia secondary to spinal osteomyelitis and epidural abscess, recurrent right inguinal hernia, history of signing out AMA.  He presented to the ED from [facility] on 03/15/2022 with complaints of worsening back pain and recurrent right groin pain.  No reported fevers.  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  Labs were mostly unremarkable including no leukocytosis and normal lactic acid.  Patient was started on broad-spectrum IV antibiotic coverage and admitted for further evaluation and management.  Interventional radiology was consulted for abscess aspiration for cultures, performed on 7/12. Infectious disease was also consulted.   Neurosurgery consulted for consideration of washout in the OR, given poor likelihood of full resolution with antibiotics alone. He was taken to the OR for washout on 03/24/22.  Underwent posterior laminectomy T8-9 for evacuation of epidural abscess, posterolateral arthrodesis T7 to T12 with T7-12 posterior instrumentation.    Consultants: Neurosurgery Infectious Disease    Assessment and Plan: * Epidural abscess Acute osteomyelitis discitis and facet joint septic arthritis at T8-9 with epidural abscess. Severe spinal stenosis at T8-T9. Acute septic arthritis at the left L5-S1 facet. History of IV drug use. Prior history MSSA bacteremia. --Initially on IV Cefepime, Vancomycin -- IR consulted, performed CT-guided aspiration for culture  on 7/12 --Infectious disease consulted  --Was on nafcillin continuous IV infusion --Antibiotic changed to cefazolin on 7/24 due to rising LFTs --Neurosurgery consulted.  Patient underwent T7-12 posterior instrumentation with decompression and washout of epidural abscess at T8-9 (performed on 03/24/22) -- Drain removed on 03/27/2022  -- Wound VAC removed 26 --Neurosurgery recommends: Mobilize, pain control, resume DVT prophylaxis, PT/OT -- Follow-up with neurosurgery in 2 weeks  7/28: per Neurosurgery, pt no longer needs dressing over incision.  May shower and pat incision area dry.  Septic arthritis (HCC) Signs of septic arthritis at level of L5-S1 seen on MRI lumbar spine.  Management as outlined, see epidural abscess.  Discitis thoracic region See epidural abscess for plan  Back pain Secondary to spinal infection and now post op pain as expected - continue pain regimen and abx   Vertebral osteomyelitis (HCC) Management as outlined  Right inguinal hernia S/p reduction in the ED.   --Pain control as needed --Monitor closely for signs of bowel obstruction or incarceration --Surgical consult if recurrent - continue laxative regimen -repeat CT abd/pelvis today given worsening discomfort per patient and fevers of unclear source  Fever Temp 100.4 noted on evening of 7/26. Continues to spike low grade fevers since. 7/27: Negative for Covid-19, Flu A/B 7/28: Surgical incision without signs of infection (see image), repeat CT abd/pelvis no acute findings and hernia appeared improved vs prior scan 7/29: Noted HBV and HCV reactive, confirmatory labs pending  --Follow pending respiratory viral panel --Follow pending sputum culture  --Repeat blood cx's from 7/28 negative to date --Remains on Ancef  --Monitor CBC, fever curve --Monitor clinically for s/sx's of infection --Await confirmatory HBV and HCV quant's & genotypes --ID following  Iron deficiency Mild.  Start oral iron  supplement.  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  Pt reports being told he had hepatitis C in the past.   --HCV quant and genotype pending --ID following  Acute hepatitis B Due to rising LFT's hepatitis screening done and resulted with reactive HBVsAg and HBV Ab IgM are reactive, viral load > 1 billion --Consulted GI, no treatment indicated --Outpatient GI follow up to document clearance --Follow up pending HepB eAg --ID is also following --Monitor LFT's, INR  Normocytic anemia Hbg 11.3 on admission, had down-trended since admission likely due to sepsis/infection and some blood loss in surgery. Hbg stable in upper 7's past several days and no evidence of bleeding --check anemia panel with AM labs --further evaluation as needed --monitor CBC   Transaminitis LFT's on 7/24 noted to have trended up. Nafcillin infusion changed to Ancef per ID. Stop scheduled Tylenol, use PRN for fevers. --Follow LFT's  Insomnia Trazodone at bedtime ordered.  Hypotension BP as low as 87/61 on AM of 7/13, suspect due to pain medications.  Otherwise no signs of sepsis. --Maintain MAP > 65 with fluids if needed --Monitor closely        Subjective: Patient awake resting in bed when seen today.  Reports another low-grade fever overnight.  Feels okay today.  Has been up walking laps around the unit. Continues to report pain and asks how that will be handled once out of the hospital.  Wants to go to pain management.   Physical Exam: Vitals:   04/03/22 1547 04/03/22 1957 04/04/22 0601 04/04/22 0755  BP: 108/73 103/69 110/71 98/70  Pulse: 90 93 74 71  Resp: _0 Temp: 99.9 F (37.7 C) 98.8 F (37.1 C) 98.3 F (36.8 C) 98.3 F (36.8 C)  TempSrc:    Oral  SpO2: 95% 98% 100% 100%  Weight:      Height:       General exam: awake sitting up in bed, alert, no acute distress Respiratory system: normal respiratory effort at rest, on room air Cardiovascular  system: RRR, no peripheral edema.   Central nervous system: A&O x3. no gross focal neurologic deficits, normal speech Extremities: moves all, normal tone Gastrointestinal system: soft non-tender abdomen Psychiatry: normal mood, congruent affect   Data Reviewed:  Notable labs --- Hep B viral load > 1 billion.  Glucose 109, Alk phos 160 (rising), AST 160 (rising), ALT 164 (rising), Tprotein 6.1.  Hbg 8.1 up from 7.2, WBC 2.9 stable.  Family Communication: will attempt to update pt's aunt Langley Gauss by phone this afternoon [phone]   Disposition: Status is: Inpatient Remains inpatient appropriate because: On IV antibiotics and still being followed by neurosurgery, pain uncontrolled.  Unsafe to discharge with PICC line for IV antibiotics due to his history of IVDU, needs to remain in hospital for course.   Planned Discharge Destination: Home    Time spent: 35 minutes  Author: Ezekiel Slocumb, DO 04/04/2022 2:59 PM  For on call review [email or web address].

## 2022-04-05 DIAGNOSIS — Z981 Arthrodesis status: Secondary | ICD-10-CM | POA: Diagnosis not present

## 2022-04-05 DIAGNOSIS — G4700 Insomnia, unspecified: Secondary | ICD-10-CM

## 2022-04-05 DIAGNOSIS — M0008 Staphylococcal arthritis, vertebrae: Secondary | ICD-10-CM | POA: Diagnosis not present

## 2022-04-05 DIAGNOSIS — M546 Pain in thoracic spine: Secondary | ICD-10-CM

## 2022-04-05 DIAGNOSIS — A4901 Methicillin susceptible Staphylococcus aureus infection, unspecified site: Secondary | ICD-10-CM | POA: Diagnosis not present

## 2022-04-05 DIAGNOSIS — M4624 Osteomyelitis of vertebra, thoracic region: Secondary | ICD-10-CM | POA: Diagnosis not present

## 2022-04-05 DIAGNOSIS — B169 Acute hepatitis B without delta-agent and without hepatic coma: Secondary | ICD-10-CM | POA: Diagnosis not present

## 2022-04-05 DIAGNOSIS — R768 Other specified abnormal immunological findings in serum: Secondary | ICD-10-CM

## 2022-04-05 DIAGNOSIS — M4620 Osteomyelitis of vertebra, site unspecified: Secondary | ICD-10-CM | POA: Diagnosis not present

## 2022-04-05 DIAGNOSIS — R509 Fever, unspecified: Secondary | ICD-10-CM

## 2022-04-05 DIAGNOSIS — I959 Hypotension, unspecified: Secondary | ICD-10-CM

## 2022-04-05 DIAGNOSIS — D638 Anemia in other chronic diseases classified elsewhere: Secondary | ICD-10-CM

## 2022-04-05 DIAGNOSIS — G062 Extradural and subdural abscess, unspecified: Secondary | ICD-10-CM | POA: Diagnosis not present

## 2022-04-05 LAB — CULTURE, BLOOD (ROUTINE X 2)
Culture: NO GROWTH
Culture: NO GROWTH
Special Requests: ADEQUATE
Special Requests: ADEQUATE

## 2022-04-05 LAB — COMPREHENSIVE METABOLIC PANEL
ALT: 195 U/L — ABNORMAL HIGH (ref 0–44)
AST: 304 U/L — ABNORMAL HIGH (ref 15–41)
Albumin: 2.4 g/dL — ABNORMAL LOW (ref 3.5–5.0)
Alkaline Phosphatase: 194 U/L — ABNORMAL HIGH (ref 38–126)
Anion gap: 4 — ABNORMAL LOW (ref 5–15)
BUN: 16 mg/dL (ref 6–20)
CO2: 25 mmol/L (ref 22–32)
Calcium: 8.8 mg/dL — ABNORMAL LOW (ref 8.9–10.3)
Chloride: 110 mmol/L (ref 98–111)
Creatinine, Ser: 0.84 mg/dL (ref 0.61–1.24)
GFR, Estimated: >60 mL/min (ref >60)
Glucose, Bld: 95 mg/dL (ref 70–99)
Potassium: 4.7 mmol/L (ref 3.5–5.1)
Sodium: 139 mmol/L (ref 135–145)
Total Bilirubin: 1.5 mg/dL — ABNORMAL HIGH (ref 0.3–1.2)
Total Protein: 6.1 g/dL — ABNORMAL LOW (ref 6.5–8.1)

## 2022-04-05 LAB — HCV RNA BY PCR, QN RFX GENO
HCV RNA Qnt(log copy/mL): UNDETERMINED log10 IU/mL
HepC Qn: NOT DETECTED IU/mL

## 2022-04-05 LAB — CBC
HCT: 27.3 % — ABNORMAL LOW (ref 39.0–52.0)
Hemoglobin: 7.9 g/dL — ABNORMAL LOW (ref 13.0–17.0)
MCH: 24.7 pg — ABNORMAL LOW (ref 26.0–34.0)
MCHC: 28.9 g/dL — ABNORMAL LOW (ref 30.0–36.0)
MCV: 85.3 fL (ref 80.0–100.0)
Platelets: 364 10*3/uL (ref 150–400)
RBC: 3.2 MIL/uL — ABNORMAL LOW (ref 4.22–5.81)
RDW: 19.9 % — ABNORMAL HIGH (ref 11.5–15.5)
WBC: 3.1 10*3/uL — ABNORMAL LOW (ref 4.0–10.5)
nRBC: 0 % (ref 0.0–0.2)

## 2022-04-05 LAB — PROTIME-INR
INR: 1.1 (ref 0.8–1.2)
Prothrombin Time: 13.8 seconds (ref 11.4–15.2)

## 2022-04-05 NOTE — Progress Notes (Signed)
Mobility Specialist - Progress Note   04/05/22 1600  Mobility  Activity Ambulated independently in hallway  Level of Assistance Independent  Assistive Device None  Distance Ambulated (ft) 1600 ft  Activity Response Tolerated well  $Mobility charge 1 Mobility     Pt ambulates 10 laps around NS indep.   Clarisa Schools Mobility Specialist 04/05/22, 4:27 PM

## 2022-04-05 NOTE — Progress Notes (Signed)
Progress Note   Patient: Derek Dunn EPP:[id number] DOB: July 29, 1971 DOA: 03/15/2022     21 DOS: the patient was seen and examined on 04/05/2022   Brief hospital course: Derek Dunn is a 51 year old male with PMH polysubstance abuse including IV drug use in the past, housing insecurity (incarcerated past 2 months, just released (as of 7/12), hospital admission in May 2023 with MSSA bacteremia secondary to spinal osteomyelitis and epidural abscess, recurrent right inguinal hernia, history of signing out AMA.  He presented to the ED from [facility] on 03/15/2022 with complaints of worsening back pain and recurrent right groin pain.  No reported fevers.  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  Labs were mostly unremarkable including no leukocytosis and normal lactic acid.  Patient was started on broad-spectrum IV antibiotic coverage and admitted for further evaluation and management.  Interventional radiology was consulted for abscess aspiration for cultures, performed on 7/12. Infectious disease was also consulted.   Neurosurgery consulted for consideration of washout in the OR, given poor likelihood of full resolution with antibiotics alone. He was taken to the OR for washout on 03/24/22.  Underwent posterior laminectomy T8-9 for evacuation of epidural abscess, posterolateral arthrodesis T7 to T12 with T7-12 posterior instrumentation.    Consultants: Neurosurgery Infectious Disease    Assessment and Plan: * Epidural abscess Acute osteomyelitis discitis and facet joint septic arthritis at T8-9 with epidural abscess. Severe spinal stenosis at T8-T9. Acute septic arthritis at the left L5-S1 facet. History of IV drug use. Prior history MSSA bacteremia. --Initially on IV Cefepime, Vancomycin -- IR consulted, performed CT-guided aspiration for culture  on 7/12 --Infectious disease consulted  --Was on nafcillin continuous IV infusion --Antibiotic changed to cefazolin on 7/24 due to rising LFTs --Neurosurgery consulted.  Patient underwent T7-12 posterior instrumentation with decompression and washout of epidural abscess at T8-9 (performed on 03/24/22) -- Drain removed on 03/27/2022  -- Wound VAC removed 26 --Neurosurgery recommends: Mobilize, pain control, resume DVT prophylaxis, PT/OT -- Follow-up with neurosurgery in 2 weeks  7/28: per Neurosurgery, pt no longer needs dressing over incision.  May shower and pat incision area dry.  Septic arthritis (HCC) Signs of septic arthritis at level of L5-S1 seen on MRI lumbar spine.  Management as outlined, see epidural abscess.  Discitis thoracic region See epidural abscess for plan  Vertebral osteomyelitis (HCC) Management as outlined  Transaminitis Likely secondary to acute hepatitis B infection.  Also could have been secondary to nafcillin.  Continue to monitor  Back pain Start tapering pain medications.  Fever Temp 100.4 noted on evening of 7/26. Continues to spike low grade fevers since. 7/27: Negative for Covid-19, Flu A/B 7/28: Surgical incision without signs of infection (see image), repeat CT abd/pelvis no acute findings and hernia appeared improved vs prior scan 7/29: Noted HBV and HCV reactive. -Fever believed to be secondary to acute hepatitis B infection.  Acute hepatitis B Acute hepatitis B infection with viral DNA greater than 1 billion.  Need to check antibodies as outpatient to see if he clears this infection.  Follow-up with GI as outpatient.  Right inguinal hernia S/p reduction in the ED.   --Pain control as needed --Monitor closely for signs of bowel obstruction or incarceration --Surgical consult if recurrent - continue laxative regimen -repeat CT abd/pelvis on the 28th showed a persistent large right inguinal hernia with no evidence of bowel  obstruction.  Hypotension BP as low as 87/61 on AM of 7/13, suspect due to pain medications.  Otherwise no signs of sepsis.   Insomnia Trazodone at bedtime ordered.  Anemia of chronic disease Last hemoglobin 7.9.  Ferritin elevated above 300.  Hepatitis C antibody test positive HCV Ab reactive this admission.  Hepatitis C RNA not detected.  Likely has cleared this infection.        Subjective: Patient feeling well.  Offers no complaints.  Seen earlier walking around the nursing station.  Feels okay.  Physical Exam: Vitals:   04/04/22 1503 04/04/22 1954 04/05/22 0605 04/05/22 0812  BP: 102/72 (!) 125/95 99/66 92/66   Pulse: 78 92 64 70  Resp: 16 18 18    Temp: 98.9 F (37.2 C) 100 F (37.8 C) 98.2 F (36.8 C) 98.6 F (37 C)  TempSrc: Oral Oral Oral Oral  SpO2: 100% 99% 96% 100%  Weight:      Height:       Physical Exam HENT:     Head: Normocephalic.     Mouth/Throat:     Pharynx: No oropharyngeal exudate.  Eyes:     General: Lids are normal.     Conjunctiva/sclera: Conjunctivae normal.  Cardiovascular:     Rate and Rhythm: Normal rate and regular rhythm.     Heart sounds: Normal heart sounds, S1 normal and S2 normal.  Pulmonary:     Breath sounds: No decreased breath sounds, wheezing, rhonchi or rales.  Abdominal:     Palpations: Abdomen is soft.     Tenderness: There is no abdominal tenderness.  Musculoskeletal:     Right lower leg: No swelling.     Left lower leg: No swelling.  Skin:    General: Skin is warm.     Comments: Right buttock stage II decubitus  Neurological:     Mental Status: He is alert and oriented to person, place, and time.     Data Reviewed: Hepatitis B with 1 billion HPV DNA, hepatitis C antibody positive but HCV RNA not detected AST 304, ALT 195, total bilirubin 1.5, creatinine 0.84, hemoglobin 7.9, platelet count 364, white blood cell count 3.1, ferritin 311   Disposition: Status is: Inpatient Remains inpatient appropriate  because: Patient will remain here for the entire 6-week duration of IV antibiotics  Planned Discharge Destination: Home    Time spent: 28 minutes  Author: , MD 04/05/2022 2:16 PM  For on call review [email or web address].

## 2022-04-05 NOTE — Progress Notes (Signed)
ID Pt doing well Ambulating in the floor Pain better No fever  O/e awake and alert Patient Vitals for the past 24 hrs:  BP Temp Temp src Pulse Resp SpO2  04/05/22 1955 109/74 99.1 F (37.3 C) -- 84 20 96 %  04/05/22 1556 (!) 131/55 99 F (37.2 C) Oral 83 -- 93 %  04/05/22 0812 92/66 98.6 F (37 C) Oral 70 -- 100 %  04/05/22 0605 99/66 98.2 F (36.8 C) Oral 64 18 96 %   Chest cta Hs s1s2 Abd soft Spinal surgical site- clean  Labs    Latest Ref Rng & Units 04/05/2022    5:20 AM 04/04/2022    5:39 AM 04/03/2022    5:28 AM  CBC  WBC 4.0 - 10.5 K/uL 3.1  3.1  2.9   Hemoglobin 13.0 - 17.0 g/dL 7.9  7.7  8.1   Hematocrit 39.0 - 52.0 % 27.3  25.6  27.1   Platelets 150 - 400 K/uL 364  353  346        Latest Ref Rng & Units 04/05/2022    5:20 AM 04/04/2022    5:39 AM 04/03/2022    5:28 AM  CMP  Glucose 70 - 99 mg/dL 95   825   BUN 6 - 20 mg/dL 16   17   Creatinine 0.03 - 1.24 mg/dL 7.04   8.88   Sodium 916 - 145 mmol/L 139   141   Potassium 3.5 - 5.1 mmol/L 4.7   5.0   Chloride 98 - 111 mmol/L 110   108   CO2 22 - 32 mmol/L 25   27   Calcium 8.9 - 10.3 mg/dL 8.8   8.9   Total Protein 6.5 - 8.1 g/dL 6.1  6.0  6.1   Total Bilirubin 0.3 - 1.2 mg/dL 1.5  1.3  1.0   Alkaline Phos 38 - 126 U/L 194  173  160   AST 15 - 41 U/L 304  206  227   ALT 0 - 44 U/L 195  150  164      Impression/recommendation MSSA thoracic vertebral infection - s/p fusion Pt is on cefazolin - rifampin cannot be added due to acute hepb hepatitis  Acute HepB hepatitis with high Vl and worsening LFT Coag screen okay  Leucopenia due to hepb viral infection  HEPC -spontaneous clearance  Pt is in the hospital getting IV for 6 weeks  04/26/22// as he cannot be sent home with PICC due to substance use Discussed the management with patient and hospitalist

## 2022-04-05 NOTE — Progress Notes (Signed)
Mobility Specialist - Progress Note    04/05/22 1000  Mobility  Activity Ambulated independently in hallway  Level of Assistance Independent  Assistive Device None  Distance Ambulated (ft) 960 ft  Activity Response Tolerated well  $Mobility charge 1 Mobility    Pt ambulates 6 laps around NS indep voicing no complaints.  Clarisa Schools Mobility Specialist 04/05/22, 10:32 AM

## 2022-04-05 NOTE — Assessment & Plan Note (Addendum)
Last hemoglobin 10.5.  Ferritin elevated above 300.

## 2022-04-06 DIAGNOSIS — M0008 Staphylococcal arthritis, vertebrae: Secondary | ICD-10-CM | POA: Diagnosis not present

## 2022-04-06 DIAGNOSIS — L89312 Pressure ulcer of right buttock, stage 2: Secondary | ICD-10-CM

## 2022-04-06 DIAGNOSIS — L8992 Pressure ulcer of unspecified site, stage 2: Secondary | ICD-10-CM

## 2022-04-06 DIAGNOSIS — M4620 Osteomyelitis of vertebra, site unspecified: Secondary | ICD-10-CM | POA: Diagnosis not present

## 2022-04-06 DIAGNOSIS — G062 Extradural and subdural abscess, unspecified: Secondary | ICD-10-CM | POA: Diagnosis not present

## 2022-04-06 DIAGNOSIS — M546 Pain in thoracic spine: Secondary | ICD-10-CM | POA: Diagnosis not present

## 2022-04-06 LAB — COMPREHENSIVE METABOLIC PANEL
ALT: 199 U/L — ABNORMAL HIGH (ref 0–44)
AST: 306 U/L — ABNORMAL HIGH (ref 15–41)
Albumin: 2.4 g/dL — ABNORMAL LOW (ref 3.5–5.0)
Alkaline Phosphatase: 204 U/L — ABNORMAL HIGH (ref 38–126)
Anion gap: 5 (ref 5–15)
BUN: 15 mg/dL (ref 6–20)
CO2: 25 mmol/L (ref 22–32)
Calcium: 8.7 mg/dL — ABNORMAL LOW (ref 8.9–10.3)
Chloride: 109 mmol/L (ref 98–111)
Creatinine, Ser: 0.93 mg/dL (ref 0.61–1.24)
GFR, Estimated: >60 mL/min (ref >60)
Glucose, Bld: 113 mg/dL — ABNORMAL HIGH (ref 70–99)
Potassium: 4.6 mmol/L (ref 3.5–5.1)
Sodium: 139 mmol/L (ref 135–145)
Total Bilirubin: 1.4 mg/dL — ABNORMAL HIGH (ref 0.3–1.2)
Total Protein: 6.2 g/dL — ABNORMAL LOW (ref 6.5–8.1)

## 2022-04-06 LAB — PROTIME-INR
INR: 1.1 (ref 0.8–1.2)
Prothrombin Time: 14.1 seconds (ref 11.4–15.2)

## 2022-04-06 MED ORDER — SODIUM CHLORIDE 0.9 % IV SOLN
INTRAVENOUS | Status: DC | PRN
  Administered 2022-04-10: 10 mL/h via INTRAVENOUS

## 2022-04-06 NOTE — Assessment & Plan Note (Addendum)
Right buttock.  This has healed.

## 2022-04-06 NOTE — Progress Notes (Signed)
Progress Note   Patient: Derek Dunn [id number]:[id number] DOB: 24-Oct-1970 DOA: 03/15/2022     22 DOS: the patient was seen and examined on 04/06/2022   Brief hospital course: Derek Dunn is a 51 year old male with PMH polysubstance abuse including IV drug use in the past, housing insecurity (incarcerated past 2 months, just released (as of 7/12), hospital admission in May 2023 with MSSA bacteremia secondary to spinal osteomyelitis and epidural abscess, recurrent right inguinal hernia, history of signing out AMA.  He presented to the ED from [facility] on 03/15/2022 with complaints of worsening back pain and recurrent right groin pain.  No reported fevers.  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  Labs were mostly unremarkable including no leukocytosis and normal lactic acid.  Sepsis ruled out.  Patient was started on broad-spectrum IV antibiotic coverage and admitted for further evaluation and management.  Interventional radiology was consulted for abscess aspiration for cultures, performed on 7/12. Infectious disease was also consulted.   Neurosurgery consulted for consideration of washout in the OR, given poor likelihood of full resolution with antibiotics alone. He was taken to the OR for washout on 03/24/22.  Underwent posterior laminectomy T8-9 for evacuation of epidural abscess, posterolateral arthrodesis T7 to T12 with T7-12 posterior instrumentation.    Consultants: Neurosurgery Infectious Disease    Assessment and Plan: * Epidural abscess Acute osteomyelitis discitis and facet joint septic arthritis at T8-9 with epidural abscess. Severe spinal stenosis at T8-T9. Acute septic arthritis at the left L5-S1 facet. History of IV drug use. Prior history MSSA bacteremia. --Initially on IV Cefepime, Vancomycin -- IR consulted, performed CT-guided  aspiration for culture on 7/12 --Infectious disease consulted  --Was on nafcillin continuous IV infusion --Antibiotic changed to cefazolin on 7/24 due to rising LFTs --Neurosurgery consulted.  Patient underwent T7-12 posterior instrumentation with decompression and washout of epidural abscess at T8-9 (performed on 03/24/22) -- Drain removed on 03/27/2022  -- Wound VAC removed 7/26 --Neurosurgery recommends: Mobilize, pain control, resume DVT prophylaxis, PT/OT -- Follow-up with neurosurgery in 2 weeks  7/28: per Neurosurgery, pt no longer needs dressing over incision.  May shower and pat incision area dry.  Septic arthritis (HCC) Signs of septic arthritis at level of L5-S1 seen on MRI lumbar spine.  Management as outlined, see epidural abscess.  Discitis thoracic region See epidural abscess for plan  Vertebral osteomyelitis (HCC) Management as outlined  Transaminitis Likely secondary to acute hepatitis B infection.  Also could have been secondary to nafcillin.  Continue to monitor  Back pain Patient resistant on tapering pain medications.  Will continue to try to taper especially prior to discharge on 04/26/2022.  Fever Temp 100.4 noted on evening of 7/26. Secondary to acute hepatitis B infection  Acute hepatitis B Acute hepatitis B infection with viral DNA greater than 1 billion.  Need to check antibodies as outpatient to see if he clears this infection.  Follow-up with GI as outpatient.  Right inguinal hernia S/p reduction in the ED.   --Pain control as needed --Monitor closely for signs of bowel obstruction or incarceration --Surgical consult if recurrent - continue laxative regimen -repeat CT abd/pelvis on the 28th showed a persistent large right inguinal hernia with no evidence of bowel obstruction.  Hypotension BP as low as 87/61 on AM of 7/13, suspect due to pain medications.  Otherwise no signs of sepsis.   Insomnia Trazodone at  bedtime ordered.  Stage II  decubitus ulcer (HCC) Right buttock.  Advise changing positions and ambulating.  Anemia of chronic disease Last hemoglobin 7.9.  Ferritin elevated above 300.  Hepatitis C antibody test positive HCV Ab reactive this admission.  Hepatitis C RNA not detected.  Likely has cleared this infection.        Subjective: Patient walking around the hallway.  He states that he is in quite a bit of back pain.  He has numbness on the left side of his back.  He is hesitant on tapering the pain medications.  Admitted with epidural abscess  Physical Exam: Vitals:   04/05/22 1556 04/05/22 1955 04/06/22 0357 04/06/22 0903  BP: (!) 131/55 109/74 97/62 100/70  Pulse: 83 84 61 77  Resp:  20 16   Temp: 99 F (37.2 C) 99.1 F (37.3 C) 98.6 F (37 C)   TempSrc: Oral     SpO2: 93% 96% 92% 97%  Weight:      Height:       Physical Exam HENT:     Head: Normocephalic.     Mouth/Throat:     Pharynx: No oropharyngeal exudate.  Eyes:     General: Lids are normal.     Conjunctiva/sclera: Conjunctivae normal.  Cardiovascular:     Rate and Rhythm: Normal rate and regular rhythm.     Heart sounds: Normal heart sounds, S1 normal and S2 normal.  Pulmonary:     Breath sounds: No decreased breath sounds, wheezing, rhonchi or rales.  Abdominal:     Palpations: Abdomen is soft.     Tenderness: There is no abdominal tenderness.  Musculoskeletal:     Right lower leg: No swelling.     Left lower leg: No swelling.  Skin:    General: Skin is warm.     Comments: Right buttock stage II decubitus  Neurological:     Mental Status: He is alert and oriented to person, place, and time.     Comments: Able to straight leg raise bilaterally.     Data Reviewed: AST 306 and ALT 199, total bilirubin 1.4, last hemoglobin 7.9, INR 1.1  Family Communication: Left message for patient's aunt  Disposition: Status is: Inpatient Remains inpatient appropriate because: Will require IV antibiotics in the hospital as per  infectious disease specialist through 04/26/2022  Planned Discharge Destination: Home    Time spent: 28 minutes  Author: Alford Highland, MD 04/06/2022 11:41 AM  For on call review [email or web address].

## 2022-04-07 ENCOUNTER — Encounter: Admitting: Neurosurgery

## 2022-04-07 DIAGNOSIS — M0008 Staphylococcal arthritis, vertebrae: Secondary | ICD-10-CM | POA: Diagnosis not present

## 2022-04-07 DIAGNOSIS — M4620 Osteomyelitis of vertebra, site unspecified: Secondary | ICD-10-CM | POA: Diagnosis not present

## 2022-04-07 DIAGNOSIS — G062 Extradural and subdural abscess, unspecified: Secondary | ICD-10-CM | POA: Diagnosis not present

## 2022-04-07 DIAGNOSIS — R7401 Elevation of levels of liver transaminase levels: Secondary | ICD-10-CM | POA: Diagnosis not present

## 2022-04-07 LAB — COMPREHENSIVE METABOLIC PANEL
ALT: 217 U/L — ABNORMAL HIGH (ref 0–44)
AST: 346 U/L — ABNORMAL HIGH (ref 15–41)
Albumin: 2.5 g/dL — ABNORMAL LOW (ref 3.5–5.0)
Alkaline Phosphatase: 227 U/L — ABNORMAL HIGH (ref 38–126)
Anion gap: 6 (ref 5–15)
BUN: 15 mg/dL (ref 6–20)
CO2: 24 mmol/L (ref 22–32)
Calcium: 8.7 mg/dL — ABNORMAL LOW (ref 8.9–10.3)
Chloride: 109 mmol/L (ref 98–111)
Creatinine, Ser: 0.88 mg/dL (ref 0.61–1.24)
GFR, Estimated: >60 mL/min (ref >60)
Glucose, Bld: 110 mg/dL — ABNORMAL HIGH (ref 70–99)
Potassium: 4.4 mmol/L (ref 3.5–5.1)
Sodium: 139 mmol/L (ref 135–145)
Total Bilirubin: 1.4 mg/dL — ABNORMAL HIGH (ref 0.3–1.2)
Total Protein: 6.3 g/dL — ABNORMAL LOW (ref 6.5–8.1)

## 2022-04-07 LAB — PROTIME-INR
INR: 1.1 (ref 0.8–1.2)
Prothrombin Time: 13.7 seconds (ref 11.4–15.2)

## 2022-04-07 MED ORDER — HYDROMORPHONE HCL 1 MG/ML IJ SOLN
1.0000 mg | INTRAMUSCULAR | Status: DC | PRN
  Administered 2022-04-07 – 2022-04-09 (×12): 1 mg via INTRAVENOUS
  Filled 2022-04-07 (×12): qty 1

## 2022-04-07 NOTE — Progress Notes (Signed)
Progress Note   Patient: Derek Dunn MVH:[id number] DOB: [birth year] DOA: 03/15/2022     23 DOS: the patient was seen and examined on 04/07/2022   Brief hospital course: Derek Dunn is a 50 year old male with PMH polysubstance abuse including IV drug use in the past, housing insecurity (incarcerated past 2 months, just released (as of 7/12), hospital admission in May 2023 with MSSA bacteremia secondary to spinal osteomyelitis and epidural abscess, recurrent right inguinal hernia, history of signing out AMA.  He presented to the ED from [facility] on 03/15/2022 with complaints of worsening back pain and recurrent right groin pain.  No reported fevers.  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  Labs were mostly unremarkable including no leukocytosis and normal lactic acid.  Sepsis ruled out.  Patient was started on broad-spectrum IV antibiotic coverage and admitted for further evaluation and management.  Interventional radiology was consulted for abscess aspiration for cultures, performed on 7/12. Infectious disease was also consulted.   Neurosurgery consulted for consideration of washout in the OR, given poor likelihood of full resolution with antibiotics alone. He was taken to the OR for washout on 03/24/22.  Underwent posterior laminectomy T8-9 for evacuation of epidural abscess, posterolateral arthrodesis T7 to T12 with T7-12 posterior instrumentation.   The patient will remain in the hospital to 04/26/2022 for IV antibiotics.    Assessment and Plan: * Epidural abscess Acute osteomyelitis discitis and facet joint septic arthritis at T8-9 with epidural abscess. Severe spinal stenosis at T8-T9. Acute septic arthritis at the left L5-S1 facet. History of IV drug use. Prior history MSSA bacteremia. --Initially on IV Cefepime, Vancomycin -- IR consulted,  performed CT-guided aspiration for culture on 7/12 --Infectious disease consulted  --Was on nafcillin continuous IV infusion --Antibiotic changed to cefazolin on 7/24 due to rising LFTs --Neurosurgery consulted.  Patient underwent T7-12 posterior instrumentation with decompression and washout of epidural abscess at T8-9 (performed on 03/24/22) -- Drain removed on 03/27/2022  -- Wound VAC removed 7/26 --Neurosurgery recommends: Mobilize, pain control, resume DVT prophylaxis, PT/OT -- Follow-up with neurosurgery in 2 weeks  7/28: per Neurosurgery, pt no longer needs dressing over incision.  May shower and pat incision area dry.  Septic arthritis (HCC) Signs of septic arthritis at level of L5-S1 seen on MRI lumbar spine.  Management as outlined, see epidural abscess.  Discitis thoracic region See epidural abscess for plan  Vertebral osteomyelitis (HCC) Management as outlined  Transaminitis Likely secondary to acute hepatitis B infection.  Also could have been secondary to nafcillin.  Continue to monitor  Back pain Patient resistant on tapering pain medications.  Will continue to try to taper especially prior to discharge on 04/26/2022.  Fever Temp 100.4 noted on evening of 7/26. Secondary to acute hepatitis B infection  Acute hepatitis B Acute hepatitis B infection with viral DNA greater than 1 billion.  Need to check antibodies as outpatient to see if he clears this infection.  Follow-up with GI as outpatient.  Right inguinal hernia S/p reduction in the ED.   --Pain control as needed --Monitor closely for signs of bowel obstruction or incarceration --Surgical consult if recurrent - continue laxative regimen -repeat CT abd/pelvis on the 28th showed a persistent large right inguinal hernia with no evidence of bowel obstruction.  Hypotension BP as low as 87/61 on AM of 7/13, suspect due to pain medications.  Otherwise no signs  of sepsis.   Insomnia Trazodone at bedtime  ordered.  Stage II decubitus ulcer (HCC) Right buttock.  Advise changing positions and ambulating.  Anemia of chronic disease Last hemoglobin 7.9.  Ferritin elevated above 300.  Hepatitis C antibody test positive HCV Ab reactive this admission.  Hepatitis C RNA not detected.  Likely has cleared this infection.        Subjective: Patient feeling okay.  Does have some back pain.  He is interested in going on Suboxone in about a week.  Still on IV and oral pain medications.  Physical Exam: Vitals:   04/06/22 1940 04/07/22 0554 04/07/22 0651 04/07/22 0801  BP: 121/71 90/65 101/67 109/69  Pulse: 80 64 67 67  Resp: 18 16  18   Temp: 99.4 F (37.4 C) 98.7 F (37.1 C)  98.4 F (36.9 C)  TempSrc:  Oral    SpO2: 97% 97%  98%  Weight:      Height:       Physical Exam HENT:     Head: Normocephalic.     Mouth/Throat:     Pharynx: No oropharyngeal exudate.  Eyes:     General: Lids are normal.     Conjunctiva/sclera: Conjunctivae normal.  Cardiovascular:     Rate and Rhythm: Normal rate and regular rhythm.     Heart sounds: Normal heart sounds, S1 normal and S2 normal.  Pulmonary:     Breath sounds: No decreased breath sounds, wheezing, rhonchi or rales.  Abdominal:     Palpations: Abdomen is soft.     Tenderness: There is no abdominal tenderness.  Musculoskeletal:     Right lower leg: No swelling.     Left lower leg: No swelling.  Skin:    General: Skin is warm.     Comments: Right back couple areas where the skin turned blackish with a heating pad  Neurological:     Mental Status: He is alert and oriented to person, place, and time.     Comments: Able to straight leg raise bilaterally.     Data Reviewed: AST 346, ALT 217, total bilirubin 1.4  Disposition: Status is: Inpatient Remains inpatient appropriate because: Will need to remain here in the hospital for IV antibiotics through 04/26/22.  Planned Discharge Destination: Home    Time spent: 27  minutes  Author: 04/28/22, MD 04/07/2022 10:18 AM  For on call review [email or web address].

## 2022-04-07 NOTE — Progress Notes (Signed)
Mobility Specialist - Progress Note    04/07/22 1341  Mobility  Activity Ambulated independently in hallway  Level of Assistance Independent  Assistive Device None  Distance Ambulated (ft) 1600 ft  Activity Response Tolerated well  $Mobility charge 1 Mobility    Pt OOB heading out of room on RA. Pt ambulates 10 laps around NS indep. Pt voices no concerns.   Terrilyn Saver  Mobility Specialist  04/07/22 1:44 PM

## 2022-04-07 NOTE — Progress Notes (Signed)
    Attending Progress Note  History: Derek Dunn is s/p T7-12 posterior instrumentation with decompression and washout of epidural abscess at T8-9.  I saw Derek Dunn today for a 2 week post-op check/ incision check today as he was scheduled to follow up in clinic but remains hospitalized for IV antibiotics. He reports improved of his back pain however continued soreness with movement and some incision itching. He denies any incisional drainage   Physical Exam: Vitals:   04/07/22 0651 04/07/22 0801  BP: 101/67 109/69  Pulse: 67 67  Resp:  18  Temp:  98.4 F (36.9 C)  SpO2:  98%    AA Ox3 sitting at EOB eating breakfast  CNI  Strength:5/5 throughout   Incision c/d/I and healing well with some scabbing at the distal 1/3. No signs of infection.   Data:  Recent Labs  Lab 04/05/22 0520 04/06/22 0544 04/07/22 0544  NA 139 139 139  K 4.7 4.6 4.4  CL 110 109 109  CO2 25 25 24   BUN 16 15 15   CREATININE 0.84 0.93 0.88  GLUCOSE 95 113* 110*  CALCIUM 8.8* 8.7* 8.7*   Recent Labs  Lab 04/07/22 0544  AST 346*  ALT 217*  ALKPHOS 227*     Recent Labs  Lab 04/03/22 0528 04/04/22 0539 04/05/22 0520  WBC 2.9* 3.1* 3.1*  HGB 8.1* 7.7* 7.9*  HCT 27.1* 25.6* 27.3*  PLT 346 353 364   Recent Labs  Lab 04/05/22 0520 04/06/22 0544 04/07/22 0544  INR 1.1 1.1 1.1         Assessment/Plan:  Derek Dunn is a 51 y.o here for thoracic discitis with destruction at T8-9 and instability related to his discitis.  He is post-op from T7-12 posterior instrumentation with decompression and washout of epidural abscess at T8-9  - continue with POC per internal medicine and ID - Will plan for follow up with Dr. [name] in 4 weeks with thoracic xrays prior.  - please call with any questions or concerns.  44 PA-C Department of Neurosurgery

## 2022-04-08 DIAGNOSIS — M0008 Staphylococcal arthritis, vertebrae: Secondary | ICD-10-CM | POA: Diagnosis not present

## 2022-04-08 DIAGNOSIS — M4620 Osteomyelitis of vertebra, site unspecified: Secondary | ICD-10-CM | POA: Diagnosis not present

## 2022-04-08 DIAGNOSIS — R7401 Elevation of levels of liver transaminase levels: Secondary | ICD-10-CM | POA: Diagnosis not present

## 2022-04-08 DIAGNOSIS — G062 Extradural and subdural abscess, unspecified: Secondary | ICD-10-CM | POA: Diagnosis not present

## 2022-04-08 NOTE — Progress Notes (Signed)
Progress Note   Patient: Derek Dunn ZOX:[id number] DOB: April 06, 1971 DOA: 03/15/2022     24 DOS: the patient was seen and examined on 04/08/2022   Brief hospital course: Derek Dunn is a 51 year old male with PMH polysubstance abuse including IV drug use in the past, housing insecurity (incarcerated past 2 months, just released (as of 7/12), hospital admission in May 2023 with MSSA bacteremia secondary to spinal osteomyelitis and epidural abscess, recurrent right inguinal hernia, history of signing out AMA.  He presented to the ED from [facility] on 03/15/2022 with complaints of worsening back pain and recurrent right groin pain.  No reported fevers.  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  Labs were mostly unremarkable including no leukocytosis and normal lactic acid.  Sepsis ruled out.  Patient was started on broad-spectrum IV antibiotic coverage and admitted for further evaluation and management.  Interventional radiology was consulted for abscess aspiration for cultures, performed on 7/12. Infectious disease was also consulted.   Neurosurgery consulted for consideration of washout in the OR, given poor likelihood of full resolution with antibiotics alone. He was taken to the OR for washout on 03/24/22.  Underwent posterior laminectomy T8-9 for evacuation of epidural abscess, posterolateral arthrodesis T7 to T12 with T7-12 posterior instrumentation.   The patient will remain in the hospital to 04/26/2022 for IV antibiotics.    Assessment and Plan: * Epidural abscess Acute osteomyelitis discitis and facet joint septic arthritis at T8-9 with epidural abscess. Severe spinal stenosis at T8-T9. Acute septic arthritis at the left L5-S1 facet. History of IV drug use. Prior history MSSA bacteremia. --Initially on IV Cefepime, Vancomycin -- IR consulted,  performed CT-guided aspiration for culture on 7/12 --Infectious disease consulted  --Was on nafcillin continuous IV infusion --Antibiotic changed to cefazolin on 7/24 due to rising LFTs --Neurosurgery consulted.  Patient underwent T7-12 posterior instrumentation with decompression and washout of epidural abscess at T8-9 (performed on 03/24/22) -- Drain removed on 03/27/2022  -- Wound VAC removed 7/26 --Neurosurgery recommends: Mobilize, pain control, resume DVT prophylaxis, PT/OT -- Follow-up with neurosurgery in 2 weeks  7/28: per Neurosurgery, pt no longer needs dressing over incision.  May shower and pat incision area dry.  Patient will remain in the hospital for IV antibiotics through 04/26/2022.  Septic arthritis (HCC) Signs of septic arthritis at level of L5-S1 seen on MRI lumbar spine.  Management as outlined, see epidural abscess.  Discitis thoracic region See epidural abscess for plan  Vertebral osteomyelitis (HCC) Management as outlined  Transaminitis Likely secondary to acute hepatitis B infection.  Also could have been secondary to nafcillin.  Continue to monitor  Back pain Discussion on tapering pain medications.  He is okay with leaving the Dilaudid IV every 3 hours today and potentially willing to go to oral medication tomorrow.  Continue oxycodone.  Fever Temp 100.4 noted on evening of 7/26. Secondary to acute hepatitis B infection  Acute hepatitis B Acute hepatitis B infection with viral DNA greater than 1 billion.  Need to check antibodies as outpatient to see if he clears this infection.  Follow-up with GI as outpatient.  Right inguinal hernia S/p reduction in the ED.   --Pain control as needed --Monitor closely for signs of bowel obstruction or incarceration --Surgical consult if recurrent - continue laxative regimen -repeat CT abd/pelvis on the 28th showed a persistent large right inguinal hernia with no evidence  of bowel obstruction.  Hypotension BP as  low as 87/61 on AM of 7/13, suspect due to pain medications.  Otherwise no signs of sepsis.   Insomnia Trazodone at bedtime ordered.  Stage II decubitus ulcer (HCC) Right buttock.  Advise changing positions and ambulating.  Anemia of chronic disease Last hemoglobin 7.9.  Ferritin elevated above 300.  Hepatitis C antibody test positive HCV Ab reactive this admission.  Hepatitis C RNA not detected.  Likely has cleared this infection.        Subjective: Patient upset about Dilaudid being changed every 3 hours instead of every 2 hours.  I explained to him that we need to start tapering Dilaudid down and potentially over to oral.  Patient is potentially willing to go over to oral medications tomorrow.  Complains of back pain.  Patient complains of little swelling of the lower extremities.  Physical Exam: Vitals:   04/07/22 1516 04/07/22 1927 04/08/22 0417 04/08/22 0751  BP: 98/64 112/71 (!) 89/52 93/62  Pulse: 61 80 70 (!) 56  Resp: 17 17 17 20   Temp: 99 F (37.2 C) 99.2 F (37.3 C) 98.4 F (36.9 C) 98.5 F (36.9 C)  TempSrc:      SpO2: 97% 97% 95% 96%  Weight:      Height:       Physical Exam HENT:     Head: Normocephalic.     Mouth/Throat:     Pharynx: No oropharyngeal exudate.  Eyes:     General: Lids are normal.     Conjunctiva/sclera: Conjunctivae normal.  Cardiovascular:     Rate and Rhythm: Normal rate and regular rhythm.     Heart sounds: Normal heart sounds, S1 normal and S2 normal.  Pulmonary:     Breath sounds: No decreased breath sounds, wheezing, rhonchi or rales.  Abdominal:     Palpations: Abdomen is soft.     Tenderness: There is no abdominal tenderness.  Musculoskeletal:     Right lower leg: No swelling.     Left lower leg: No swelling.  Skin:    General: Skin is warm.     Comments: Right back couple areas where the skin turned blackish with a heating pad  Neurological:     Mental Status: He is alert and oriented to person, place, and time.      Comments: Able to straight leg raise bilaterally.     Data Reviewed: AST 346, ALT 217 Disposition: Status is: Inpatient Remains inpatient appropriate because: Tired course of IV antibiotics will be given here in the hospital.  Planned Discharge Destination: Home    Time spent: 28 minutes  Author: , MD 04/08/2022 2:22 PM  For on call review [email or web address].

## 2022-04-08 NOTE — Plan of Care (Signed)
  Problem: Health Behavior/Discharge Planning: Goal: Ability to manage health-related needs will improve Outcome: Progressing   Problem: Clinical Measurements: Goal: Ability to maintain clinical measurements within normal limits will improve Outcome: Progressing Goal: Will remain free from infection Outcome: Progressing Goal: Diagnostic test results will improve Outcome: Progressing Goal: Respiratory complications will improve Outcome: Progressing Goal: Cardiovascular complication will be avoided Outcome: Progressing   Problem: Activity: Goal: Risk for activity intolerance will decrease Outcome: Progressing   Problem: Nutrition: Goal: Adequate nutrition will be maintained Outcome: Progressing   Problem: Coping: Goal: Level of anxiety will decrease Outcome: Progressing   Problem: Elimination: Goal: Will not experience complications related to bowel motility Outcome: Progressing Goal: Will not experience complications related to urinary retention Outcome: Progressing   Problem: Pain Managment: Goal: General experience of comfort will improve Outcome: Progressing   Problem: Safety: Goal: Ability to remain free from injury will improve Outcome: Progressing   Problem: Skin Integrity: Goal: Risk for impaired skin integrity will decrease Outcome: Progressing   Problem: Education: Goal: Ability to verbalize activity precautions or restrictions will improve Outcome: Progressing Goal: Knowledge of the prescribed therapeutic regimen will improve Outcome: Progressing Goal: Understanding of discharge needs will improve Outcome: Progressing   Problem: Activity: Goal: Ability to avoid complications of mobility impairment will improve Outcome: Progressing Goal: Ability to tolerate increased activity will improve Outcome: Progressing Goal: Will remain free from falls Outcome: Progressing   Problem: Bowel/Gastric: Goal: Gastrointestinal status for postoperative course  will improve Outcome: Progressing   Problem: Clinical Measurements: Goal: Ability to maintain clinical measurements within normal limits will improve Outcome: Progressing Goal: Postoperative complications will be avoided or minimized Outcome: Progressing Goal: Diagnostic test results will improve Outcome: Progressing   Problem: Pain Management: Goal: Pain level will decrease Outcome: Progressing   Problem: Skin Integrity: Goal: Will show signs of wound healing Outcome: Progressing   Problem: Health Behavior/Discharge Planning: Goal: Identification of resources available to assist in meeting health care needs will improve Outcome: Progressing   Problem: Bladder/Genitourinary: Goal: Urinary functional status for postoperative course will improve Outcome: Progressing   

## 2022-04-08 NOTE — Progress Notes (Signed)
Chaplain responded to spiritual care consult for pt. Chaplain introduced herself to pt. and pt was alert and receptive to visit. Pt. requested a bible. Chaplain provided a bible. Please contact Chaplain if needed.

## 2022-04-09 DIAGNOSIS — M4620 Osteomyelitis of vertebra, site unspecified: Secondary | ICD-10-CM | POA: Diagnosis not present

## 2022-04-09 DIAGNOSIS — G062 Extradural and subdural abscess, unspecified: Secondary | ICD-10-CM | POA: Diagnosis not present

## 2022-04-09 DIAGNOSIS — M0008 Staphylococcal arthritis, vertebrae: Secondary | ICD-10-CM | POA: Diagnosis not present

## 2022-04-09 DIAGNOSIS — M4644 Discitis, unspecified, thoracic region: Secondary | ICD-10-CM | POA: Diagnosis not present

## 2022-04-09 LAB — COMPREHENSIVE METABOLIC PANEL
ALT: 250 U/L — ABNORMAL HIGH (ref 0–44)
AST: 434 U/L — ABNORMAL HIGH (ref 15–41)
Albumin: 2.7 g/dL — ABNORMAL LOW (ref 3.5–5.0)
Alkaline Phosphatase: 249 U/L — ABNORMAL HIGH (ref 38–126)
Anion gap: 5 (ref 5–15)
BUN: 15 mg/dL (ref 6–20)
CO2: 25 mmol/L (ref 22–32)
Calcium: 8.8 mg/dL — ABNORMAL LOW (ref 8.9–10.3)
Chloride: 109 mmol/L (ref 98–111)
Creatinine, Ser: 1 mg/dL (ref 0.61–1.24)
GFR, Estimated: >60 mL/min (ref >60)
Glucose, Bld: 128 mg/dL — ABNORMAL HIGH (ref 70–99)
Potassium: 4.8 mmol/L (ref 3.5–5.1)
Sodium: 139 mmol/L (ref 135–145)
Total Bilirubin: 1.7 mg/dL — ABNORMAL HIGH (ref 0.3–1.2)
Total Protein: 6.6 g/dL (ref 6.5–8.1)

## 2022-04-09 LAB — CBC
HCT: 32 % — ABNORMAL LOW (ref 39.0–52.0)
Hemoglobin: 9.3 g/dL — ABNORMAL LOW (ref 13.0–17.0)
MCH: 25.4 pg — ABNORMAL LOW (ref 26.0–34.0)
MCHC: 29.1 g/dL — ABNORMAL LOW (ref 30.0–36.0)
MCV: 87.4 fL (ref 80.0–100.0)
Platelets: 327 10*3/uL (ref 150–400)
RBC: 3.66 MIL/uL — ABNORMAL LOW (ref 4.22–5.81)
RDW: 19.8 % — ABNORMAL HIGH (ref 11.5–15.5)
WBC: 3.9 10*3/uL — ABNORMAL LOW (ref 4.0–10.5)
nRBC: 0 % (ref 0.0–0.2)

## 2022-04-09 MED ORDER — HYDROMORPHONE HCL 2 MG PO TABS
1.0000 mg | ORAL_TABLET | ORAL | Status: DC | PRN
  Administered 2022-04-09 (×3): 1 mg via ORAL
  Filled 2022-04-09 (×3): qty 1

## 2022-04-09 NOTE — Progress Notes (Signed)
Progress Note   Patient: Derek Dunn PIR:[id number] DOB: [birth year] DOA: 03/15/2022     25 DOS: the patient was seen and examined on 04/09/2022   Brief hospital course: Mr. Guidotti is a 51 year old male with PMH polysubstance abuse including IV drug use in the past, housing insecurity (incarcerated past 2 months, just released (as of 7/12), hospital admission in May 2023 with MSSA bacteremia secondary to spinal osteomyelitis and epidural abscess, recurrent right inguinal hernia, history of signing out AMA.  He presented to the ED from [facility] on 03/15/2022 with complaints of worsening back pain and recurrent right groin pain.  No reported fevers.  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  Labs were mostly unremarkable including no leukocytosis and normal lactic acid.  Sepsis ruled out.  Patient was started on broad-spectrum IV antibiotic coverage and admitted for further evaluation and management.  Interventional radiology was consulted for abscess aspiration for cultures, performed on 7/12. Infectious disease was also consulted.   Neurosurgery consulted for consideration of washout in the OR, given poor likelihood of full resolution with antibiotics alone. He was taken to the OR for washout on 03/24/22.  Underwent posterior laminectomy T8-9 for evacuation of epidural abscess, posterolateral arthrodesis T7 to T12 with T7-12 posterior instrumentation.   The patient will remain in the hospital to 04/26/2022 for IV antibiotics.    Assessment and Plan: * Epidural abscess Acute osteomyelitis discitis and facet joint septic arthritis at T8-9 with epidural abscess. Severe spinal stenosis at T8-T9. Acute septic arthritis at the left L5-S1 facet. History of IV drug use. Prior history MSSA bacteremia. --Initially on IV Cefepime, Vancomycin -- IR consulted,  performed CT-guided aspiration for culture on 7/12 --Infectious disease consulted  --Was on nafcillin continuous IV infusion --Antibiotic changed to cefazolin on 7/24 due to rising LFTs --Neurosurgery consulted.  Patient underwent T7-12 posterior instrumentation with decompression and washout of epidural abscess at T8-9 (performed on 03/24/22) -- Drain removed on 03/27/2022  -- Wound VAC removed 7/26 --Neurosurgery recommends: Mobilize, pain control, resume DVT prophylaxis, PT/OT -- Follow-up with neurosurgery in 2 weeks  7/28: per Neurosurgery, pt no longer needs dressing over incision.  May shower and pat incision area dry.  Patient will remain in the hospital for IV antibiotics through 04/26/2022.  Septic arthritis (HCC) Signs of septic arthritis at level of L5-S1 seen on MRI lumbar spine.  Management as outlined, see epidural abscess.  Discitis thoracic region See epidural abscess for plan  Vertebral osteomyelitis (HCC) Management as outlined  Transaminitis Likely secondary to acute hepatitis B infection.  Also could have been secondary to nafcillin.  LFTs still on the rise with AST of 434 and ALT of 252 total bilirubin 1.7.  Will need to continue to monitor.  Discontinue Tylenol.  Back pain Switch patient over to oral Dilaudid for breakthrough pain.  Continue oxycodone.  Fever Temp 100.4 noted on evening of 7/26. Secondary to acute hepatitis B infection  Acute hepatitis B Acute hepatitis B infection with viral DNA greater than 1 billion.  Need to check antibodies as outpatient to see if he clears this infection.  Follow-up with GI as outpatient.  Right inguinal hernia -repeat CT abd/pelvis on the 28th showed a persistent large right inguinal hernia with no evidence of bowel obstruction.  Hypotension BP as low as 87/61 on AM of 7/13, suspect due to pain medications.  Otherwise no signs  of sepsis.   Insomnia Trazodone at bedtime ordered.  Stage II decubitus ulcer  (HCC) Right buttock.  Advise changing positions and ambulating.  This area now has a scab on it and looking good.  No signs of infection  Anemia of chronic disease Last hemoglobin 9.3.  Ferritin elevated above 300.  Hepatitis C antibody test positive HCV Ab reactive this admission.  Hepatitis C RNA not detected.  Likely has cleared this infection.        Subjective: Patient complains of some soreness in his back and some itching.  Willing to convert off of IV Dilaudid onto oral Dilaudid today.  Initially came in 25 days ago and found to have an epidural abscess  Physical Exam: Vitals:   04/08/22 1550 04/08/22 2102 04/09/22 0512 04/09/22 0846  BP: 106/74 115/75 95/68 104/78  Pulse: 72 81 69 (!) 58  Resp: 20 18 16 20   Temp: 98.6 F (37 C) 98.8 F (37.1 C) 98.7 F (37.1 C) (!) 97.5 F (36.4 C)  TempSrc:      SpO2: (!) 87% 100% 97% 99%  Weight:      Height:       Physical Exam HENT:     Head: Normocephalic.     Mouth/Throat:     Pharynx: No oropharyngeal exudate.  Eyes:     General: Lids are normal.     Conjunctiva/sclera: Conjunctivae normal.  Cardiovascular:     Rate and Rhythm: Normal rate and regular rhythm.     Heart sounds: Normal heart sounds, S1 normal and S2 normal.  Pulmonary:     Breath sounds: No decreased breath sounds, wheezing, rhonchi or rales.  Abdominal:     Palpations: Abdomen is soft.     Tenderness: There is no abdominal tenderness.  Musculoskeletal:     Right lower leg: No swelling.     Left lower leg: No swelling.  Skin:    General: Skin is warm.     Comments: Right back couple areas where the skin turned blackish with a heating pad. Right buttock has a scab over where the area of skin breakdown was.  Neurological:     Mental Status: He is alert and oriented to person, place, and time.     Comments: Able to straight leg raise bilaterally.     Data Reviewed: AST up to 434, ALT up to 250, total bilirubin 1.7, creatinine 1.0, hemoglobin  9.3, white blood cell count 3.9  Family Communication: Left message for  Disposition: Status is: Inpatient Remains inpatient appropriate because: Will need entire course of IV antibiotics here in the hospital through 04/26/2022.  LFTs rising will need to continue to follow while here. Planned Discharge Destination: Home    Time spent: 27 minutes  Author: 04/28/2022, MD 04/09/2022 2:26 PM  For on call review [email or web address].

## 2022-04-10 DIAGNOSIS — R7401 Elevation of levels of liver transaminase levels: Secondary | ICD-10-CM | POA: Diagnosis not present

## 2022-04-10 DIAGNOSIS — M4644 Discitis, unspecified, thoracic region: Secondary | ICD-10-CM | POA: Diagnosis not present

## 2022-04-10 DIAGNOSIS — M0008 Staphylococcal arthritis, vertebrae: Secondary | ICD-10-CM | POA: Diagnosis not present

## 2022-04-10 DIAGNOSIS — M4620 Osteomyelitis of vertebra, site unspecified: Secondary | ICD-10-CM | POA: Diagnosis not present

## 2022-04-10 DIAGNOSIS — B169 Acute hepatitis B without delta-agent and without hepatic coma: Secondary | ICD-10-CM | POA: Diagnosis not present

## 2022-04-10 DIAGNOSIS — M4624 Osteomyelitis of vertebra, thoracic region: Secondary | ICD-10-CM | POA: Diagnosis not present

## 2022-04-10 DIAGNOSIS — G062 Extradural and subdural abscess, unspecified: Secondary | ICD-10-CM | POA: Diagnosis not present

## 2022-04-10 LAB — COMPREHENSIVE METABOLIC PANEL
ALT: 281 U/L — ABNORMAL HIGH (ref 0–44)
AST: 529 U/L — ABNORMAL HIGH (ref 15–41)
Albumin: 2.6 g/dL — ABNORMAL LOW (ref 3.5–5.0)
Alkaline Phosphatase: 247 U/L — ABNORMAL HIGH (ref 38–126)
Anion gap: 6 (ref 5–15)
BUN: 14 mg/dL (ref 6–20)
CO2: 26 mmol/L (ref 22–32)
Calcium: 9.2 mg/dL (ref 8.9–10.3)
Chloride: 110 mmol/L (ref 98–111)
Creatinine, Ser: 0.95 mg/dL (ref 0.61–1.24)
GFR, Estimated: >60 mL/min (ref >60)
Glucose, Bld: 109 mg/dL — ABNORMAL HIGH (ref 70–99)
Potassium: 4.8 mmol/L (ref 3.5–5.1)
Sodium: 142 mmol/L (ref 135–145)
Total Bilirubin: 1.6 mg/dL — ABNORMAL HIGH (ref 0.3–1.2)
Total Protein: 6.6 g/dL (ref 6.5–8.1)

## 2022-04-10 LAB — PROTIME-INR
INR: 1 (ref 0.8–1.2)
Prothrombin Time: 13.4 seconds (ref 11.4–15.2)

## 2022-04-10 LAB — HEPATITIS B SURFACE ANTIBODY, QUANTITATIVE: Hep B S AB Quant (Post): <3.1 m[IU]/mL — ABNORMAL LOW (ref >9.9)

## 2022-04-10 MED ORDER — OXYCODONE HCL 5 MG PO TABS
10.0000 mg | ORAL_TABLET | ORAL | Status: DC | PRN
  Administered 2022-04-10 – 2022-04-11 (×9): 10 mg via ORAL
  Filled 2022-04-10 (×9): qty 2

## 2022-04-10 MED ORDER — HYDROMORPHONE HCL 1 MG/ML IJ SOLN
0.5000 mg | INTRAMUSCULAR | Status: DC | PRN
  Administered 2022-04-10 – 2022-04-11 (×5): 0.5 mg via INTRAVENOUS
  Filled 2022-04-10 (×5): qty 1

## 2022-04-10 NOTE — Progress Notes (Signed)
Progress Note   Patient: Derek Dunn GUY:[id number] DOB: 1971/03/24 DOA: 03/15/2022     26 DOS: the patient was seen and examined on 04/10/2022   Brief hospital course: Derek Dunn is a 51 year old male with PMH polysubstance abuse including IV drug use in the past, housing insecurity (incarcerated past 2 months, just released (as of 7/12), hospital admission in May 2023 with MSSA bacteremia secondary to spinal osteomyelitis and epidural abscess, recurrent right inguinal hernia, history of signing out AMA.  He presented to the ED from [facility] on 03/15/2022 with complaints of worsening back pain and recurrent right groin pain.  No reported fevers.  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  Labs were mostly unremarkable including no leukocytosis and normal lactic acid.  Sepsis ruled out.  Patient was started on broad-spectrum IV antibiotic coverage and admitted for further evaluation and management.  Interventional radiology was consulted for abscess aspiration for cultures, performed on 7/12. Infectious disease was also consulted.   Neurosurgery consulted for consideration of washout in the OR, given poor likelihood of full resolution with antibiotics alone. He was taken to the OR for washout on 03/24/22.  Underwent posterior laminectomy T8-9 for evacuation of epidural abscess, posterolateral arthrodesis T7 to T12 with T7-12 posterior instrumentation.   The patient will remain in the hospital to 04/26/2022 for IV antibiotics.  On 04/10/2022 the patient wants to go back on IV Dilaudid.  I explained to the patient that we need to get him off of this.  He is okay to go on Suboxone on Wednesday.  Switch to lower dose Dilaudid IV with plans to go over to Suboxone on Wednesday   Assessment and Plan: * Epidural abscess Acute osteomyelitis discitis and facet  joint septic arthritis at T8-9 with epidural abscess. Severe spinal stenosis at T8-T9. Acute septic arthritis at the left L5-S1 facet. History of IV drug use. Prior history MSSA bacteremia. --Initially on IV Cefepime, Vancomycin -- IR consulted, performed CT-guided aspiration for culture on 7/12 --Infectious disease consulted  --Was on nafcillin continuous IV infusion --Antibiotic changed to cefazolin on 7/24 due to rising LFTs --Neurosurgery consulted.  Patient underwent T7-12 posterior instrumentation with decompression and washout of epidural abscess at T8-9 (performed on 03/24/22) -- Drain removed on 03/27/2022  -- Wound VAC removed 7/26 --Neurosurgery recommends: Mobilize, pain control, resume DVT prophylaxis, PT/OT -- Follow-up with neurosurgery in 2 weeks  7/28: per Neurosurgery, pt no longer needs dressing over incision.  May shower and pat incision area dry.  Patient will remain in the hospital for IV antibiotics through 04/26/2022.  Septic arthritis (HCC) Signs of septic arthritis at level of L5-S1 seen on MRI lumbar spine.  Management as outlined, see epidural abscess.  Discitis thoracic region See epidural abscess for plan  Vertebral osteomyelitis (HCC) Management as outlined  Transaminitis Likely secondary to acute hepatitis B infection.  Also could have been secondary to nafcillin.  LFTs still on the rise with AST of 529 and ALT of 228, total bilirubin 1.9.  INR 1.0  Back pain Patient now bargaining with me about pain medications.  I would like to get the patient over to Suboxone.  He would like to do it on Wednesday.  He states the oral Dilaudid did not help him.  He is okay tapering down on the oxycodone but wanted IV diet wanted.  I will give a smaller dose and  spread out the duration.  The quicker I can get him to Suboxone the better.  Fever Temp 100.4 noted on evening of 7/26. Secondary to acute hepatitis B infection  Acute hepatitis B Acute hepatitis B  infection with viral DNA greater than 1 billion.  Need to check antibodies as outpatient to see if he clears this infection.  Follow-up with GI as outpatient.  Right inguinal hernia -repeat CT abd/pelvis on the 28th showed a persistent large right inguinal hernia with no evidence of bowel obstruction.  Hypotension BP as low as 87/61 on AM of 7/13, suspect due to pain medications.  Otherwise no signs of sepsis.   Insomnia Trazodone at bedtime ordered.  Stage II decubitus ulcer (HCC) Right buttock.  Advise changing positions and ambulating.  This area now has a scab on it and looking good.  No signs of infection  Anemia of chronic disease Last hemoglobin 9.3.  Ferritin elevated above 300.  Hepatitis C antibody test positive HCV Ab reactive this admission.  Hepatitis C RNA not detected.  Likely has cleared this infection.        Subjective: Patient asking to go back on IV Dilaudid.  He says the oral Dilaudid does not help him.  I offered him to go on Suboxone today and he declined and he would like to go on Suboxone on Wednesday.  Physical Exam: Vitals:   04/09/22 2026 04/10/22 0050 04/10/22 0507 04/10/22 0800  BP: 113/79 95/63 98/64  (!) 91/59  Pulse: 81 64 62 60  Resp: 18 16 17 18   Temp: 98.4 F (36.9 C) 98.6 F (37 C) 98.4 F (36.9 C) 98 F (36.7 C)  TempSrc: Oral Oral Oral Oral  SpO2: 98% 96% 97% 96%  Weight:      Height:       Physical Exam HENT:     Head: Normocephalic.     Mouth/Throat:     Pharynx: No oropharyngeal exudate.  Eyes:     General: Lids are normal.     Conjunctiva/sclera: Conjunctivae normal.  Cardiovascular:     Rate and Rhythm: Normal rate and regular rhythm.     Heart sounds: Normal heart sounds, S1 normal and S2 normal.  Pulmonary:     Breath sounds: No decreased breath sounds, wheezing, rhonchi or rales.  Abdominal:     Palpations: Abdomen is soft.     Tenderness: There is no abdominal tenderness.  Musculoskeletal:     Right lower leg:  No swelling.     Left lower leg: No swelling.  Skin:    General: Skin is warm.     Comments: Right back couple areas where the skin turned blackish with a heating pad.   Neurological:     Mental Status: He is alert and oriented to person, place, and time.     Comments: Able to straight leg raise bilaterally.     Data Reviewed: Alkaline phosphatase 247, AST 529, ALT 281, total bilirubin 1.6, last hemoglobin 9.3, INR 1.0  Disposition: Status is: Inpatient Remains inpatient appropriate because: Will remain in the hospital for the entire course of IV antibiotics to 04/26/22  Planned Discharge Destination: Home    Time spent: 27 minutes  Author: 04/28/22, MD 04/10/2022 2:05 PM  For on call review [email or web address].

## 2022-04-10 NOTE — Progress Notes (Signed)
ID He has no specific complaints Says he is homeless   O/e awake and alert Patient Vitals for the past 24 hrs:  BP Temp Temp src Pulse Resp SpO2  04/10/22 2021 103/66 98.6 F (37 C) Oral 74 18 97 %  04/10/22 1539 104/72 98.3 F (36.8 C) -- 68 17 96 %  04/10/22 0800 (!) 91/59 98 F (36.7 C) Oral 60 18 96 %  04/10/22 0507 98/64 98.4 F (36.9 C) Oral 62 17 97 %  04/10/22 0050 95/63 98.6 F (37 C) Oral 64 16 96 %   Chest cta Hs s1s2 Abd soft Spinal surgical site- clean  Labs    Latest Ref Rng & Units 04/09/2022    5:48 AM 04/05/2022    5:20 AM 04/04/2022    5:39 AM  CBC  WBC 4.0 - 10.5 K/uL 3.9  3.1  3.1   Hemoglobin 13.0 - 17.0 g/dL 9.3  7.9  7.7   Hematocrit 39.0 - 52.0 % 32.0  27.3  25.6   Platelets 150 - 400 K/uL 327  364  353        Latest Ref Rng & Units 04/10/2022    5:55 AM 04/09/2022    5:48 AM 04/07/2022    5:44 AM  CMP  Glucose 70 - 99 mg/dL 301  601  093   BUN 6 - 20 mg/dL 14  15  15    Creatinine 0.61 - 1.24 mg/dL  2.35  5.73   Sodium 135 - 145 mmol/L 142  139  139   Potassium 3.5 - 5.1 mmol/L 4.8  4.8  4.4   Chloride 98 - 111 mmol/L 110  109  109   CO2 22 - 32 mmol/L 26  25  24    Calcium 8.9 - 10.3 mg/dL 9.2  8.8  8.7   Total Protein 6.5 - 8.1 g/dL 6.6  6.6  6.3   Total Bilirubin 0.3 - 1.2 mg/dL 1.6  1.7  1.4   Alkaline Phos 38 - 126 U/L 247  249  227   AST 15 - 41 U/L 529  434  346   ALT 0 - 44 U/L 281  250  217      Impression/recommendation MSSA thoracic vertebral infection - s/p fusion Pt is on cefazolin - rifampin cannot be added due to acute hepb hepatitis  Acute HepB hepatitis with high Vl and worsening LFT Transaminases have not plateud yet Coag screen okay  Leucopenia due to hepb viral infection-improving  HEPC -spontaneous clearance  Pt is in the hospital getting IV for 6 weeks  04/26/22// as he cannot be sent home with PICC due to substance use Discussed the management with patient and hospitalist

## 2022-04-11 DIAGNOSIS — G062 Extradural and subdural abscess, unspecified: Secondary | ICD-10-CM | POA: Diagnosis not present

## 2022-04-11 DIAGNOSIS — M546 Pain in thoracic spine: Secondary | ICD-10-CM | POA: Diagnosis not present

## 2022-04-11 DIAGNOSIS — M4620 Osteomyelitis of vertebra, site unspecified: Secondary | ICD-10-CM | POA: Diagnosis not present

## 2022-04-11 DIAGNOSIS — M0008 Staphylococcal arthritis, vertebrae: Secondary | ICD-10-CM | POA: Diagnosis not present

## 2022-04-11 MED ORDER — BUPRENORPHINE HCL-NALOXONE HCL 8-2 MG SL SUBL
1.0000 | SUBLINGUAL_TABLET | Freq: Two times a day (BID) | SUBLINGUAL | Status: DC
  Administered 2022-04-12 – 2022-04-26 (×29): 1 via SUBLINGUAL
  Filled 2022-04-11 (×30): qty 1

## 2022-04-11 MED ORDER — HYDROMORPHONE HCL 1 MG/ML IJ SOLN
0.5000 mg | Freq: Four times a day (QID) | INTRAMUSCULAR | Status: DC | PRN
  Administered 2022-04-11 (×2): 0.5 mg via INTRAVENOUS
  Filled 2022-04-11 (×3): qty 1

## 2022-04-11 NOTE — Progress Notes (Signed)
Progress Note   Patient: Derek Dunn [id number]:[id number] DOB: 07/07/1971 DOA: 03/15/2022     27 DOS: the patient was seen and examined on 04/11/2022   Brief hospital course: [name] is a 51 year old male with PMH polysubstance abuse including IV drug use in the past, housing insecurity (incarcerated past 2 months, just released (as of 7/12), hospital admission in May 2023 with MSSA bacteremia secondary to spinal osteomyelitis and epidural abscess, recurrent right inguinal hernia, history of signing out AMA.  He presented to the ED from [facility] on 03/15/2022 with complaints of worsening back pain and recurrent right groin pain.  No reported fevers.  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  Labs were mostly unremarkable including no leukocytosis and normal lactic acid.  Sepsis ruled out.  Patient was started on broad-spectrum IV antibiotic coverage and admitted for further evaluation and management.  Interventional radiology was consulted for abscess aspiration for cultures, performed on 7/12. Infectious disease was also consulted.   Neurosurgery consulted for consideration of washout in the OR, given poor likelihood of full resolution with antibiotics alone. He was taken to the OR for washout on 03/24/22.  Underwent posterior laminectomy T8-9 for evacuation of epidural abscess, posterolateral arthrodesis T7 to T12 with T7-12 posterior instrumentation.   The patient will remain in the hospital to 04/26/2022 for IV antibiotics.  On 04/10/2022 the patient wants to go back on IV Dilaudid.  I explained to the patient that we need to get him off of this.  He is okay to go on Suboxone on Wednesday.  Will need to hold pain medications overnight will restart Suboxone on Wednesday morning.  Assessment and Plan: * Epidural abscess Acute osteomyelitis discitis and  facet joint septic arthritis at T8-9 with epidural abscess. Severe spinal stenosis at T8-T9. Acute septic arthritis at the left L5-S1 facet. History of IV drug use. Prior history MSSA bacteremia. --Initially on IV Cefepime, Vancomycin -- IR consulted, performed CT-guided aspiration for culture on 7/12 --Infectious disease consulted  --Was on nafcillin continuous IV infusion --Antibiotic changed to cefazolin on 7/24 due to rising LFTs --Neurosurgery consulted.  Patient underwent T7-12 posterior instrumentation with decompression and washout of epidural abscess at T8-9 (performed on 03/24/22) -- Drain removed on 03/27/2022  -- Wound VAC removed 7/26 --Neurosurgery recommends: Mobilize, pain control, resume DVT prophylaxis, PT/OT -- Follow-up with neurosurgery in 2 weeks  7/28: per Neurosurgery, pt no longer needs dressing over incision.  May shower and pat incision area dry.  Patient will remain in the hospital for IV antibiotics through 04/26/2022.  Septic arthritis (HCC) Signs of septic arthritis at level of L5-S1 seen on MRI lumbar spine.  Management as outlined, see epidural abscess.  Discitis thoracic region See epidural abscess for plan  Vertebral osteomyelitis (HCC) Management as outlined  Transaminitis Likely secondary to acute hepatitis B infection.    LFTs still on the rise with AST of 529 and ALT of 228, total bilirubin 1.9.  INR 1.0.  Recheck liver function test and INR tomorrow morning.  Back pain Will start Suboxone tomorrow morning.  Will spread out IV Dilaudid today to every 6 hours.  Continue oxycodone today.  Will need to get rid of pain medications at 1 AM in order to do the Suboxone in the morning.  Fever Temp 100.4 noted on evening of 7/26. Secondary to acute hepatitis B infection  Acute hepatitis B  Acute hepatitis B infection with viral DNA greater than 1 billion.  Need to check antibodies as outpatient to see if he clears this infection.  Follow-up with GI as  outpatient.  Right inguinal hernia -repeat CT abd/pelvis on the 28th showed a persistent large right inguinal hernia with no evidence of bowel obstruction.  Hypotension BP as low as 87/61 on AM of 7/13, suspect due to pain medications.  Otherwise no signs of sepsis.   Insomnia Trazodone at bedtime ordered.  Stage II decubitus ulcer (HCC) Right buttock.  Advise changing positions and ambulating.  This area now has a scab on it and looking good.  No signs of infection  Anemia of chronic disease Last hemoglobin 9.3.  Ferritin elevated above 300.  Hepatitis C antibody test positive HCV Ab reactive this admission.  Hepatitis C RNA not detected.  Likely has cleared this infection.        Subjective: Patient okay with switching to Suboxone tomorrow morning.  Still has quite a bit of back pain and burning even to light touch.  Admitted 27 days ago with epidural abscess.  Physical Exam: Vitals:   04/10/22 1539 04/10/22 2021 04/11/22 0432 04/11/22 0725  BP: 104/72 103/66 98/72 97/71   Pulse: 68 74 62 66  Resp: 17 18 18 18   Temp: 98.3 F (36.8 C) 98.6 F (37 C) 97.6 F (36.4 C) 97.7 F (36.5 C)  TempSrc:  Oral Oral   SpO2: 96% 97%  96%  Weight:      Height:       Physical Exam HENT:     Head: Normocephalic.     Mouth/Throat:     Pharynx: No oropharyngeal exudate.  Eyes:     General: Lids are normal.     Conjunctiva/sclera: Conjunctivae normal.  Cardiovascular:     Rate and Rhythm: Normal rate and regular rhythm.     Heart sounds: Normal heart sounds, S1 normal and S2 normal.  Pulmonary:     Breath sounds: No decreased breath sounds, wheezing, rhonchi or rales.  Abdominal:     Palpations: Abdomen is soft.     Tenderness: There is no abdominal tenderness.  Musculoskeletal:     Right lower leg: No swelling.     Left lower leg: No swelling.  Skin:    General: Skin is warm.     Comments: Right back couple areas where the skin turned blackish with a heating pad.    Neurological:     Mental Status: He is alert and oriented to person, place, and time.     Comments: Able to straight leg raise bilaterally.     Data Reviewed: No blood work today will recheck tomorrow.  Disposition: Status is: Inpatient Remains inpatient appropriate because: As per infectious diseases, will remain in the hospital for entire course of IV antibiotics to 04/26/2022  Planned Discharge Destination: Home    Time spent: 27 minutes  Author: 04/28/2022, MD 04/11/2022 1:21 PM  For on call review [email or web address].

## 2022-04-12 DIAGNOSIS — G062 Extradural and subdural abscess, unspecified: Secondary | ICD-10-CM | POA: Diagnosis not present

## 2022-04-12 DIAGNOSIS — B169 Acute hepatitis B without delta-agent and without hepatic coma: Secondary | ICD-10-CM | POA: Diagnosis not present

## 2022-04-12 DIAGNOSIS — M4620 Osteomyelitis of vertebra, site unspecified: Secondary | ICD-10-CM | POA: Diagnosis not present

## 2022-04-12 DIAGNOSIS — M0008 Staphylococcal arthritis, vertebrae: Secondary | ICD-10-CM | POA: Diagnosis not present

## 2022-04-12 LAB — COMPREHENSIVE METABOLIC PANEL
ALT: 326 U/L — ABNORMAL HIGH (ref 0–44)
AST: 621 U/L — ABNORMAL HIGH (ref 15–41)
Albumin: 2.7 g/dL — ABNORMAL LOW (ref 3.5–5.0)
Alkaline Phosphatase: 221 U/L — ABNORMAL HIGH (ref 38–126)
Anion gap: 4 — ABNORMAL LOW (ref 5–15)
BUN: 15 mg/dL (ref 6–20)
CO2: 25 mmol/L (ref 22–32)
Calcium: 8.8 mg/dL — ABNORMAL LOW (ref 8.9–10.3)
Chloride: 111 mmol/L (ref 98–111)
Creatinine, Ser: 0.89 mg/dL (ref 0.61–1.24)
GFR, Estimated: >60 mL/min (ref >60)
Glucose, Bld: 96 mg/dL (ref 70–99)
Potassium: 4.3 mmol/L (ref 3.5–5.1)
Sodium: 140 mmol/L (ref 135–145)
Total Bilirubin: 1.8 mg/dL — ABNORMAL HIGH (ref 0.3–1.2)
Total Protein: 6.6 g/dL (ref 6.5–8.1)

## 2022-04-12 LAB — HBV QUANT PCR RFX TO GENOTYPE
HBV IU/mL: >1000000000 IU/mL
log10 HBV as IU/mL: UNDETERMINED log10 IU/mL

## 2022-04-12 LAB — PROTIME-INR
INR: 1.1 (ref 0.8–1.2)
Prothrombin Time: 13.6 seconds (ref 11.4–15.2)

## 2022-04-12 LAB — HBV GENOTYPE (REFLEXED)

## 2022-04-12 MED ORDER — CARBAMIDE PEROXIDE 6.5 % OT SOLN
5.0000 [drp] | Freq: Two times a day (BID) | OTIC | Status: DC
  Administered 2022-04-12 – 2022-04-14 (×3): 5 [drp] via OTIC
  Filled 2022-04-12: qty 15

## 2022-04-12 NOTE — Progress Notes (Signed)
Mobility Specialist - Progress Note   04/12/22 1245  Mobility  Activity Ambulated independently in hallway  Level of Assistance Independent  Assistive Device None  Distance Ambulated (ft) 600 ft  Activity Response Tolerated well  $Mobility charge 1 Mobility    Clarisa Schools Mobility Specialist 04/12/22, 12:45 PM

## 2022-04-12 NOTE — Progress Notes (Signed)
Mobility Specialist - Progress Note    04/12/22 1521  Mobility  Activity Ambulated independently in hallway  Level of Assistance Independent  Assistive Device None  Distance Ambulated (ft) 480 ft  Activity Response Tolerated well  $Mobility charge 1 Mobility   Pt ambulates around NS indep.   Terrilyn Saver  Mobility Specialist  04/12/22 3:23 PM

## 2022-04-12 NOTE — Progress Notes (Signed)
Mobility Specialist - Progress Note    04/12/22 0800  Mobility  Activity Ambulated independently in hallway  Level of Assistance Independent  Assistive Device None  Distance Ambulated (ft) 400 ft  Activity Response Tolerated well  $Mobility charge 1 Mobility    Clarisa Schools Mobility Specialist 04/12/22, 8:17 AM

## 2022-04-12 NOTE — Progress Notes (Signed)
Progress Note   Patient: Derek Dunn LOV:[id number] DOB: October 12, 1970 DOA: 03/15/2022     28 DOS: the patient was seen and examined on 04/12/2022   Brief hospital course: Derek Dunn is a 51 year old male with PMH polysubstance abuse including IV drug use in the past, housing insecurity (incarcerated past 2 months, just released (as of 7/12), hospital admission in May 2023 with MSSA bacteremia secondary to spinal osteomyelitis and epidural abscess, recurrent right inguinal hernia, history of signing out AMA.  He presented to the ED from [facility] on 03/15/2022 with complaints of worsening back pain and recurrent right groin pain.  No reported fevers.  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  Labs were mostly unremarkable including no leukocytosis and normal lactic acid.  Sepsis ruled out.  Patient was started on broad-spectrum IV antibiotic coverage and admitted for further evaluation and management.  Interventional radiology was consulted for abscess aspiration for cultures, performed on 7/12. Infectious disease was also consulted.   Neurosurgery consulted for consideration of washout in the OR, given poor likelihood of full resolution with antibiotics alone. He was taken to the OR for washout on 03/24/22.  Underwent posterior laminectomy T8-9 for evacuation of epidural abscess, posterolateral arthrodesis T7 to T12 with T7-12 posterior instrumentation.   The patient will remain in the hospital to 04/26/2022 for IV antibiotics.  Patient converted to Suboxone on 04/12/2022.  Assessment and Plan: * Epidural abscess Acute osteomyelitis discitis and facet joint septic arthritis at T8-9 with epidural abscess. Severe spinal stenosis at T8-T9. Acute septic arthritis at the left L5-S1 facet. History of IV drug use. Prior history MSSA bacteremia. --Initially on  IV Cefepime, Vancomycin -- IR consulted, performed CT-guided aspiration for culture on 7/12 --Infectious disease consulted  --Was on nafcillin continuous IV infusion --Antibiotic changed to cefazolin on 7/24 due to rising LFTs --Neurosurgery consulted.  Patient underwent T7-12 posterior instrumentation with decompression and washout of epidural abscess at T8-9 (performed on 03/24/22) -- Drain removed on 03/27/2022  -- Wound VAC removed 7/26 --Neurosurgery recommends: Mobilize, pain control, resume DVT prophylaxis, PT/OT -- Follow-up with neurosurgery in 2 weeks  7/28: per Neurosurgery, pt no longer needs dressing over incision.  May shower and pat incision area dry.  Patient will remain in the hospital for IV antibiotics through 04/26/2022.  Septic arthritis (HCC) Signs of septic arthritis at level of L5-S1 seen on MRI lumbar spine.  Management as outlined, see epidural abscess.  Discitis thoracic region See epidural abscess for plan  Vertebral osteomyelitis (HCC) Management as outlined  Transaminitis Likely secondary to acute hepatitis B infection.    LFTs still on the rise with AST of 621, ALT 326, INR 1.1.  Back pain Patient converted to Suboxone on 04/12/2022.  Will need an outpatient Suboxone clinic upon disposition.  This was discussed with TOC.  Fever Temp 100.4 noted on evening of 7/26. Secondary to acute hepatitis B infection  Acute hepatitis B Acute hepatitis B infection with viral DNA greater than 1 billion.  Need to check antibodies as outpatient to see if he clears this infection.  Follow-up with GI as outpatient.  Right inguinal hernia -repeat CT abd/pelvis on the 28th showed a persistent large right inguinal hernia with no evidence of bowel obstruction.  Advised he will need general surgery as outpatient.  Hypotension BP as low as 87/61 on AM of 7/13, suspect due to pain medications.  Otherwise no signs of sepsis.   Insomnia Trazodone at bedtime  ordered.  Stage II decubitus ulcer (HCC) Right buttock.  Advise changing positions and ambulating.  This area now has a scab on it and looking good.  No signs of infection  Anemia of chronic disease Last hemoglobin 9.3.  Ferritin elevated above 300.  Hepatitis C antibody test positive HCV Ab reactive this admission.  Hepatitis C RNA not detected.  Likely has cleared this infection.        Subjective: Patient received his suboxone this am, the conversion off other meds last night was tough.  Physical Exam: Vitals:   04/11/22 1526 04/11/22 2107 04/12/22 0530 04/12/22 0859  BP: 107/72 100/68 107/74 118/72  Pulse: 60 65 (!) 52 61  Resp: 18 18 18 17   Temp: 98.1 F (36.7 C) 98.6 F (37 C) 97.6 F (36.4 C) 97.8 F (36.6 C)  TempSrc:    Oral  SpO2: 96% 96% 97% 95%  Weight:      Height:       Physical Exam HENT:     Head: Normocephalic.     Mouth/Throat:     Pharynx: No oropharyngeal exudate.  Eyes:     General: Lids are normal.     Conjunctiva/sclera: Conjunctivae normal.  Cardiovascular:     Rate and Rhythm: Normal rate and regular rhythm.     Heart sounds: Normal heart sounds, S1 normal and S2 normal.  Pulmonary:     Breath sounds: No decreased breath sounds, wheezing, rhonchi or rales.  Abdominal:     Palpations: Abdomen is soft.     Tenderness: There is no abdominal tenderness.  Musculoskeletal:     Right lower leg: No swelling.     Left lower leg: No swelling.  Skin:    General: Skin is warm.     Comments: Right back couple areas where the skin turned blackish with a heating pad.   Neurological:     Mental Status: He is alert and oriented to person, place, and time.     Comments: Able to straight leg raise bilaterally.     Data Reviewed: Ast 621, alt 326, Inr 1.1  Disposition: Status is: Inpatient Remains inpatient appropriate because: Need IV antibiotics to complete the course to 04/26/2022  Planned Discharge Destination: Home    Time spent: 27  minutes  Author: 04/28/2022, MD 04/12/2022 3:37 PM  For on call review [email or web address].

## 2022-04-13 DIAGNOSIS — B169 Acute hepatitis B without delta-agent and without hepatic coma: Secondary | ICD-10-CM | POA: Diagnosis not present

## 2022-04-13 DIAGNOSIS — M4620 Osteomyelitis of vertebra, site unspecified: Secondary | ICD-10-CM | POA: Diagnosis not present

## 2022-04-13 DIAGNOSIS — B9561 Methicillin susceptible Staphylococcus aureus infection as the cause of diseases classified elsewhere: Secondary | ICD-10-CM | POA: Diagnosis not present

## 2022-04-13 DIAGNOSIS — M0008 Staphylococcal arthritis, vertebrae: Secondary | ICD-10-CM | POA: Diagnosis not present

## 2022-04-13 DIAGNOSIS — G062 Extradural and subdural abscess, unspecified: Secondary | ICD-10-CM | POA: Diagnosis not present

## 2022-04-13 DIAGNOSIS — M4624 Osteomyelitis of vertebra, thoracic region: Secondary | ICD-10-CM | POA: Diagnosis not present

## 2022-04-13 MED ORDER — CLOTRIMAZOLE 1 % EX CREA
1.0000 | TOPICAL_CREAM | Freq: Two times a day (BID) | CUTANEOUS | Status: DC
  Administered 2022-04-13 – 2022-04-25 (×16): 1 via TOPICAL
  Filled 2022-04-13 (×3): qty 15

## 2022-04-13 NOTE — Progress Notes (Signed)
ID He has no specific complaints Doing okay Back pain okay  O/e awake and alert Patient Vitals for the past 24 hrs:  BP Temp Temp src Pulse Resp SpO2  04/13/22 1543 111/78 98.5 F (36.9 C) -- 80 16 98 %  04/13/22 0805 97/69 98.1 F (36.7 C) -- 67 16 100 %  04/13/22 0436 95/67 98 F (36.7 C) Oral 61 16 97 %  04/12/22 2007 114/77 98 F (36.7 C) -- 81 18 100 %   Chest cta Hs s1s2 Abd soft Spinal surgical site- clean  Labs    Latest Ref Rng & Units 04/09/2022    5:48 AM 04/05/2022    5:20 AM 04/04/2022    5:39 AM  CBC  WBC 4.0 - 10.5 K/uL 3.9  3.1  3.1   Hemoglobin 13.0 - 17.0 g/dL 9.3  7.9  7.7   Hematocrit 39.0 - 52.0 % 32.0  27.3  25.6   Platelets 150 - 400 K/uL 327  364  353        Latest Ref Rng & Units 04/12/2022    5:15 AM 04/10/2022    5:55 AM 04/09/2022    5:48 AM  CMP  Glucose 70 - 99 mg/dL 96  030  092   BUN 6 - 20 mg/dL 15  14  15    Creatinine 0.61 - 1.24 mg/dL  3.30  0.76   Sodium 135 - 145 mmol/L 140  142  139   Potassium 3.5 - 5.1 mmol/L 4.3  4.8  4.8   Chloride 98 - 111 mmol/L 111  110  109   CO2 22 - 32 mmol/L 25  26  25    Calcium 8.9 - 10.3 mg/dL 8.8  9.2  8.8   Total Protein 6.5 - 8.1 g/dL 6.6  6.6  6.6   Total Bilirubin 0.3 - 1.2 mg/dL 1.8  1.6  1.7   Alkaline Phos 38 - 126 U/L 221  247  249   AST 15 - 41 U/L 621  529  434   ALT 0 - 44 U/L 326  281  250      Impression/recommendation MSSA thoracic vertebral infection - s/p fusion Pt is on cefazolin -  rifampin cannot be added due to acute hepb hepatitis  Acute HepB hepatitis with high Vl and worsening LFT Transaminases have not plateud yet Coag screen okay  Leucopenia due to hepb viral infection-improving  HEPC -spontaneous clearance  Pt is in the hospital getting IV for 6 weeks  04/26/22// as he cannot be sent home with PICC due to substance use Discussed the management with patient and hospitalist  [facility] covering Friday/Saturday and sunday

## 2022-04-13 NOTE — Progress Notes (Signed)
Progress Note   Patient: Derek Dunn OEV:[id number] DOB: 1971/08/15 DOA: 03/15/2022     29 DOS: the patient was seen and examined on 04/13/2022   Brief hospital course: Derek Dunn is a 51 year old male with PMH polysubstance abuse including IV drug use in the past, housing insecurity (incarcerated past 2 months, just released (as of 7/12), hospital admission in May 2023 with MSSA bacteremia secondary to spinal osteomyelitis and epidural abscess, recurrent right inguinal hernia, history of signing out AMA.  He presented to the ED from [facility] on 03/15/2022 with complaints of worsening back pain and recurrent right groin pain.  No reported fevers.  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  Labs were mostly unremarkable including no leukocytosis and normal lactic acid.  Sepsis ruled out.  Patient was started on broad-spectrum IV antibiotic coverage and admitted for further evaluation and management.  Interventional radiology was consulted for abscess aspiration for cultures, performed on 7/12. Infectious disease was also consulted.   Neurosurgery consulted for consideration of washout in the OR, given poor likelihood of full resolution with antibiotics alone. He was taken to the OR for washout on 03/24/22.  Underwent posterior laminectomy T8-9 for evacuation of epidural abscess, posterolateral arthrodesis T7 to T12 with T7-12 posterior instrumentation.   The patient will remain in the hospital to 04/26/2022 for IV antibiotics.  Patient converted to Suboxone on 04/12/2022.  Assessment and Plan: * Epidural abscess Acute osteomyelitis discitis and facet joint septic arthritis at T8-9 with epidural abscess. Severe spinal stenosis at T8-T9. Acute septic arthritis at the left L5-S1 facet. History of IV drug use. Prior history MSSA bacteremia. --Initially on  IV Cefepime, Vancomycin -- IR consulted, performed CT-guided aspiration for culture on 7/12 --Infectious disease consulted  --Was on nafcillin continuous IV infusion --Antibiotic changed to cefazolin on 7/24 due to rising LFTs --Neurosurgery consulted.  Patient underwent T7-12 posterior instrumentation with decompression and washout of epidural abscess at T8-9 (performed on 03/24/22) -- Drain removed on 03/27/2022  -- Wound VAC removed 7/26 --Neurosurgery recommends: Mobilize, pain control, resume DVT prophylaxis, PT/OT -- Follow-up with neurosurgery in 2 weeks  7/28: per Neurosurgery, pt no longer needs dressing over incision.  May shower and pat incision area dry.  Patient will remain in the hospital for IV antibiotics through 04/26/2022.  Septic arthritis (HCC) Signs of septic arthritis at level of L5-S1 seen on MRI lumbar spine.  Management as outlined, see epidural abscess.  Discitis thoracic region See epidural abscess for plan  Vertebral osteomyelitis (HCC) Management as outlined  Transaminitis Likely secondary to acute hepatitis B infection. LFTs still on the rise with AST of 621, ALT 326, INR 1.1.  Check liver function test tomorrow.  Back pain Patient converted to Suboxone on 04/12/2022.  Will need an outpatient Suboxone clinic upon disposition.  This was discussed with TOC.  Fever Temp 100.4 noted on evening of 7/26. Secondary to acute hepatitis B infection  Acute hepatitis B Acute hepatitis B infection with viral DNA greater than 1 billion.  Need to check antibodies as outpatient to see if he clears this infection.  Follow-up with GI as outpatient.  Right inguinal hernia -repeat CT abd/pelvis on the 28th showed a persistent large right inguinal hernia with no evidence of bowel obstruction.  Advised he will need general surgery as outpatient.  Hypotension BP as low as 87/61 on AM of 7/13, suspect due  to pain medications.  Otherwise no signs of  sepsis.   Insomnia Trazodone at bedtime ordered.  Stage II decubitus ulcer (HCC) Right buttock.  This has healed.  Anemia of chronic disease Last hemoglobin 9.3.  Ferritin elevated above 300.  Hepatitis C antibody test positive HCV Ab reactive this admission.  Hepatitis C RNA not detected.  Likely has cleared this infection.        Subjective: Patient asking for Suboxone to be converted to 6 AM and 6 PM.  Initially asked for it to be given 3 times a day.  Still has some back pain.  States last night after speaking to his and his blood pressure went high.  Physical Exam: Vitals:   04/12/22 2007 04/13/22 0436 04/13/22 0805 04/13/22 1543  BP: 114/77 95/67 97/69  111/78  Pulse: 81 61 67 80  Resp: 18 16 16 16   Temp: 98 F (36.7 C) 98 F (36.7 C) 98.1 F (36.7 C) 98.5 F (36.9 C)  TempSrc:  Oral    SpO2: 100% 97% 100% 98%  Weight:      Height:       Physical Exam HENT:     Head: Normocephalic.     Mouth/Throat:     Pharynx: No oropharyngeal exudate.  Eyes:     General: Lids are normal.     Conjunctiva/sclera: Conjunctivae normal.  Cardiovascular:     Rate and Rhythm: Normal rate and regular rhythm.     Heart sounds: Normal heart sounds, S1 normal and S2 normal.  Pulmonary:     Breath sounds: No decreased breath sounds, wheezing, rhonchi or rales.  Abdominal:     Palpations: Abdomen is soft.     Tenderness: There is no abdominal tenderness.  Musculoskeletal:     Right lower leg: No swelling.     Left lower leg: No swelling.  Skin:    General: Skin is warm.     Comments: Right back couple areas where the skin turned blackish with a heating pad.  Stage II decubiti healed.   Neurological:     Mental Status: He is alert and oriented to person, place, and time.     Data Reviewed: Last AST 621 last ALT 326  Disposition: Status is: Inpatient Remains inpatient appropriate because: Will remain in the hospital for IV antibiotics to 04/26/2022  Planned Discharge  Destination: Home    Time spent: 26 minutes  Author: 04/28/2022, MD 04/13/2022 4:48 PM  For on call review [email or web address].

## 2022-04-14 DIAGNOSIS — G062 Extradural and subdural abscess, unspecified: Secondary | ICD-10-CM | POA: Diagnosis not present

## 2022-04-14 LAB — CBC WITH DIFFERENTIAL/PLATELET
Abs Immature Granulocytes: 0 10*3/uL (ref 0.00–0.07)
Basophils Absolute: 0 10*3/uL (ref 0.0–0.1)
Basophils Relative: 1 %
Eosinophils Absolute: 0.1 10*3/uL (ref 0.0–0.5)
Eosinophils Relative: 3 %
HCT: 34.6 % — ABNORMAL LOW (ref 39.0–52.0)
Hemoglobin: 10.4 g/dL — ABNORMAL LOW (ref 13.0–17.0)
Immature Granulocytes: 0 %
Lymphocytes Relative: 33 %
Lymphs Abs: 0.9 10*3/uL (ref 0.7–4.0)
MCH: 26.7 pg (ref 26.0–34.0)
MCHC: 30.1 g/dL (ref 30.0–36.0)
MCV: 88.7 fL (ref 80.0–100.0)
Monocytes Absolute: 0.3 10*3/uL (ref 0.1–1.0)
Monocytes Relative: 11 %
Neutro Abs: 1.4 10*3/uL — ABNORMAL LOW (ref 1.7–7.7)
Neutrophils Relative %: 52 %
Platelets: 242 10*3/uL (ref 150–400)
RBC: 3.9 MIL/uL — ABNORMAL LOW (ref 4.22–5.81)
RDW: 19.9 % — ABNORMAL HIGH (ref 11.5–15.5)
WBC: 2.7 10*3/uL — ABNORMAL LOW (ref 4.0–10.5)
nRBC: 0 % (ref 0.0–0.2)

## 2022-04-14 LAB — COMPREHENSIVE METABOLIC PANEL
ALT: 390 U/L — ABNORMAL HIGH (ref 0–44)
AST: 698 U/L — ABNORMAL HIGH (ref 15–41)
Albumin: 2.9 g/dL — ABNORMAL LOW (ref 3.5–5.0)
Alkaline Phosphatase: 224 U/L — ABNORMAL HIGH (ref 38–126)
Anion gap: 4 — ABNORMAL LOW (ref 5–15)
BUN: 15 mg/dL (ref 6–20)
CO2: 24 mmol/L (ref 22–32)
Calcium: 9.1 mg/dL (ref 8.9–10.3)
Chloride: 111 mmol/L (ref 98–111)
Creatinine, Ser: 0.83 mg/dL (ref 0.61–1.24)
GFR, Estimated: >60 mL/min (ref >60)
Glucose, Bld: 109 mg/dL — ABNORMAL HIGH (ref 70–99)
Potassium: 4.2 mmol/L (ref 3.5–5.1)
Sodium: 139 mmol/L (ref 135–145)
Total Bilirubin: 2 mg/dL — ABNORMAL HIGH (ref 0.3–1.2)
Total Protein: 7 g/dL (ref 6.5–8.1)

## 2022-04-14 LAB — PROTIME-INR
INR: 1 (ref 0.8–1.2)
Prothrombin Time: 13.5 seconds (ref 11.4–15.2)

## 2022-04-14 NOTE — Progress Notes (Signed)
Progress Note   Patient: Derek Dunn QVZ:[id number] DOB: October 18, 1970 DOA: 03/15/2022     30 DOS: the patient was seen and examined on 04/14/2022   Brief hospital course: Mr. Derek Dunn is a 51 year old male with PMH polysubstance abuse including IV drug use in the past, housing insecurity (incarcerated past 2 months, just released (as of 7/12), hospital admission in May 2023 with MSSA bacteremia secondary to spinal osteomyelitis and epidural abscess, recurrent right inguinal hernia, history of signing out AMA.  He presented to the ED from [facility] on 03/15/2022 with complaints of worsening back pain and recurrent right groin pain.  No reported fevers.  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  Labs were mostly unremarkable including no leukocytosis and normal lactic acid.  Sepsis ruled out.  Patient was started on broad-spectrum IV antibiotic coverage and admitted for further evaluation and management.  Interventional radiology was consulted for abscess aspiration for cultures, performed on 7/12. Infectious disease was also consulted.   Neurosurgery consulted for consideration of washout in the OR, given poor likelihood of full resolution with antibiotics alone. He was taken to the OR for washout on 03/24/22.  Underwent posterior laminectomy T8-9 for evacuation of epidural abscess, posterolateral arthrodesis T7 to T12 with T7-12 posterior instrumentation.   The patient will remain in the hospital to 04/26/2022 for IV antibiotics.  Patient converted to Suboxone on 04/12/2022.  Assessment and Plan: * Epidural abscess Acute osteomyelitis discitis and facet joint septic arthritis at T8-9 with epidural abscess. Severe spinal stenosis at T8-T9. Acute septic arthritis at the left L5-S1 facet. History of IV drug use. Prior history MSSA bacteremia. --Initially on  IV Cefepime, Vancomycin -- IR consulted, performed CT-guided aspiration for culture on 7/12 --Infectious disease consulted  --Was on nafcillin continuous IV infusion --Antibiotic changed to cefazolin on 7/24 due to rising LFTs --Neurosurgery consulted.  Patient underwent T7-12 posterior instrumentation with decompression and washout of epidural abscess at T8-9 (performed on 03/24/22) -- Drain removed on 03/27/2022  -- Wound VAC removed 7/26 --Neurosurgery recommends: Mobilize, pain control, resume DVT prophylaxis, PT/OT -- Follow-up with neurosurgery in 2 weeks  7/28: per Neurosurgery, pt no longer needs dressing over incision.  May shower and pat incision area dry.  Patient will remain in the hospital for IV antibiotics through 04/26/2022.  Septic arthritis (HCC) Signs of septic arthritis at level of L5-S1 seen on MRI lumbar spine.  Management as outlined, see epidural abscess.  Discitis thoracic region See epidural abscess for plan  Vertebral osteomyelitis (HCC) Management as outlined  Transaminitis Likely secondary to acute hepatitis B infection. LFTs still on the rise with AST of 621, ALT 326, INR 1.1.  Check liver function test tomorrow.  Back pain Patient converted to Suboxone on 04/12/2022.  Will need an outpatient Suboxone clinic upon disposition.  This was discussed with TOC.  Fever Temp 100.4 noted on evening of 7/26. Secondary to acute hepatitis B infection  Acute hepatitis B Acute hepatitis B infection with viral DNA greater than 1 billion.  Need to check antibodies as outpatient to see if he clears this infection.  Follow-up with GI as outpatient.  Right inguinal hernia -repeat CT abd/pelvis on the 28th showed a persistent large right inguinal hernia with no evidence of bowel obstruction.  Advised he will need general surgery as outpatient.  Hypotension BP as low as 87/61 on AM of 7/13, suspect due  to pain medications.  Otherwise no signs of  sepsis.   Insomnia Trazodone at bedtime ordered.  Stage II decubitus ulcer (HCC) Right buttock.  This has healed.  Anemia of chronic disease Last hemoglobin 9.3.  Ferritin elevated above 300.  Hepatitis C antibody test positive HCV Ab reactive this admission.  Hepatitis C RNA not detected.  Likely has cleared this infection.        Subjective:  Reported still having pain, but controlled.  Walking around the halls, but the hernia bothers him when walking.  Eating well.  Physical Exam: Vitals:   04/13/22 2042 04/14/22 0456 04/14/22 0830 04/14/22 1611  BP: 110/73 112/72 102/76 119/75  Pulse: 76 67 62 80  Resp: 17 18 18 18   Temp: 98.3 F (36.8 C) 97.7 F (36.5 C) 98 F (36.7 C) 98.1 F (36.7 C)  TempSrc:      SpO2: 100% 96% 97% 100%  Weight:      Height:       Constitutional: NAD, AAOx3 HEENT: conjunctivae and lids normal, EOMI CV: No cyanosis.   RESP: normal respiratory effort, on RA Extremities: No effusions, edema in BLE SKIN: warm, dry Neuro: II - XII grossly intact.   Psych: Normal mood and affect.  Appropriate judgement and reason   Data Reviewed:   Disposition: Status is: Inpatient Remains inpatient appropriate because: Will remain in the hospital for IV antibiotics to 04/26/2022  Planned Discharge Destination: Home    Time spent: 25 minutes  Author: 04/28/2022, MD 04/14/2022 5:31 PM  For on call review [email or web address].

## 2022-04-14 NOTE — Progress Notes (Signed)
Mobility Specialist - Progress Note   04/14/22 0800  Mobility  Activity Ambulated independently in hallway  Level of Assistance Independent  Assistive Device None  Distance Ambulated (ft) 600 ft  Activity Response Tolerated well  $Mobility charge 1 Mobility    Pt ambulates in hallway indep. Does voice pain from hernia but tolerates well.   Clarisa Schools Mobility Specialist 04/14/22, 8:12 AM

## 2022-04-14 NOTE — Progress Notes (Signed)
Mobility Specialist - Progress Note    04/14/22 0900  Mobility  Activity Ambulated independently in hallway  Level of Assistance Independent  Assistive Device None  Distance Ambulated (ft) 500 ft  Activity Response Tolerated well  $Mobility charge 1 Mobility   Derek Dunn Mobility Specialist 04/14/22, 9:36 AM;

## 2022-04-15 DIAGNOSIS — G062 Extradural and subdural abscess, unspecified: Secondary | ICD-10-CM | POA: Diagnosis not present

## 2022-04-15 NOTE — Progress Notes (Signed)
Progress Note   Patient: Derek Dunn [id number]:[id number] DOB: 10-Jun-1971 DOA: 03/15/2022     31 DOS: the patient was seen and examined on 04/15/2022   Brief hospital course: Derek Dunn is a 51 year old male with PMH polysubstance abuse including IV drug use in the past, housing insecurity (incarcerated past 2 months, just released (as of 7/12), hospital admission in May 2023 with MSSA bacteremia secondary to spinal osteomyelitis and epidural abscess, recurrent right inguinal hernia, history of signing out AMA.  He presented to the ED from [facility] on 03/15/2022 with complaints of worsening back pain and recurrent right groin pain.  No reported fevers.  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  Labs were mostly unremarkable including no leukocytosis and normal lactic acid.  Sepsis ruled out.  Patient was started on broad-spectrum IV antibiotic coverage and admitted for further evaluation and management.  Interventional radiology was consulted for abscess aspiration for cultures, performed on 7/12. Infectious disease was also consulted.   Neurosurgery consulted for consideration of washout in the OR, given poor likelihood of full resolution with antibiotics alone. He was taken to the OR for washout on 03/24/22.  Underwent posterior laminectomy T8-9 for evacuation of epidural abscess, posterolateral arthrodesis T7 to T12 with T7-12 posterior instrumentation.   The patient will remain in the hospital to 04/26/2022 for IV antibiotics.  Patient converted to Suboxone on 04/12/2022.  Assessment and Plan: * Epidural abscess Acute osteomyelitis discitis and facet joint septic arthritis at T8-9 with epidural abscess. Severe spinal stenosis at T8-T9. Acute septic arthritis at the left L5-S1 facet. History of IV drug use. Prior history MSSA bacteremia. --Initially on  IV Cefepime, Vancomycin -- IR consulted, performed CT-guided aspiration for culture on 7/12 --Infectious disease consulted  --Was on nafcillin continuous IV infusion --Antibiotic changed to cefazolin on 7/24 due to rising LFTs --Neurosurgery consulted.  Patient underwent T7-12 posterior instrumentation with decompression and washout of epidural abscess at T8-9 (performed on 03/24/22) -- Drain removed on 03/27/2022  -- Wound VAC removed 7/26 --Neurosurgery recommends: Mobilize, pain control, resume DVT prophylaxis, PT/OT -- Follow-up with neurosurgery in 2 weeks  7/28: per Neurosurgery, pt no longer needs dressing over incision.  May shower and pat incision area dry.  Patient will remain in the hospital for IV antibiotics through 04/26/2022.  Septic arthritis (HCC) Signs of septic arthritis at level of L5-S1 seen on MRI lumbar spine.  Management as outlined, see epidural abscess.  Discitis thoracic region See epidural abscess for plan  Vertebral osteomyelitis (HCC) Management as outlined  Transaminitis Likely secondary to acute hepatitis B infection. LFTs still on the rise with AST of 621, ALT 326, INR 1.1.  Check liver function test tomorrow.  Back pain Patient converted to Suboxone on 04/12/2022.  Will need an outpatient Suboxone clinic upon disposition.  This was discussed with TOC.  Fever Temp 100.4 noted on evening of 7/26. Secondary to acute hepatitis B infection  Acute hepatitis B Acute hepatitis B infection with viral DNA greater than 1 billion.  Need to check antibodies as outpatient to see if he clears this infection.  Follow-up with GI as outpatient.  Right inguinal hernia -repeat CT abd/pelvis on the 28th showed a persistent large right inguinal hernia with no evidence of bowel obstruction.  Advised he will need general surgery as outpatient. --order hernia groin truss today   Hypotension BP as low as 87/61  on AM of 7/13, suspect due to pain medications.  Otherwise no  signs of sepsis.   Insomnia Trazodone at bedtime ordered.  Stage II decubitus ulcer (HCC) Right buttock.  This has healed.  Anemia of chronic disease Last hemoglobin 9.3.  Ferritin elevated above 300.  Hepatitis C antibody test positive HCV Ab reactive this admission.  Hepatitis C RNA not detected.  Likely has cleared this infection.        Subjective:  Pt was seen walking the halls.  No new complaints.  Ordered hernia groin truss today.   Physical Exam: Vitals:   04/14/22 2012 04/15/22 0434 04/15/22 0818 04/15/22 1815  BP: 125/83 115/84 110/77 109/80  Pulse: 86 76 79 84  Resp: 20 20 18    Temp: 98.7 F (37.1 C) 98.2 F (36.8 C) 97.6 F (36.4 C) 98.6 F (37 C)  TempSrc: Oral Oral    SpO2: 98% 98% 97% 100%  Weight:      Height:       Constitutional: NAD, AAOx3 HEENT: conjunctivae and lids normal, EOMI CV: No cyanosis.   RESP: normal respiratory effort, on RA Neuro: II - XII grossly intact.   Psych: Normal mood and affect.  Appropriate judgement and reason    Data Reviewed:   Disposition: Status is: Inpatient Remains inpatient appropriate because: Will remain in the hospital for IV antibiotics to 04/26/2022  Planned Discharge Destination: Home    Time spent: 25 minutes  Author: 04/28/2022, MD 04/15/2022 6:59 PM  For on call review [email or web address].

## 2022-04-15 NOTE — Progress Notes (Signed)
Dr Fran Lowes made aware that pt refuses lovenox injections, but pt does ambulate very frequently in the hallway and in his room, acknowledged

## 2022-04-16 DIAGNOSIS — G062 Extradural and subdural abscess, unspecified: Secondary | ICD-10-CM | POA: Diagnosis not present

## 2022-04-16 NOTE — Progress Notes (Addendum)
[facility]  - [place] at [facility]   PATIENT NAME: Derek Dunn    MR#:  [id number]  DATE OF BIRTH:  December 15, 1970  SUBJECTIVE:  patient ambulating in the hallways. Tells me his right groin hernia pops out and moves away on lying down.    VITALS:  Blood pressure 95/66, pulse 74, temperature 98 F (36.7 C), resp. rate 16, height 6\' 4"  (1.93 m), weight 88.5 kg, SpO2 97 %.  PHYSICAL EXAMINATION:   GENERAL:  51 y.o.-year-old patient lying in the bed with no acute distress.  LUNGS: Normal breath sounds bilaterally, no wheezing, rales, rhonchi.  CARDIOVASCULAR: S1, S2 normal. No murmurs, rubs, or gallops.  ABDOMEN: Soft, nontender, nondistended. Bowel sounds present.  EXTREMITIES: No  edema b/l.   Right groin inguinal hernia NEUROLOGIC: nonfocal  patient is alert and awake SKIN: No obvious rash, lesion, or ulcer.   LABORATORY PANEL:  CBC Recent Labs  Lab 04/14/22 0445  WBC 2.7*  HGB 10.4*  HCT 34.6*  PLT 242    Chemistries  Recent Labs  Lab 04/14/22 0445  NA 139  K 4.2  CL 111  CO2 24  GLUCOSE 109*  BUN 15  CREATININE 0.83  CALCIUM 9.1  AST 698*  ALT 390*  ALKPHOS 224*  BILITOT 2.0*   Assessment and Plan  Mr. Derek Dunn is a 51 year old male with PMH polysubstance abuse including IV drug use in the past, housing insecurity (incarcerated past 2 months, just released (as of 7/12), hospital admission in May 2023 with MSSA bacteremia secondary to spinal osteomyelitis and epidural abscess, recurrent right inguinal hernia, history of signing out AMA.  He presented to the ED from [facility] on 03/15/2022 with complaints of worsening back pain and recurrent right groin pain.    Interventional radiology was consulted for abscess aspiration for cultures, performed on 7/12. Infectious disease was also consulted.    Neurosurgery consulted for consideration of washout in the OR, given poor likelihood of full resolution with antibiotics alone. He was  taken to the OR for washout on 03/24/22.  Underwent posterior laminectomy T8-9 for evacuation of epidural abscess, posterolateral arthrodesis T7 to T12 with T7-12 posterior instrumentation.    The patient will remain in the hospital to 04/26/2022 for IV antibiotics.  Epidural abscess Acute osteomyelitis discitis and facet joint septic arthritis at T8-9 with epidural abscess. Severe spinal stenosis at T8-T9. Acute septic arthritis at the left L5-S1 facet. History of IV drug use. Prior history MSSA bacteremia. --Initially on IV Cefepime, Vancomycin -- IR consulted, performed CT-guided aspiration for culture on 7/12 --Infectious disease consulted  --Antibiotic changed to cefazolin on 7/24 due to rising LFTs --Neurosurgery consulted.  Patient underwent T7-12 posterior instrumentation with decompression and washout of epidural abscess at T8-9 (performed on 03/24/22) -- Drain removed on 03/27/2022  -- Wound VAC removed 7/26 --Neurosurgery recommends: Mobilize, pain control, resume DVT prophylaxis, PT/OT -- Follow-up with neurosurgery in 2 weeks   7/28: per Neurosurgery, pt no longer needs dressing over incision.  May shower and pat incision area dry.   Patient will remain in the hospital for IV antibiotics through 04/26/2022.   Septic arthritis (HCC) Vertebral osteomyelitis (HCC) Signs of septic arthritis at level of L5-S1 seen on MRI lumbar spine.  Management as outlined, see epidural abscess.    Transaminitis Likely secondary to acute hepatitis B infection. LFTs still on the rise with AST of 621, ALT 326, INR 1.1.  Check liver function test tomorrow.   Back pain Patient converted  to Suboxone on 04/12/2022.  Will need an outpatient Suboxone clinic upon disposition.  This was discussed with TOC.   Fever Temp 100.4 noted on evening of 7/26. Secondary to acute hepatitis B infection   Acute hepatitis B Hepatitis C antibody test positive --HCV Ab reactive this admission.  Hepatitis C RNA not  detected.  Likely has cleared this infection --Acute hepatitis B infection with viral DNA greater than 1 billion.  Need to check antibodies as outpatient to see if he clears this infection.  Follow-up with GI as outpatient. --pt was seen by Dr Mia Creek [facility] GI)   Right inguinal hernia -repeat CT abd/pelvis on the 28th showed a persistent large right inguinal hernia with no evidence of bowel obstruction.  Advised he will need general surgery as outpatient. --order hernia groin truss     Insomnia Trazodone at bedtime ordered.   Stage II decubitus ulcer (HCC) Right buttock.  This has healed.   Anemia of chronic disease Last hemoglobin 9.3.  Ferritin elevated above 300.    Family communication :none Consults :GI, ID, neurosurgery, IR CODE STATUS: full DVT Prophylaxis :ambulation Level of care: Med-Surg Status is: Inpatient Remains inpatient appropriate because: needs IV abxs till 8/23    TOTAL TIME TAKING CARE OF THIS PATIENT: 25 minutes.  >50% time spent on counselling and coordination of care  Note: This dictation was prepared with Dragon dictation along with smaller phrase technology. Any transcriptional errors that result from this process are unintentional.  Enedina Finner M.D    [facility]   CC: Primary care physician; Pcp, No

## 2022-04-17 DIAGNOSIS — G062 Extradural and subdural abscess, unspecified: Secondary | ICD-10-CM | POA: Diagnosis not present

## 2022-04-17 LAB — HEPATIC FUNCTION PANEL
ALT: 427 U/L — ABNORMAL HIGH (ref 0–44)
AST: 886 U/L — ABNORMAL HIGH (ref 15–41)
Albumin: 3 g/dL — ABNORMAL LOW (ref 3.5–5.0)
Alkaline Phosphatase: 211 U/L — ABNORMAL HIGH (ref 38–126)
Bilirubin, Direct: 1.2 mg/dL — ABNORMAL HIGH (ref 0.0–0.2)
Indirect Bilirubin: 1.3 mg/dL — ABNORMAL HIGH (ref 0.3–0.9)
Total Bilirubin: 2.5 mg/dL — ABNORMAL HIGH (ref 0.3–1.2)
Total Protein: 7.1 g/dL (ref 6.5–8.1)

## 2022-04-17 NOTE — Progress Notes (Signed)
       CROSS COVER NOTE  NAME: Derek Dunn MRN: [id number] DOB : 11-Jun-1971    Notified by nursing that patient has fallen at 0330 this morning.  No loss of consciousness or change in sensorium that precipitated the fall.  He reports he stood up to use the urinal and fell asleep while standing up. Point of impact was his buttocks. He reports he did not hit his head. Patient is not complaining of any pain and there is no visible sign of injury according to nursing.  No further investigative work-up necessary at this time we will continue to monitor and consider testing if patient begins to exhibit any symptoms of  pain or change from neurological baseline.  This document was prepared using Dragon voice recognition software and may include unintentional dictation errors.  Bishop Limbo DNP, MHA, FNP-BC Nurse Practitioner [facility] Pager [phone]

## 2022-04-17 NOTE — Progress Notes (Signed)
Patient alert and oriented, reported that he had a fall at 0330 this morning, patient states that he fell on his butt while using his urinal at bedside ,assessed patient for any injury, no injury noted, VSS, hospitalist notified, [facility] aware, Safety zone portal completed, attempted to call family ( Aunt) no answer. Will continue to monitor.

## 2022-04-17 NOTE — Progress Notes (Signed)
[facility]  - [place] at [facility]   PATIENT NAME: Derek Dunn    MR#:  [id number]  DATE OF BIRTH:  1971-08-21  SUBJECTIVE:  iron reported accidental fall while using urinal in the middle of night. No injury. Patient resting quietly. No other issues today.    VITALS:  Blood pressure 102/73, pulse 79, temperature 98.7 F (37.1 C), resp. rate 16, height 6\' 4"  (1.93 m), weight 88.5 kg, SpO2 94 %.  PHYSICAL EXAMINATION:   GENERAL:  51 y.o.-year-old patient lying in the bed with no acute distress.  LUNGS: Normal breath sounds bilaterally.  CARDIOVASCULAR: S1, S2 normal.   ABDOMEN: Soft, nontender, nondistended. Bowel sounds present.  EXTREMITIES: No  edema b/l.   Right groin inguinal hernia NEUROLOGIC: nonfocal  LABORATORY PANEL:  CBC Recent Labs  Lab 04/14/22 0445  WBC 2.7*  HGB 10.4*  HCT 34.6*  PLT 242     Chemistries  Recent Labs  Lab 04/14/22 0445 04/17/22 0403  NA 139  --   K 4.2  --   CL 111  --   CO2 24  --   GLUCOSE 109*  --   BUN 15  --   CREATININE 0.83  --   CALCIUM 9.1  --   AST 698* 886*  ALT 390* 427*  ALKPHOS 224* 211*  BILITOT 2.0* 2.5*    Assessment and Plan  [name] is a 51 year old male with PMH polysubstance abuse including IV drug use in the past, housing insecurity (incarcerated past 2 months, just released (as of 7/12), hospital admission in May 2023 with MSSA bacteremia secondary to spinal osteomyelitis and epidural abscess, recurrent right inguinal hernia, history of signing out AMA.  He presented to the ED from [facility] on 03/15/2022 with complaints of worsening back pain and recurrent right groin pain.    Interventional radiology was consulted for abscess aspiration for cultures, performed on 7/12. Infectious disease was also consulted.    Neurosurgery consulted for consideration of washout in the OR, given poor likelihood of full resolution with antibiotics alone. He was taken to the OR for  washout on 03/24/22.  Underwent posterior laminectomy T8-9 for evacuation of epidural abscess, posterolateral arthrodesis T7 to T12 with T7-12 posterior instrumentation.    The patient will remain in the hospital to 04/26/2022 for IV antibiotics.  Epidural abscess Acute osteomyelitis discitis and facet joint septic arthritis at T8-9 with epidural abscess. Severe spinal stenosis at T8-T9. Acute septic arthritis at the left L5-S1 facet. History of IV drug use. Prior history MSSA bacteremia. --Initially on IV Cefepime, Vancomycin -- IR consulted, performed CT-guided aspiration for culture on 7/12 --Infectious disease consulted  --Antibiotic changed to cefazolin on 7/24 due to rising LFTs --Neurosurgery consulted.  Patient underwent T7-12 posterior instrumentation with decompression and washout of epidural abscess at T8-9 (performed on 03/24/22) -- Drain removed on 03/27/2022  -- Wound VAC removed 7/26 --Neurosurgery recommends: Mobilize, pain control, resume DVT prophylaxis, PT/OT -- Follow-up with neurosurgery in 2 weeks   7/28: per Neurosurgery, pt no longer needs dressing over incision.  May shower and pat incision area dry.   Patient will remain in the hospital for IV antibiotics through 04/26/2022.   Septic arthritis (HCC) Vertebral osteomyelitis (HCC) Signs of septic arthritis at level of L5-S1 seen on MRI lumbar spine.  Management as outlined, see epidural abscess.    Transaminitis Likely secondary to acute hepatitis B infection. LFTs still on the rise with AST of 621, ALT 326, INR 1.1.  Check liver function test tomorrow.   Back pain Patient converted to Suboxone on 04/12/2022.  Will need an outpatient Suboxone clinic upon disposition.  This was discussed with TOC.   Fever Temp 100.4 noted on evening of 7/26. Secondary to acute hepatitis B infection   Acute hepatitis B Hepatitis C antibody test positive --HCV Ab reactive this admission.  Hepatitis C RNA not detected.  Likely has  cleared this infection --Acute hepatitis B infection with viral DNA greater than 1 billion.  Need to check antibodies as outpatient to see if he clears this infection.  Follow-up with GI as outpatient. --pt was seen by Dr Mia Creek [facility] GI)   Right inguinal hernia -repeat CT abd/pelvis on the 28th showed a persistent large right inguinal hernia with no evidence of bowel obstruction.  Advised he will need general surgery as outpatient. --order hernia groin truss      Stage II decubitus ulcer (HCC) Right buttock.  This has healed.   Anemia of chronic disease Last hemoglobin 9.3.  Ferritin elevated above 300.    Family communication :none Consults :GI, ID, neurosurgery, IR CODE STATUS: full DVT Prophylaxis :ambulation Level of care: Med-Surg Status is: Inpatient Remains inpatient appropriate because: needs IV abxs till 8/23    TOTAL TIME TAKING CARE OF THIS PATIENT: 25 minutes.  >50% time spent on counselling and coordination of care  Note: This dictation was prepared with Dragon dictation along with smaller phrase technology. Any transcriptional errors that result from this process are unintentional.  Enedina Finner M.D    [facility]   CC: Primary care physician; Pcp, No

## 2022-04-17 NOTE — Progress Notes (Signed)
   04/17/22 0610  Precautions / Armbands  Precautions Fall risk  Patient armbands applied: Patient Identification (White);Fall risk (Yellow)  Risk for Aspiration  Aspiration Assessment - High Risk None of the above  Aspiration Assessment - At Risk None of the above - No interventions needed  Adult Fall Risk Assessment  Risk Factor Category (scoring not indicated) Fall has occurred during this admission (document High fall risk)  Patient Fall Risk Level High fall risk  Adult Fall Risk Interventions  Required Bundle Interventions *See Row Information* High fall risk - low, moderate, and high requirements implemented  Additional Interventions Use of appropriate toileting equipment (bedpan, BSC, etc.)  Screening for Fall Injury Risk (To be completed on HIGH fall risk patients) - Assessing Need for Floor Mats  Risk For Fall Injury- Criteria for Floor Mats Previous fall this admission  Will Implement Floor Mats Yes

## 2022-04-17 NOTE — Progress Notes (Signed)
Patient with reported all overnight.  Upon entry into patient's room, bed alarm sounding and patient sitting on the edge of the bed.  Patient states that he"hardly fell" and is not going to stay in the bed all day.  I educated patient about fall safety and patient states to turn bed alarm off.  Per patient request, bed alarm turned off.  Patient voiced understanding of fall risk r/t refusing bed alarm.

## 2022-04-18 DIAGNOSIS — G062 Extradural and subdural abscess, unspecified: Secondary | ICD-10-CM | POA: Diagnosis not present

## 2022-04-18 DIAGNOSIS — M4624 Osteomyelitis of vertebra, thoracic region: Secondary | ICD-10-CM | POA: Diagnosis not present

## 2022-04-18 MED ORDER — TRAZODONE HCL 50 MG PO TABS: 100.0000 mg | ORAL_TABLET | Freq: Every evening | ORAL | Status: DC | PRN

## 2022-04-18 NOTE — Progress Notes (Signed)
Mobility Specialist - Progress Note    04/18/22 1200  Mobility  Activity Ambulated independently in hallway  Level of Assistance Independent  Assistive Device None  Distance Ambulated (ft) 800 ft  Activity Response Tolerated well  $Mobility charge 1 Mobility    Clarisa Schools Mobility Specialist 04/18/22, 12:54 PM

## 2022-04-18 NOTE — Progress Notes (Signed)
ID He has no specific complaints Doing okay Back pain okay Says he fell 2 days ago after taking 2 tablets of trazadone and feeling dizzy No injuries  O/e awake and alert Patient Vitals for the past 24 hrs:  BP Temp Pulse Resp SpO2  04/18/22 1616 117/79 98.8 F (37.1 C) 74 18 99 %  04/18/22 0842 106/75 97.8 F (36.6 C) 63 18 96 %  04/18/22 0459 99/62 98 F (36.7 C) 64 18 95 %  04/17/22 1950 111/83 99.1 F (37.3 C) 88 18 100 %  04/17/22 1647 105/74 98.6 F (37 C) 84 18 100 %   Chest cta Hs s1s2 Abd soft Spinal surgical site- clean  Labs    Latest Ref Rng & Units 04/14/2022    4:45 AM 04/09/2022    5:48 AM 04/05/2022    5:20 AM  CBC  WBC 4.0 - 10.5 K/uL 2.7  3.9  3.1   Hemoglobin 13.0 - 17.0 g/dL 64.4  9.3  7.9   Hematocrit 39.0 - 52.0 % 34.6  32.0  27.3   Platelets 150 - 400 K/uL 242  327  364        Latest Ref Rng & Units 04/17/2022    4:03 AM 04/14/2022    4:45 AM 04/12/2022    5:15 AM  CMP  Glucose 70 - 99 mg/dL  034  96   BUN 6 - 20 mg/dL  15  15   Creatinine 7.42 - 1.24 mg/dL  5.95  6.38   Sodium 756 - 145 mmol/L  139  140   Potassium 3.5 - 5.1 mmol/L  4.2  4.3   Chloride 98 - 111 mmol/L  111  111   CO2 22 - 32 mmol/L  24  25   Calcium 8.9 - 10.3 mg/dL  9.1  8.8   Total Protein 6.5 - 8.1 g/dL 7.1  7.0  6.6   Total Bilirubin 0.3 - 1.2 mg/dL 2.5  2.0  1.8   Alkaline Phos 38 - 126 U/L 211  224  221   AST 15 - 41 U/L 886  698  621   ALT 0 - 44 U/L 427  390  326      Impression/recommendation MSSA thoracic vertebral infection - s/p fusion Pt is on cefazolin -  rifampin cannot be added due to acute hepb hepatitis  Acute HepB hepatitis with high Vl and worsening LFT Transaminases have not plateud yet Coag screen okay Avoid hepatotoxic drugs including gabapentin  Leucopenia due to hepb viral infection-improving  HEPC -spontaneous clearance  Pt is in the hospital getting IV for 6 weeks  04/26/22// as he cannot be sent home with PICC due to substance  use Discussed the management with patient and hospitalist

## 2022-04-18 NOTE — Progress Notes (Addendum)
[facility]  - [place] at [facility]   PATIENT NAME: Derek Dunn    MR#:  [id number]  DATE OF BIRTH:  12-09-1970  SUBJECTIVE:  No new issues. Requesting inguinal hernia support wear  VITALS:  Blood pressure 106/75, pulse 63, temperature 97.8 F (36.6 C), resp. rate 18, height 6\' 4"  (1.93 m), weight 88.5 kg, SpO2 96 %.  PHYSICAL EXAMINATION:   GENERAL:  51 y.o.-year-old patient lying in the bed with no acute distress.  LUNGS: Normal breath sounds bilaterally.  CARDIOVASCULAR: S1, S2 normal.   ABDOMEN: Soft, nontender, nondistended. Bowel sounds present.  EXTREMITIES: No  edema b/l.   Right groin inguinal hernia-reducible NEUROLOGIC: nonfocal  LABORATORY PANEL:  CBC Recent Labs  Lab 04/14/22 0445  WBC 2.7*  HGB 10.4*  HCT 34.6*  PLT 242     Chemistries  Recent Labs  Lab 04/14/22 0445 04/17/22 0403  NA 139  --   K 4.2  --   CL 111  --   CO2 24  --   GLUCOSE 109*  --   BUN 15  --   CREATININE 0.83  --   CALCIUM 9.1  --   AST 698* 886*  ALT 390* 427*  ALKPHOS 224* 211*  BILITOT 2.0* 2.5*    Assessment and Plan  Derek Dunn is a 51 year old male with PMH polysubstance abuse including IV drug use in the past, housing insecurity (incarcerated past 2 months, just released (as of 7/12), hospital admission in May 2023 with MSSA bacteremia secondary to spinal osteomyelitis and epidural abscess, recurrent right inguinal hernia, history of signing out AMA.  He presented to the ED from [facility] on 03/15/2022 with complaints of worsening back pain and recurrent right groin pain.    Interventional radiology was consulted for abscess aspiration for cultures, performed on 7/12. Infectious disease was also consulted.    Neurosurgery consulted for consideration of washout in the OR, given poor likelihood of full resolution with antibiotics alone. He was taken to the OR for washout on 03/24/22.  Underwent posterior laminectomy T8-9 for evacuation  of epidural abscess, posterolateral arthrodesis T7 to T12 with T7-12 posterior instrumentation.    The patient will remain in the hospital to 04/26/2022 for IV antibiotics.  Epidural abscess Acute osteomyelitis discitis and facet joint septic arthritis at T8-9 with epidural abscess. Severe spinal stenosis at T8-T9. Acute septic arthritis at the left L5-S1 facet. History of IV drug use. Prior history MSSA bacteremia. --Initially on IV Cefepime, Vancomycin -- IR consulted, performed CT-guided aspiration for culture on 7/12 --Infectious disease consulted  --Antibiotic changed to cefazolin on 7/24 due to rising LFTs --Neurosurgery consulted.  Patient underwent T7-12 posterior instrumentation with decompression and washout of epidural abscess at T8-9 (performed on 03/24/22) -- Drain removed on 03/27/2022  -- Wound VAC removed 7/26 --Neurosurgery recommends: Mobilize, pain control, resume DVT prophylaxis, PT/OT -- Follow-up with neurosurgery in 2 weeks   7/28: per Neurosurgery, pt no longer needs dressing over incision.  May shower and pat incision area dry.   Patient will remain in the hospital for IV antibiotics through 04/26/2022.   Septic arthritis (HCC) Vertebral osteomyelitis (HCC) Signs of septic arthritis at level of L5-S1 seen on MRI lumbar spine.  Management as outlined, see epidural abscess.    Transaminitis Likely secondary to acute hepatitis B infection. LFTs still on the rise with AST of 621, ALT 326, INR 1.1.  Check liver function test tomorrow.   Back pain Patient converted to Suboxone on  04/12/2022.  Will need an outpatient Suboxone clinic upon disposition.  This was discussed with TOC.   Fever Temp 100.4 noted on evening of 7/26. Secondary to acute hepatitis B infection   Acute hepatitis B Hepatitis C antibody test positive --HCV Ab reactive this admission.  Hepatitis C RNA not detected.  Likely has cleared this infection --Acute hepatitis B infection with viral DNA  greater than 1 billion.  Need to check antibodies as outpatient to see if he clears this infection.  Follow-up with GI as outpatient. --pt was seen by Dr Derek Dunn [facility]) --LFT's rising--d/w GI--nothing much to do. Cont supportive care. Pt has no GI complaints   Right inguinal hernia -repeat CT abd/pelvis on the 28th showed a persistent large right inguinal hernia with no evidence of bowel obstruction.  Advised he will need general surgery as outpatient. --Inguinal hernia truss--Called supply chain--not available    Stage II decubitus ulcer (HCC) Right buttock.  This has healed.   Anemia of chronic disease Last hemoglobin 9.3.  Ferritin elevated above 300.    Family communication :none Consults :GI, ID, neurosurgery, IR CODE STATUS: full DVT Prophylaxis :ambulation Level of care: Med-Surg Status is: Inpatient Remains inpatient appropriate because: needs IV abxs till 8/23    TOTAL TIME TAKING CARE OF THIS PATIENT: 25 minutes.  >50% time spent on counselling and coordination of care  Note: This dictation was prepared with Dragon dictation along with smaller phrase technology. Any transcriptional errors that result from this process are unintentional.  Enedina Finner M.D    [facility]   CC: Primary care physician; Pcp, No

## 2022-04-19 DIAGNOSIS — H6120 Impacted cerumen, unspecified ear: Secondary | ICD-10-CM

## 2022-04-19 DIAGNOSIS — G062 Extradural and subdural abscess, unspecified: Secondary | ICD-10-CM | POA: Diagnosis not present

## 2022-04-19 DIAGNOSIS — M4620 Osteomyelitis of vertebra, site unspecified: Secondary | ICD-10-CM | POA: Diagnosis not present

## 2022-04-19 DIAGNOSIS — M0008 Staphylococcal arthritis, vertebrae: Secondary | ICD-10-CM | POA: Diagnosis not present

## 2022-04-19 DIAGNOSIS — R7401 Elevation of levels of liver transaminase levels: Secondary | ICD-10-CM | POA: Diagnosis not present

## 2022-04-19 MED ORDER — POLYVINYL ALCOHOL 1.4 % OP SOLN
1.0000 [drp] | OPHTHALMIC | Status: DC | PRN
  Administered 2022-04-19: 1 [drp] via OPHTHALMIC
  Filled 2022-04-19: qty 15

## 2022-04-19 MED ORDER — FUROSEMIDE 40 MG PO TABS
40.0000 mg | ORAL_TABLET | Freq: Once | ORAL | Status: AC
  Administered 2022-04-19: 40 mg via ORAL
  Filled 2022-04-19: qty 1

## 2022-04-19 MED ORDER — CARBAMIDE PEROXIDE 6.5 % OT SOLN
5.0000 [drp] | Freq: Two times a day (BID) | OTIC | Status: DC
  Administered 2022-04-19 (×2): 5 [drp] via OTIC
  Filled 2022-04-19: qty 15

## 2022-04-19 NOTE — Assessment & Plan Note (Deleted)
Ruled out.  Tympanic membrane visualized without wax impaction.  Debrox otic solution discontinued.

## 2022-04-19 NOTE — Progress Notes (Signed)
Mobility Specialist - Progress Note    04/19/22 1400  Mobility  Activity Ambulated independently in hallway  Level of Assistance Independent  Assistive Device None  Distance Ambulated (ft) 800 ft  Activity Response Tolerated well  $Mobility charge 1 Mobility     Clarisa Schools Mobility Specialist 04/19/22, 2:24 PM

## 2022-04-19 NOTE — Progress Notes (Signed)
Progress Note   Patient: Derek Dunn JOI:[id number] DOB: 07-24-1971 DOA: 03/15/2022     35 DOS: the patient was seen and examined on 04/19/2022   Brief hospital course: Derek Dunn is a 51 year old male with PMH polysubstance abuse including IV drug use in the past, housing insecurity (incarcerated past 2 months, just released (as of 7/12), hospital admission in May 2023 with MSSA bacteremia secondary to spinal osteomyelitis and epidural abscess, recurrent right inguinal hernia, history of signing out AMA.  He presented to the ED from [facility] on 03/15/2022 with complaints of worsening back pain and recurrent right groin pain.  No reported fevers.  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  Labs were mostly unremarkable including no leukocytosis and normal lactic acid.  Sepsis ruled out.  Patient was started on broad-spectrum IV antibiotic coverage and admitted for further evaluation and management.  Interventional radiology was consulted for abscess aspiration for cultures, performed on 7/12. Infectious disease was also consulted.   Neurosurgery consulted for consideration of washout in the OR, given poor likelihood of full resolution with antibiotics alone. He was taken to the OR for washout on 03/24/22.  Underwent posterior laminectomy T8-9 for evacuation of epidural abscess, posterolateral arthrodesis T7 to T12 with T7-12 posterior instrumentation.   The patient will remain in the hospital to 04/26/2022 for IV antibiotics.  Patient converted to Suboxone on 04/12/2022.  Assessment and Plan: * Epidural abscess Acute osteomyelitis discitis and facet joint septic arthritis at T8-9 with epidural abscess. Severe spinal stenosis at T8-T9. Acute septic arthritis at the left L5-S1 facet. History of IV drug use. Prior history MSSA bacteremia. --Initially on  IV Cefepime, Vancomycin -- IR consulted, performed CT-guided aspiration for culture on 7/12 --Infectious disease consulted  --Was on nafcillin continuous IV infusion --Antibiotic changed to cefazolin on 7/24 due to rising LFTs --Neurosurgery consulted.  Patient underwent T7-12 posterior instrumentation with decompression and washout of epidural abscess at T8-9 (performed on 03/24/22) -- Drain removed on 03/27/2022  -- Wound VAC removed 7/26 --Neurosurgery recommends: Mobilize, pain control, resume DVT prophylaxis, PT/OT -- Follow-up with neurosurgery in 2 weeks  7/28: per Neurosurgery, pt no longer needs dressing over incision.  May shower and pat incision area dry.  Patient will remain in the hospital for IV antibiotics through 04/26/2022.  Septic arthritis (HCC) Signs of septic arthritis at level of L5-S1 seen on MRI lumbar spine.  Management as outlined, see epidural abscess.  Discitis thoracic region See epidural abscess for plan  Vertebral osteomyelitis (HCC) Management as outlined  Transaminitis Likely secondary to acute hepatitis B infection. LFTs still on the rise with AST of 86 and ALT of 427, total bilirubin 2.5  Back pain Patient converted to Suboxone on 04/12/2022.  Will need an outpatient Suboxone clinic upon disposition.  This was discussed with TOC.  Fever Temp 100.4 noted on evening of 7/26. Secondary to acute hepatitis B infection  Acute hepatitis B Acute hepatitis B infection with viral DNA greater than 1 billion.  Need to check antibodies as outpatient to see if he clears this infection.  Follow-up with GI as outpatient.  Right inguinal hernia -repeat CT abd/pelvis on the 28th showed a persistent large right inguinal hernia with no evidence of bowel obstruction.  Advised he will need general surgery as outpatient.    Hypotension BP as low as 87/61 on AM of 7/13, suspect due to  pain medications.  Otherwise no signs of sepsis.   Insomnia Trazodone at bedtime  ordered.  Cerumen impaction Debrox otic solution  Stage II decubitus ulcer (HCC) Right buttock.  This has healed.  Anemia of chronic disease Last hemoglobin 10.4.  Ferritin elevated above 300.  Hepatitis C antibody test positive HCV Ab reactive this admission.  Hepatitis C RNA not detected.  Likely has cleared this infection.        Subjective: Patient feels okay.  He stated he had a fall the other day.  He complained to nurse that he has some wax in his ears.  Physical Exam: Vitals:   04/18/22 1616 04/18/22 2016 04/19/22 0432 04/19/22 0800  BP: 117/79 (!) 143/95 (!) 137/95 (!) 123/93  Pulse: 74 (!) 106 97 91  Resp: 18 18 18 18   Temp: 98.8 F (37.1 C) 98.7 F (37.1 C) 98.8 F (37.1 C) 98.7 F (37.1 C)  TempSrc:  Oral Oral Oral  SpO2: 99% 94% 97% 97%  Weight:      Height:       Physical Exam HENT:     Head: Normocephalic.     Mouth/Throat:     Pharynx: No oropharyngeal exudate.  Eyes:     General: Lids are normal.     Conjunctiva/sclera: Conjunctivae normal.  Cardiovascular:     Rate and Rhythm: Normal rate and regular rhythm.     Heart sounds: Normal heart sounds, S1 normal and S2 normal.  Pulmonary:     Breath sounds: No decreased breath sounds, wheezing, rhonchi or rales.  Abdominal:     Palpations: Abdomen is soft.     Tenderness: There is no abdominal tenderness.  Musculoskeletal:     Right lower leg: Swelling present.     Left lower leg: Swelling present.  Skin:    General: Skin is warm.     Comments:    Neurological:     Mental Status: He is alert and oriented to person, place, and time.     Data Reviewed: AST 886, ALT 427, total bilirubin 2.5.  Family Communication: None  Disposition: Status is: Inpatient Remains inpatient appropriate because: Needs IV antibiotics to 04/26/2022.  Unable to do IV antibiotics at home with his history.  Planned Discharge Destination: Home    Time spent: 28 minutes  Author: 04/28/2022,  MD 04/19/2022 3:23 PM  For on call review [email or web address].

## 2022-04-20 ENCOUNTER — Inpatient Hospital Stay

## 2022-04-20 DIAGNOSIS — M0008 Staphylococcal arthritis, vertebrae: Secondary | ICD-10-CM | POA: Diagnosis not present

## 2022-04-20 DIAGNOSIS — G9341 Metabolic encephalopathy: Secondary | ICD-10-CM

## 2022-04-20 DIAGNOSIS — R7401 Elevation of levels of liver transaminase levels: Secondary | ICD-10-CM | POA: Diagnosis not present

## 2022-04-20 DIAGNOSIS — M4624 Osteomyelitis of vertebra, thoracic region: Secondary | ICD-10-CM | POA: Diagnosis not present

## 2022-04-20 DIAGNOSIS — G062 Extradural and subdural abscess, unspecified: Secondary | ICD-10-CM | POA: Diagnosis not present

## 2022-04-20 LAB — COMPREHENSIVE METABOLIC PANEL
ALT: 544 U/L — ABNORMAL HIGH (ref 0–44)
AST: 1371 U/L — ABNORMAL HIGH (ref 15–41)
Albumin: 3.6 g/dL (ref 3.5–5.0)
Alkaline Phosphatase: 210 U/L — ABNORMAL HIGH (ref 38–126)
Anion gap: 6 (ref 5–15)
BUN: 16 mg/dL (ref 6–20)
CO2: 28 mmol/L (ref 22–32)
Calcium: 9.4 mg/dL (ref 8.9–10.3)
Chloride: 102 mmol/L (ref 98–111)
Creatinine, Ser: 0.83 mg/dL (ref 0.61–1.24)
GFR, Estimated: >60 mL/min (ref >60)
Glucose, Bld: 120 mg/dL — ABNORMAL HIGH (ref 70–99)
Potassium: 4.6 mmol/L (ref 3.5–5.1)
Sodium: 136 mmol/L (ref 135–145)
Total Bilirubin: 4 mg/dL — ABNORMAL HIGH (ref 0.3–1.2)
Total Protein: 8.2 g/dL — ABNORMAL HIGH (ref 6.5–8.1)

## 2022-04-20 LAB — URINALYSIS, ROUTINE W REFLEX MICROSCOPIC
Glucose, UA: NEGATIVE mg/dL
Hgb urine dipstick: NEGATIVE
Ketones, ur: NEGATIVE mg/dL
Leukocytes,Ua: NEGATIVE
Nitrite: NEGATIVE
Protein, ur: 30 mg/dL — AB
Specific Gravity, Urine: 1.03 (ref 1.005–1.030)
pH: 5 (ref 5.0–8.0)

## 2022-04-20 LAB — PROTIME-INR
INR: 1.1 (ref 0.8–1.2)
Prothrombin Time: 13.9 seconds (ref 11.4–15.2)

## 2022-04-20 LAB — URINE DRUG SCREEN, QUALITATIVE (ARMC ONLY)
Amphetamines, Ur Screen: POSITIVE — AB
Barbiturates, Ur Screen: NOT DETECTED
Benzodiazepine, Ur Scrn: NOT DETECTED
Cannabinoid 50 Ng, Ur ~~LOC~~: POSITIVE — AB
Cocaine Metabolite,Ur ~~LOC~~: NOT DETECTED
MDMA (Ecstasy)Ur Screen: NOT DETECTED
Methadone Scn, Ur: NOT DETECTED
Opiate, Ur Screen: NOT DETECTED
Phencyclidine (PCP) Ur S: NOT DETECTED
Tricyclic, Ur Screen: NOT DETECTED

## 2022-04-20 MED ORDER — GABAPENTIN 300 MG PO CAPS
300.0000 mg | ORAL_CAPSULE | Freq: Three times a day (TID) | ORAL | Status: DC
  Administered 2022-04-20 – 2022-04-23 (×12): 300 mg via ORAL
  Filled 2022-04-20 (×12): qty 1

## 2022-04-20 MED ORDER — POTASSIUM CHLORIDE CRYS ER 20 MEQ PO TBCR
20.0000 meq | EXTENDED_RELEASE_TABLET | Freq: Every day | ORAL | Status: DC
Start: 2022-04-20 — End: 2022-04-24
  Administered 2022-04-20 – 2022-04-24 (×5): 20 meq via ORAL
  Filled 2022-04-20 (×5): qty 1

## 2022-04-20 MED ORDER — FUROSEMIDE 40 MG PO TABS
40.0000 mg | ORAL_TABLET | Freq: Every day | ORAL | Status: DC
  Administered 2022-04-20 – 2022-04-21 (×2): 40 mg via ORAL
  Filled 2022-04-20 (×3): qty 1

## 2022-04-20 NOTE — Progress Notes (Signed)
Called [phone] regarding inguinal hernia truss, states that they will deliver in 3 days or sooner

## 2022-04-20 NOTE — Progress Notes (Signed)
Mobility Specialist - Progress Note    04/20/22 1400  Mobility  Activity Ambulated independently in hallway  Level of Assistance Independent  Assistive Device None  Distance Ambulated (ft) 800 ft  Activity Response Tolerated well  $Mobility charge 1 Mobility    Clarisa Schools Mobility Specialist 04/20/22, 2:47 PM

## 2022-04-20 NOTE — Assessment & Plan Note (Addendum)
Mental status improved.  CT scan of the head negative.  Urine toxicology on 04/20/2022 positive for amphetamines and cannabis.  Reviewed nursing notes from last night.  Telemetry sitter will be continued through the rest of the hospital stay.

## 2022-04-20 NOTE — Progress Notes (Addendum)
Pt has been more agitated tonight - nervous energy, tachycardic.  C/o red eyes and muscle cramping.  He was crying out in his sleep - went into his room to check on him.  Pt sat up in the bed and a vape fell out of his pocket.  Picked it up and asked pt what it was.  He became very agitated and demanded it back stating it was empty any way.  Gave back to patient and after consulting with [facility], asked Security to accompany me into pt's room when I requested the vape again. Pt was in bathroom and said he had put it down the toilet.  When he became aware that Security was present, he came out and handed it to the Security guard in a petulant manner.  Pt's behavior aroused the suspicion of Security and they initiated a room search in pt's presence.  He said he had had the vape the whole time and why were we just now asking for it and claiming he didn't know it wasn't allowed. Nothing else showed up in the search and the vape was placed in the med bin.  Security also stated that the canister was a standard vape and that nothing could have been added to it to cause his agitated behavior.

## 2022-04-20 NOTE — Progress Notes (Signed)
Dr Renae Gloss, [facility], AD, DD, SW aware that NT reports 2 visitors in the room with pt, NT states that pt and visitors seem "high", questionably using illegal substance in room, telesitter ordered by Dr Renae Gloss whom explained to pt via telephone why telesitter will be in room, pt agreed with telesitter, security called to have visitors escorted out of room, front desk made aware to restrict visitors for this pt

## 2022-04-20 NOTE — Progress Notes (Signed)
Progress Note   Patient: Derek Dunn CHE:[id number] DOB: 1971-01-04 DOA: 03/15/2022     36 DOS: the patient was seen and examined on 04/20/2022   Brief hospital course: Derek Dunn is a 52 year old male with PMH polysubstance abuse including IV drug use in the past, housing insecurity (incarcerated past 2 months, just released (as of 7/12), hospital admission in May 2023 with MSSA bacteremia secondary to spinal osteomyelitis and epidural abscess, recurrent right inguinal hernia, history of signing out AMA.  He presented to the ED from [facility] on 03/15/2022 with complaints of worsening back pain and recurrent right groin pain.  No reported fevers.  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  Labs were mostly unremarkable including no leukocytosis and normal lactic acid.  Sepsis ruled out.  Patient was started on broad-spectrum IV antibiotic coverage and admitted for further evaluation and management.  Interventional radiology was consulted for abscess aspiration for cultures, performed on 7/12. Infectious disease was also consulted.   Neurosurgery consulted for consideration of washout in the OR, given poor likelihood of full resolution with antibiotics alone. He was taken to the OR for washout on 03/24/22.  Underwent posterior laminectomy T8-9 for evacuation of epidural abscess, posterolateral arthrodesis T7 to T12 with T7-12 posterior instrumentation.   The patient will remain in the hospital to 04/26/2022 for IV antibiotics.  Patient converted to Suboxone on 04/12/2022.  Patient had some altered mental status on 04/19/2022 to 04/20/2022.  Reviewed in nursing notes.  Urine toxicology positive for amphetamines and cannabis.  Assessment and Plan: * Acute metabolic encephalopathy Patient stated he had trouble finding some words and speaking last night.  CT  scan of the head negative.  Urine toxicology positive for amphetamines and cannabis.  Reviewed nursing notes from last night.  Patient denies use of drugs.  Case discussed with nursing staff.  Telemetry sitter ordered.  Transaminitis Liver function test continue to rise.  AST 1371, ALT 544, total bilirubin 4.0, INR 1.1.  Case discussed with gastroenterology and continue to monitor liver function test.  I will decrease dose of gabapentin and may need to get rid of gabapentin.  Discontinue Robaxin.  Septic arthritis (HCC) Signs of septic arthritis at level of L5-S1 seen on MRI lumbar spine.  Management as outlined, see epidural abscess.  Discitis thoracic region See epidural abscess for plan  Epidural abscess Acute osteomyelitis discitis and facet joint septic arthritis at T8-9 with epidural abscess. Severe spinal stenosis at T8-T9. Acute septic arthritis at the left L5-S1 facet. History of IV drug use. Prior history MSSA bacteremia. --Initially on IV Cefepime, Vancomycin -- IR consulted, performed CT-guided aspiration for culture on 7/12 --Infectious disease consulted  --Was on nafcillin continuous IV infusion --Antibiotic changed to cefazolin on 7/24 due to rising LFTs --Neurosurgery consulted.  Patient underwent T7-12 posterior instrumentation with decompression and washout of epidural abscess at T8-9 (performed on 03/24/22) -- Drain removed on 03/27/2022  -- Wound VAC removed 7/26 --Neurosurgery recommends: Mobilize, pain control, resume DVT prophylaxis, PT/OT -- Follow-up with neurosurgery in 2 weeks  7/28: per Neurosurgery, pt no longer needs dressing over incision.  May shower and pat incision area dry.  Patient will remain in the hospital for IV antibiotics through 04/26/2022.  Vertebral osteomyelitis (HCC) Management as outlined  Back pain Patient converted to Suboxone on 04/12/2022.  Will need an outpatient Suboxone clinic upon disposition.  This was  discussed with  TOC.  Fever Temp 100.4 noted on evening of 7/26. Secondary to acute hepatitis B infection  Acute hepatitis B Acute hepatitis B infection with viral DNA greater than 1 billion.  Need to check antibodies as outpatient to see if he clears this infection.  Follow-up with GI as outpatient.  Right inguinal hernia -repeat CT abd/pelvis on the 28th showed a persistent large right inguinal hernia with no evidence of bowel obstruction.  Advised he will need general surgery as outpatient.    Hypotension BP as low as 87/61 on AM of 7/13, suspect due to pain medications.  Otherwise no signs of sepsis.   Insomnia Trazodone at bedtime ordered.  Cerumen impaction Ruled out.  Tympanic membrane visualized without wax impaction.  Debrox otic solution discontinued.  Stage II decubitus ulcer (HCC) Right buttock.  This has healed.  Anemia of chronic disease Last hemoglobin 10.4.  Ferritin elevated above 300.  Hepatitis C antibody test positive HCV Ab reactive this admission.  Hepatitis C RNA not detected.  Likely has cleared this infection.        Subjective: Reviewed nursing notes from overnight and discussed case with nursing staff today.  Urine toxicology positive for amphetamines and cannabis.  Patient denied using any substances here.  Patient complains of dark urine and some altered mental status last night with some difficulty speaking.  Physical Exam: Vitals:   04/19/22 0800 04/19/22 1629 04/19/22 1943 04/20/22 1610  BP: (!) 123/93 124/88 122/82 114/70  Pulse: 91 (!) 102 (!) 104 86  Resp: 18 16 18 16   Temp: 98.7 F (37.1 C) 98.5 F (36.9 C) 99.2 F (37.3 C) 98.7 F (37.1 C)  TempSrc: Oral  Axillary   SpO2: 97% 97% 100% 96%  Weight:      Height:       Physical Exam HENT:     Head: Normocephalic.     Ears:     Comments: Tympanic membrane no erythema.  No wax in his ears.    Mouth/Throat:     Pharynx: No oropharyngeal exudate.  Eyes:     General: Lids are normal.      Conjunctiva/sclera: Conjunctivae normal.  Cardiovascular:     Rate and Rhythm: Normal rate and regular rhythm.     Heart sounds: Normal heart sounds, S1 normal and S2 normal.  Pulmonary:     Breath sounds: No decreased breath sounds, wheezing, rhonchi or rales.  Abdominal:     Palpations: Abdomen is soft.     Tenderness: There is no abdominal tenderness.  Musculoskeletal:     Right lower leg: Swelling present.     Left lower leg: Swelling present.  Skin:    General: Skin is warm.     Comments:    Neurological:     Mental Status: He is alert and oriented to person, place, and time.     Data Reviewed: Urine toxicology positive for amphetamines and cannabis.  Previous urine toxicology on 7/21 was negative for these 2 agents. Urinalysis does not show any blood but positive for bilirubin. AST elevated 1371, ALT 544, total bilirubin 4.0, and INR 1.1  Disposition: Status is: Inpatient Remains inpatient appropriate because: Needs to receive IV antibiotics in hospital to 04/26/2022  Planned Discharge Destination: Home    Time spent: 28 minutes  Author: 04/28/2022, MD 04/20/2022 4:20 PM  For on call review [email or web address].

## 2022-04-20 NOTE — Progress Notes (Signed)
GI Inpatient Follow-up Note  Subjective:  Patient seen and doing well. Alert and oriented x 3.  Scheduled Inpatient Medications:   buprenorphine-naloxone  1 tablet Sublingual Q12H   clotrimazole  1 Application Topical BID   ferrous sulfate  325 mg Oral Q breakfast   furosemide  40 mg Oral Daily   gabapentin  300 mg Oral TID   polyethylene glycol  17 g Oral Daily   potassium chloride  20 mEq Oral Daily   senna-docusate  1 tablet Oral BID   sorbitol  30 mL Oral Daily    Continuous Inpatient Infusions:    sodium chloride Stopped (04/11/22 0046)    ceFAZolin (ANCEF) IV 2 g (04/20/22 1343)    PRN Inpatient Medications:  sodium chloride, bisacodyl, lactulose, ondansetron **OR** ondansetron (ZOFRAN) IV, mouth rinse, polyvinyl alcohol  Review of Systems:  Review of Systems  Constitutional:  Positive for malaise/fatigue. Negative for chills and fever.  Respiratory:  Negative for shortness of breath.   Cardiovascular:  Negative for chest pain.  Gastrointestinal:  Negative for blood in stool, diarrhea, melena, nausea and vomiting.  Musculoskeletal:  Positive for back pain.  Skin:  Negative for itching and rash.  Neurological:  Negative for focal weakness.  Psychiatric/Behavioral:  Positive for substance abuse.   All other systems reviewed and are negative.     Physical Examination: BP 122/82 (BP Location: Right Arm)   Pulse (!) 104   Temp 99.2 F (37.3 C) (Axillary)   Resp 18   Ht 6\' 4"  (1.93 m)   Wt 88.5 kg   SpO2 100%   BMI 23.75 kg/m  Gen: NAD, alert and oriented x 4 HEENT: PEERLA, EOMI, Neck: supple, no JVD or thyromegaly Chest: No respiratory distress Abd: soft, non-tender, non-distended,no HSM, guarding, ridigity, or rebound tenderness Ext: no edema, well perfused with 2+ pulses, Skin: no rash or lesions noted Lymph: no LAD  Data: Lab Results  Component Value Date   WBC 2.7 (L) 04/14/2022   HGB 10.4 (L) 04/14/2022   HCT 34.6 (L) 04/14/2022   MCV 88.7  04/14/2022   PLT 242 04/14/2022   Recent Labs  Lab 04/14/22 0445  HGB 10.4*   Lab Results  Component Value Date   NA 136 04/20/2022   K 4.6 04/20/2022   CL 102 04/20/2022   CO2 28 04/20/2022   BUN 16 04/20/2022   CREATININE 0.83 04/20/2022   Lab Results  Component Value Date   ALT 544 (H) 04/20/2022   AST 1,371 (H) 04/20/2022   ALKPHOS 210 (H) 04/20/2022   BILITOT 4.0 (H) 04/20/2022   Recent Labs  Lab 04/20/22 0544  INR 1.1   Assessment/Plan: Derek Dunn is a 51 y.o. gentleman with history of IVDU with MSSA osteomyelitis and acute hepatitis B. Given no evidence of liver failure there is no current indication for treating hepatitis B. No evidence of cirrhosis. Greater than 95% of the time there is spontaneous clearance of hepatitis B and only 5% of the time it becomes chronic. There is a < 1% chance of acute hepatic failure from hepatitis B. Liver enzymes in the thousands are common for acute hepatitis B. The most important thing to monitor is his INR and mental status. Acute liver failure is defined as INR > 1.5 and evidence of HE.  Recommendations:  - continue to monitor liver enzymes and check daily INR -  will check hepatitis D - continue supportive care - will need outpatient f/u to document clearance   Will continue  to follow peripherally, please call with any questions or concerns.  Merlyn Lot MD, MPH [facility]

## 2022-04-20 NOTE — Progress Notes (Signed)
Visitors have been escorted out by security, explained to pt that telesitter would be placed in room, pt states "ok"

## 2022-04-20 NOTE — Progress Notes (Signed)
Dr Renae Gloss aware of UDS result

## 2022-04-20 NOTE — Progress Notes (Signed)
Dr Renae Gloss made aware of RN report/note from last night, aware that pt seems to be more anxious and fidgety this morning, not like his normal self, MD to assess pt

## 2022-04-20 NOTE — Progress Notes (Signed)
CSW consulted with [facility] supervisor regarding patient's positive UDS results and suspected substance use in hospital room, although patient denies. UDS negative upon admission to hospital, positive today 8/17, same day security has confiscated vape from patient's hospital room.   Per [facility] supervisor suggested patient move closer to the nurses station, restrict visitors and or tele sitter.  [place], [place] [phone]

## 2022-04-20 NOTE — Progress Notes (Signed)
ID Pt apprently has been altered and thought to be smoking substance Urine tox screen amphetamine and marijuan  O/e awake and alert Patient Vitals for the past 24 hrs:  BP Temp Temp src Pulse Resp SpO2  04/19/22 1943 122/82 99.2 F (37.3 C) Axillary (!) 104 18 100 %  04/19/22 1629 124/88 98.5 F (36.9 C) -- (!) 102 16 97 %   Chest cta Hs s1s2 Abd soft Spinal surgical site- clean  Labs    Latest Ref Rng & Units 04/14/2022    4:45 AM 04/09/2022    5:48 AM 04/05/2022    5:20 AM  CBC  WBC 4.0 - 10.5 K/uL 2.7  3.9  3.1   Hemoglobin 13.0 - 17.0 g/dL 57.2  9.3  7.9   Hematocrit 39.0 - 52.0 % 34.6  32.0  27.3   Platelets 150 - 400 K/uL 242  327  364        Latest Ref Rng & Units 04/20/2022    5:44 AM 04/17/2022    4:03 AM 04/14/2022    4:45 AM  CMP  Glucose 70 - 99 mg/dL 620   355   BUN 6 - 20 mg/dL 16   15   Creatinine 9.74 - 1.24 mg/dL 1.63   8.45   Sodium 364 - 145 mmol/L 136   139   Potassium 3.5 - 5.1 mmol/L 4.6   4.2   Chloride 98 - 111 mmol/L 102   111   CO2 22 - 32 mmol/L 28   24   Calcium 8.9 - 10.3 mg/dL 9.4   9.1   Total Protein 6.5 - 8.1 g/dL 8.2  7.1  7.0   Total Bilirubin 0.3 - 1.2 mg/dL 4.0  2.5  2.0   Alkaline Phos 38 - 126 U/L 210  211  224   AST 15 - 41 U/L 1,371  886  698   ALT 0 - 44 U/L 544  427  390      Impression/recommendation MSSA thoracic vertebral infection - s/p fusion Pt is on cefazolin -  rifampin cannot be added due to acute hepb hepatitis  Acute HepB hepatitis with high Vl and worsening LFT Coag screen okay Avoid hepatotoxic drugs including gabapentin GI following   Leucopenia due to hepb viral infection-improving  HEPC -spontaneous clearance  Pt is in the hospital getting IV for 6 weeks  04/26/22// - I Pts urine tox screen positive here for amphetamine /marijuana May have to DC him on PO keflex   Discussed the management with patient and hospitalist

## 2022-04-21 DIAGNOSIS — G9341 Metabolic encephalopathy: Secondary | ICD-10-CM | POA: Diagnosis not present

## 2022-04-21 DIAGNOSIS — M4620 Osteomyelitis of vertebra, site unspecified: Secondary | ICD-10-CM | POA: Diagnosis not present

## 2022-04-21 DIAGNOSIS — R7401 Elevation of levels of liver transaminase levels: Secondary | ICD-10-CM | POA: Diagnosis not present

## 2022-04-21 DIAGNOSIS — G062 Extradural and subdural abscess, unspecified: Secondary | ICD-10-CM | POA: Diagnosis not present

## 2022-04-21 LAB — CBC
HCT: 34.6 % — ABNORMAL LOW (ref 39.0–52.0)
Hemoglobin: 10.5 g/dL — ABNORMAL LOW (ref 13.0–17.0)
MCH: 26.9 pg (ref 26.0–34.0)
MCHC: 30.3 g/dL (ref 30.0–36.0)
MCV: 88.5 fL (ref 80.0–100.0)
Platelets: 185 10*3/uL (ref 150–400)
RBC: 3.91 MIL/uL — ABNORMAL LOW (ref 4.22–5.81)
RDW: 18.7 % — ABNORMAL HIGH (ref 11.5–15.5)
WBC: 2.4 10*3/uL — ABNORMAL LOW (ref 4.0–10.5)
nRBC: 0 % (ref 0.0–0.2)

## 2022-04-21 LAB — COMPREHENSIVE METABOLIC PANEL
ALT: 542 U/L — ABNORMAL HIGH (ref 0–44)
AST: 1451 U/L — ABNORMAL HIGH (ref 15–41)
Albumin: 2.9 g/dL — ABNORMAL LOW (ref 3.5–5.0)
Alkaline Phosphatase: 170 U/L — ABNORMAL HIGH (ref 38–126)
Anion gap: 4 — ABNORMAL LOW (ref 5–15)
BUN: 12 mg/dL (ref 6–20)
CO2: 25 mmol/L (ref 22–32)
Calcium: 9.1 mg/dL (ref 8.9–10.3)
Chloride: 110 mmol/L (ref 98–111)
Creatinine, Ser: 0.68 mg/dL (ref 0.61–1.24)
GFR, Estimated: >60 mL/min (ref >60)
Glucose, Bld: 106 mg/dL — ABNORMAL HIGH (ref 70–99)
Potassium: 4.1 mmol/L (ref 3.5–5.1)
Sodium: 139 mmol/L (ref 135–145)
Total Bilirubin: 3.7 mg/dL — ABNORMAL HIGH (ref 0.3–1.2)
Total Protein: 6.8 g/dL (ref 6.5–8.1)

## 2022-04-21 NOTE — Progress Notes (Signed)
Progress Note   Patient: Derek Dunn ERX:[id number] DOB: [birth year] DOA: 03/15/2022     37 DOS: the patient was seen and examined on 04/21/2022   Brief hospital course: Mr. Kundrat is a 51 year old male with PMH polysubstance abuse including IV drug use in the past, housing insecurity (incarcerated past 2 months, just released (as of 7/12), hospital admission in May 2023 with MSSA bacteremia secondary to spinal osteomyelitis and epidural abscess, recurrent right inguinal hernia, history of signing out AMA.  He presented to the ED from [facility] on 03/15/2022 with complaints of worsening back pain and recurrent right groin pain.  No reported fevers.  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  Labs were mostly unremarkable including no leukocytosis and normal lactic acid.  Sepsis ruled out.  Patient was started on broad-spectrum IV antibiotic coverage and admitted for further evaluation and management.  Interventional radiology was consulted for abscess aspiration for cultures, performed on 7/12. Infectious disease was also consulted.   Neurosurgery consulted for consideration of washout in the OR, given poor likelihood of full resolution with antibiotics alone. He was taken to the OR for washout on 03/24/22.  Underwent posterior laminectomy T8-9 for evacuation of epidural abscess, posterolateral arthrodesis T7 to T12 with T7-12 posterior instrumentation.   The patient will remain in the hospital to 04/26/2022 for IV antibiotics.  Patient converted to Suboxone on 04/12/2022.  Patient had some altered mental status on 04/19/2022 to 04/20/2022.  Reviewed in nursing notes.  Urine toxicology positive for amphetamines and cannabis.  Assessment and Plan: * Acute metabolic encephalopathy Mental status improved.  CT scan of the head negative.  Urine toxicology on  04/20/2022 positive for amphetamines and cannabis.  Reviewed nursing notes from last night.  Telemetry sitter will be continued through the rest of the hospital stay.  Transaminitis AST test continue to rise.  AST 1451.  ALT plateaued and started to come down to 542.  Total bilirubin down to 3.7, last INR 1.1.  Continue decreased dose of gabapentin.  Septic arthritis (HCC) Signs of septic arthritis at level of L5-S1 seen on MRI lumbar spine.  Management as outlined, see epidural abscess.  Discitis thoracic region See epidural abscess for plan  Epidural abscess Acute osteomyelitis discitis and facet joint septic arthritis at T8-9 with epidural abscess. Severe spinal stenosis at T8-T9. Acute septic arthritis at the left L5-S1 facet. History of IV drug use. Prior history MSSA bacteremia. --Initially on IV Cefepime, Vancomycin -- IR consulted, performed CT-guided aspiration for culture on 7/12 --Infectious disease consulted  --Was on nafcillin continuous IV infusion --Antibiotic changed to cefazolin on 7/24 due to rising LFTs --Neurosurgery consulted.  Patient underwent T7-12 posterior instrumentation with decompression and washout of epidural abscess at T8-9 (performed on 03/24/22) -- Drain removed on 03/27/2022  -- Wound VAC removed 7/26 --Neurosurgery recommends: Mobilize, pain control, resume DVT prophylaxis, PT/OT -- Follow-up with neurosurgery in 2 weeks  7/28: per Neurosurgery, pt no longer needs dressing over incision.  May shower and pat incision area dry.  Patient will remain in the hospital for IV antibiotics through 04/26/2022.  Vertebral osteomyelitis (HCC) Management as outlined  Back pain Patient converted to Suboxone on 04/12/2022.  Will need an outpatient Suboxone clinic upon disposition.  This was discussed with TOC.  Fever Temp 100.4 noted on evening of 7/26. Secondary to acute hepatitis B infection  Acute hepatitis B Acute  hepatitis B infection with viral DNA  greater than 1 billion.  Need to check antibodies as outpatient to see if he clears this infection.  Follow-up with GI as outpatient.  Right inguinal hernia -repeat CT abd/pelvis on the 28th showed a persistent large right inguinal hernia with no evidence of bowel obstruction.  Advised he will need general surgery as outpatient.    Hypotension BP as low as 87/61 on AM of 7/13, suspect due to pain medications.  Otherwise no signs of sepsis.   Insomnia Trazodone at bedtime ordered.  Stage II decubitus ulcer (HCC) Right buttock.  This has healed.  Anemia of chronic disease Last hemoglobin 10.5.  Ferritin elevated above 300.  Hepatitis C antibody test positive HCV Ab reactive this admission.  Hepatitis C RNA not detected.  Likely has cleared this infection.        Subjective: Patient did not want to talk about his urine toxicology report.  Complained of back pain about the same as it has been.  Admitted initially for epidural abscess  Physical Exam: Vitals:   04/19/22 1943 04/20/22 1610 04/20/22 2144 04/21/22 0804  BP: 122/82 114/70 103/68 100/66  Pulse: (!) 104 86 70 66  Resp: 18 16 16 15   Temp: 99.2 F (37.3 C) 98.7 F (37.1 C) 98.9 F (37.2 C) 98.3 F (36.8 C)  TempSrc: Axillary     SpO2: 100% 96% 96% 95%  Weight:      Height:       Physical Exam HENT:     Head: Normocephalic.     Ears:     Comments: Tympanic membrane no erythema.  No wax in his ears.    Mouth/Throat:     Pharynx: No oropharyngeal exudate.  Eyes:     General: Lids are normal.     Conjunctiva/sclera: Conjunctivae normal.  Cardiovascular:     Rate and Rhythm: Normal rate and regular rhythm.     Heart sounds: Normal heart sounds, S1 normal and S2 normal.  Pulmonary:     Breath sounds: No decreased breath sounds, wheezing, rhonchi or rales.  Abdominal:     Palpations: Abdomen is soft.     Tenderness: There is no abdominal tenderness.  Musculoskeletal:     Right lower leg: Swelling present.      Left lower leg: Swelling present.  Skin:    General: Skin is warm.     Comments:    Neurological:     Mental Status: He is alert and oriented to person, place, and time.     Data Reviewed: AST 1451, ALT 542, total bilirubin 3.7, hemoglobin 10.5  Disposition: Status is: Inpatient Remains inpatient appropriate because:   Planned Discharge Destination: To the street.    Time spent: 28 minutes  Author: , MD 04/21/2022 3:04 PM  For on call review [email or web address].

## 2022-04-21 NOTE — Progress Notes (Signed)
GI Inpatient Follow-up Note  Subjective:  Patient seen and somewhat anxious appearing but no altered mental status  Scheduled Inpatient Medications:   buprenorphine-naloxone  1 tablet Sublingual Q12H   clotrimazole  1 Application Topical BID   ferrous sulfate  325 mg Oral Q breakfast   furosemide  40 mg Oral Daily   gabapentin  300 mg Oral TID   polyethylene glycol  17 g Oral Daily   potassium chloride  20 mEq Oral Daily   senna-docusate  1 tablet Oral BID   sorbitol  30 mL Oral Daily    Continuous Inpatient Infusions:    sodium chloride Stopped (04/11/22 0046)    ceFAZolin (ANCEF) IV 2 g (04/21/22 1350)    PRN Inpatient Medications:  sodium chloride, bisacodyl, lactulose, ondansetron **OR** ondansetron (ZOFRAN) IV, mouth rinse, polyvinyl alcohol  Review of Systems:  Review of Systems  Constitutional:  Negative for chills and fever.  Respiratory:  Negative for shortness of breath.   Cardiovascular:  Negative for chest pain.  Gastrointestinal:  Negative for abdominal pain, blood in stool, constipation, diarrhea, melena, nausea and vomiting.  Genitourinary:  Negative for dysuria.  Musculoskeletal:  Positive for back pain.  Skin:  Negative for itching and rash.  Neurological:  Negative for focal weakness.  Psychiatric/Behavioral:  The patient has insomnia.   All other systems reviewed and are negative.     Physical Examination: BP 100/66 (BP Location: Left Arm)   Pulse 66   Temp 98.3 F (36.8 C)   Resp 15   Ht 6\' 4"  (1.93 m)   Wt 88.5 kg   SpO2 95%   BMI 23.75 kg/m  Gen: NAD, alert and oriented x 4 HEENT: PEERLA, EOMI, Neck: supple, no JVD or thyromegaly Chest: No respiratory distress Abd: soft, non-tender, non-distended Ext: no edema, well perfused with 2+ pulses, Skin: no rash or lesions noted Lymph: no LAD  Data: Lab Results  Component Value Date   WBC 2.4 (L) 04/21/2022   HGB 10.5 (L) 04/21/2022   HCT 34.6 (L) 04/21/2022   MCV 88.5 04/21/2022    PLT 185 04/21/2022   Recent Labs  Lab 04/21/22 0619  HGB 10.5*   Lab Results  Component Value Date   NA 139 04/21/2022   K 4.1 04/21/2022   CL 110 04/21/2022   CO2 25 04/21/2022   BUN 12 04/21/2022   CREATININE 0.68 04/21/2022   Lab Results  Component Value Date   ALT 542 (H) 04/21/2022   AST 1,451 (H) 04/21/2022   ALKPHOS 170 (H) 04/21/2022   BILITOT 3.7 (H) 04/21/2022   Recent Labs  Lab 04/20/22 0544  INR 1.1   Assessment/Plan: Derek Dunn is a 51 y.o. gentleman with history of IVDU with MSSA osteomyelitis and acute hepatitis B. Given no evidence of liver failure there is no current indication for treating hepatitis B. No evidence of cirrhosis. Greater than 95% of the time there is spontaneous clearance of hepatitis B and only 5% of the time it becomes chronic. There is a < 1% chance of acute hepatic failure from hepatitis B. Liver enzymes in the thousands are common for acute hepatitis B. The most important thing to monitor is his INR and mental status. Acute liver failure is defined as INR > 1.5 and evidence of HE. Seems that his liver enzymes have potentially plateaued.  Recommendations:  - continue to monitor liver enzymes and check daily INR -  f/u hepatitis D - continue supportive care - will need outpatient f/u to  document clearance   Dr. Timothy Lasso is covering this weekend but will be following peripherally, please call with any questions or concerns.  Merlyn Lot MD, MPH [facility]

## 2022-04-22 DIAGNOSIS — R7401 Elevation of levels of liver transaminase levels: Secondary | ICD-10-CM | POA: Diagnosis not present

## 2022-04-22 DIAGNOSIS — G9341 Metabolic encephalopathy: Secondary | ICD-10-CM | POA: Diagnosis not present

## 2022-04-22 DIAGNOSIS — G062 Extradural and subdural abscess, unspecified: Secondary | ICD-10-CM | POA: Diagnosis not present

## 2022-04-22 DIAGNOSIS — M4620 Osteomyelitis of vertebra, site unspecified: Secondary | ICD-10-CM | POA: Diagnosis not present

## 2022-04-22 LAB — COMPREHENSIVE METABOLIC PANEL
ALT: 592 U/L — ABNORMAL HIGH (ref 0–44)
AST: 1523 U/L — ABNORMAL HIGH (ref 15–41)
Albumin: 2.8 g/dL — ABNORMAL LOW (ref 3.5–5.0)
Alkaline Phosphatase: 159 U/L — ABNORMAL HIGH (ref 38–126)
Anion gap: 3 — ABNORMAL LOW (ref 5–15)
BUN: 13 mg/dL (ref 6–20)
CO2: 25 mmol/L (ref 22–32)
Calcium: 8.9 mg/dL (ref 8.9–10.3)
Chloride: 109 mmol/L (ref 98–111)
Creatinine, Ser: 0.76 mg/dL (ref 0.61–1.24)
GFR, Estimated: >60 mL/min (ref >60)
Glucose, Bld: 118 mg/dL — ABNORMAL HIGH (ref 70–99)
Potassium: 4 mmol/L (ref 3.5–5.1)
Sodium: 137 mmol/L (ref 135–145)
Total Bilirubin: 4.1 mg/dL — ABNORMAL HIGH (ref 0.3–1.2)
Total Protein: 6.7 g/dL (ref 6.5–8.1)

## 2022-04-22 LAB — PROTIME-INR
INR: 1.1 (ref 0.8–1.2)
Prothrombin Time: 14.4 seconds (ref 11.4–15.2)

## 2022-04-22 MED ORDER — FUROSEMIDE 20 MG PO TABS
20.0000 mg | ORAL_TABLET | Freq: Every day | ORAL | Status: DC
  Administered 2022-04-22 – 2022-04-24 (×3): 20 mg via ORAL
  Filled 2022-04-22 (×3): qty 1

## 2022-04-22 MED ORDER — TRAZODONE HCL 50 MG PO TABS
50.0000 mg | ORAL_TABLET | Freq: Every day | ORAL | Status: DC
  Administered 2022-04-22 – 2022-04-25 (×4): 50 mg via ORAL
  Filled 2022-04-22 (×4): qty 1

## 2022-04-22 NOTE — Progress Notes (Signed)
Progress Note   Patient: Derek Dunn QMG:[id number] DOB: October 03, 1970 DOA: 03/15/2022     38 DOS: the patient was seen and examined on 04/22/2022   Brief hospital course: Derek Dunn is a 51 year old male with PMH polysubstance abuse including IV drug use in the past, housing insecurity (incarcerated past 2 months, just released (as of 7/12), hospital admission in May 2023 with MSSA bacteremia secondary to spinal osteomyelitis and epidural abscess, recurrent right inguinal hernia, history of signing out AMA.  He presented to the ED from [facility] on 03/15/2022 with complaints of worsening back pain and recurrent right groin pain.  No reported fevers.  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  Labs were mostly unremarkable including no leukocytosis and normal lactic acid.  Sepsis ruled out.  Patient was started on broad-spectrum IV antibiotic coverage and admitted for further evaluation and management.  Interventional radiology was consulted for abscess aspiration for cultures, performed on 7/12. Infectious disease was also consulted.   Neurosurgery consulted for consideration of washout in the OR, given poor likelihood of full resolution with antibiotics alone. He was taken to the OR for washout on 03/24/22.  Underwent posterior laminectomy T8-9 for evacuation of epidural abscess, posterolateral arthrodesis T7 to T12 with T7-12 posterior instrumentation.   The patient will remain in the hospital to 04/26/2022 for IV antibiotics.  Patient converted to Suboxone on 04/12/2022.  Patient had some altered mental status on 04/19/2022 to 04/20/2022.  Reviewed in nursing notes.  Urine toxicology positive for amphetamines and cannabis.  Assessment and Plan: * Acute metabolic encephalopathy Mental status at baseline.  CT scan of the head negative.  Urine toxicology on  04/20/2022 positive for amphetamines and cannabis.  Reviewed nursing notes from last night.  Telemetry sitter will be continued through the rest of the hospital stay.  Transaminitis Likely secondary to acute hepatitis B infection. LFTs continue to rise with AST 1523, ALT 592 and bilirubin of 4.1.  last INR 1.1.  The patient does not want to go down on his gabapentin dose any further.  Septic arthritis (HCC) Signs of septic arthritis at level of L5-S1 seen on MRI lumbar spine.  Management as outlined, see epidural abscess.  Discitis thoracic region See epidural abscess for plan  Epidural abscess Acute osteomyelitis discitis and facet joint septic arthritis at T8-9 with epidural abscess. Severe spinal stenosis at T8-T9. Acute septic arthritis at the left L5-S1 facet. History of IV drug use. Prior history MSSA bacteremia. --Initially on IV Cefepime, Vancomycin -- IR consulted, performed CT-guided aspiration for culture on 7/12 --Infectious disease consulted  --Was on nafcillin continuous IV infusion --Antibiotic changed to cefazolin on 7/24 due to rising LFTs --Neurosurgery consulted.  Patient underwent T7-12 posterior instrumentation with decompression and washout of epidural abscess at T8-9 (performed on 03/24/22) -- Drain removed on 03/27/2022  -- Wound VAC removed 7/26 --Neurosurgery recommends: Mobilize, pain control, resume DVT prophylaxis, PT/OT -- Follow-up with neurosurgery in 2 weeks  7/28: per Neurosurgery, pt no longer needs dressing over incision.  May shower and pat incision area dry.  Patient will remain in the hospital for IV antibiotics through 04/26/2022.  Vertebral osteomyelitis (HCC) Management as outlined  Back pain Patient converted to Suboxone on 04/12/2022.  Will need an outpatient Suboxone clinic upon disposition.  This was discussed with TOC.  Fever Temp 100.4 noted on evening of 7/26. Secondary to acute hepatitis  B infection  Acute hepatitis B Acute  hepatitis B infection with viral DNA greater than 1 billion.  Need to check antibodies as outpatient to see if he clears this infection.  Follow-up with GI as outpatient.  Right inguinal hernia -repeat CT abd/pelvis on the 28th showed a persistent large right inguinal hernia with no evidence of bowel obstruction.  Advised he will need general surgery as outpatient.    Hypotension BP as low as 87/61 on AM of 7/13, suspect due to pain medications.  Otherwise no signs of sepsis.   Insomnia Trazodone at bedtime ordered.  Stage II decubitus ulcer (HCC) Right buttock.  This has healed.  Anemia of chronic disease Last hemoglobin 10.5.  Ferritin elevated above 300.  Hepatitis C antibody test positive HCV Ab reactive this admission.  Hepatitis C RNA not detected.  Likely has cleared this infection.        Subjective: Patient wants to go back on trazodone and states that he has not been sleeping.  Still having back pain.  Initially admitted with epidural abscess.  Physical Exam: Vitals:   04/21/22 1604 04/21/22 2208 04/22/22 0616 04/22/22 0825  BP: 98/67 102/64 (!) 93/51 101/68  Pulse: 68 74 71 66  Resp: 16 18 16 17   Temp: 98 F (36.7 C) 98.2 F (36.8 C) 97.9 F (36.6 C) 98.3 F (36.8 C)  TempSrc:    Oral  SpO2: 94% 97% 100% 95%  Weight:      Height:       Physical Exam HENT:     Head: Normocephalic.     Mouth/Throat:     Pharynx: No oropharyngeal exudate.  Eyes:     General: Lids are normal.     Conjunctiva/sclera: Conjunctivae normal.  Cardiovascular:     Rate and Rhythm: Normal rate and regular rhythm.     Heart sounds: Normal heart sounds, S1 normal and S2 normal.  Pulmonary:     Breath sounds: No decreased breath sounds, wheezing, rhonchi or rales.  Abdominal:     Palpations: Abdomen is soft.     Tenderness: There is no abdominal tenderness.  Musculoskeletal:     Right lower leg: Swelling present.     Left lower leg: Swelling present.  Skin:    General:  Skin is warm.     Comments: Skin on back is healed.   Neurological:     Mental Status: He is alert and oriented to person, place, and time.     Data Reviewed: AST 1523, ALT 592, total bilirubin 4.1, INR 1.1  Disposition: Status is: Inpatient Remains inpatient appropriate because: ID recommends antibiotics to 04/26/2022  Planned Discharge Destination: To the street.    Time spent: 27 are minutes  Author: 28, MD 04/22/2022 2:46 PM  For on call review [email or web address].

## 2022-04-23 DIAGNOSIS — R7401 Elevation of levels of liver transaminase levels: Secondary | ICD-10-CM | POA: Diagnosis not present

## 2022-04-23 DIAGNOSIS — M4644 Discitis, unspecified, thoracic region: Secondary | ICD-10-CM | POA: Diagnosis not present

## 2022-04-23 DIAGNOSIS — G9341 Metabolic encephalopathy: Secondary | ICD-10-CM | POA: Diagnosis not present

## 2022-04-23 DIAGNOSIS — G062 Extradural and subdural abscess, unspecified: Secondary | ICD-10-CM | POA: Diagnosis not present

## 2022-04-23 MED ORDER — ENOXAPARIN SODIUM 40 MG/0.4ML IJ SOSY
40.0000 mg | PREFILLED_SYRINGE | INTRAMUSCULAR | Status: DC
  Filled 2022-04-23 (×2): qty 0.4

## 2022-04-23 NOTE — Progress Notes (Signed)
Progress Note   Patient: Derek Dunn EUM:[id number] DOB: [birth year] DOA: 03/15/2022     39 DOS: the patient was seen and examined on 04/23/2022   Brief hospital course: Derek Dunn is a 50 year old male with PMH polysubstance abuse including IV drug use in the past, housing insecurity (incarcerated past 2 months, just released (as of 7/12), hospital admission in May 2023 with MSSA bacteremia secondary to spinal osteomyelitis and epidural abscess, recurrent right inguinal hernia, history of signing out AMA.  He presented to the ED from [facility] on 03/15/2022 with complaints of worsening back pain and recurrent right groin pain.  No reported fevers.  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  Labs were mostly unremarkable including no leukocytosis and normal lactic acid.  Sepsis ruled out.  Patient was started on broad-spectrum IV antibiotic coverage and admitted for further evaluation and management.  Interventional radiology was consulted for abscess aspiration for cultures, performed on 7/12. Infectious disease was also consulted.   Neurosurgery consulted for consideration of washout in the OR, given poor likelihood of full resolution with antibiotics alone. He was taken to the OR for washout on 03/24/22.  Underwent posterior laminectomy T8-9 for evacuation of epidural abscess, posterolateral arthrodesis T7 to T12 with T7-12 posterior instrumentation.   The patient will remain in the hospital to 04/26/2022 for IV antibiotics.  Patient converted to Suboxone on 04/12/2022.  Patient had some altered mental status on 04/19/2022 to 04/20/2022.  Reviewed in nursing notes.  Urine toxicology positive for amphetamines and cannabis.  Assessment and Plan: * Acute metabolic encephalopathy Mental status at baseline.  CT scan of the head negative.  Urine toxicology on  04/20/2022 positive for amphetamines and cannabis.  Reviewed nursing notes from last night.  Telemetry sitter will be continued through the rest of the hospital stay.  Transaminitis Likely secondary to acute hepatitis B infection. LFTs continue to rise with AST 1523, ALT 592 and bilirubin of 4.1.  last INR 1.1.  The patient does not want to go down on his gabapentin dose any further.  Recheck liver function test and INR tomorrow  Septic arthritis (HCC) Signs of septic arthritis at level of L5-S1 seen on MRI lumbar spine.  Management as outlined, see epidural abscess.  Discitis thoracic region See epidural abscess for plan  Epidural abscess Acute osteomyelitis discitis and facet joint septic arthritis at T8-9 with epidural abscess. Severe spinal stenosis at T8-T9. Acute septic arthritis at the left L5-S1 facet. History of IV drug use. Prior history MSSA bacteremia. --Initially on IV Cefepime, Vancomycin -- IR consulted, performed CT-guided aspiration for culture on 7/12 --Infectious disease consulted  --Was on nafcillin continuous IV infusion --Antibiotic changed to cefazolin on 7/24 due to rising LFTs --Neurosurgery consulted.  Patient underwent T7-12 posterior instrumentation with decompression and washout of epidural abscess at T8-9 (performed on 03/24/22) -- Drain removed on 03/27/2022  -- Wound VAC removed 7/26 --Neurosurgery recommends: Mobilize, pain control, resume DVT prophylaxis, PT/OT -- Follow-up with neurosurgery in 2 weeks  7/28: per Neurosurgery, pt no longer needs dressing over incision.  May shower and pat incision area dry.  Patient will remain in the hospital for IV antibiotics through 04/26/2022.  Vertebral osteomyelitis (HCC) Management as outlined  Back pain Patient converted to Suboxone on 04/12/2022.  Will need an outpatient Suboxone clinic upon disposition.  This was discussed with TOC.  Fever Temp 100.4 noted  on evening of 7/26. Secondary to acute hepatitis  B infection  Acute hepatitis B Acute hepatitis B infection with viral DNA greater than 1 billion.  Need to check antibodies as outpatient to see if he clears this infection.  Follow-up with GI as outpatient.  Right inguinal hernia -repeat CT abd/pelvis on the 28th showed a persistent large right inguinal hernia with no evidence of bowel obstruction.  Advised he will need general surgery as outpatient.    Hypotension BP as low as 87/61 on AM of 7/13, suspect due to pain medications.  Otherwise no signs of sepsis.   Insomnia Trazodone at bedtime ordered.  Stage II decubitus ulcer (HCC) Right buttock.  This has healed.  Anemia of chronic disease Last hemoglobin 10.5.  Ferritin elevated above 300.  Hepatitis C antibody test positive HCV Ab reactive this admission.  Hepatitis C RNA not detected.  Likely has cleared this infection.   Patient states he has not walked in 3 days.  Will start Lovenox injections.     Subjective: Patient complaining of back pain.  He states he has not been walking around the last 3 days.  He states his hernia has been hurting him.  Admitted 39 days ago with epidural abscess.  Physical Exam: Vitals:   04/22/22 2107 04/23/22 0508 04/23/22 0813 04/23/22 1606  BP: 109/67 100/62 107/72 97/72  Pulse: 65 61 (!) 57 (!) 58  Resp: 18 16 17 18   Temp: 98.8 F (37.1 C) 98.1 F (36.7 C) 98.6 F (37 C) 98.4 F (36.9 C)  TempSrc:  Oral Oral Oral  SpO2: 100% 97% 97% 98%  Weight:      Height:       Physical Exam HENT:     Head: Normocephalic.     Mouth/Throat:     Pharynx: No oropharyngeal exudate.  Eyes:     General: Lids are normal.     Conjunctiva/sclera: Conjunctivae normal.  Cardiovascular:     Rate and Rhythm: Normal rate and regular rhythm.     Heart sounds: Normal heart sounds, S1 normal and S2 normal.  Pulmonary:     Breath sounds: No decreased breath sounds, wheezing, rhonchi or rales.  Abdominal:     Palpations: Abdomen is soft.      Tenderness: There is no abdominal tenderness.  Musculoskeletal:     Right lower leg: Swelling present.     Left lower leg: Swelling present.  Skin:    General: Skin is warm.     Comments: Skin on back is healed.   Neurological:     Mental Status: He is alert and oriented to person, place, and time.     Data Reviewed: Last AST 1523 last ALT 592 last bilirubin 4.1 last INR 1.1  Disposition: Status is: Inpatient Remains inpatient appropriate because: As per infectious disease, patient will receive IV antibiotics to the day of discharge of 04/26/2022  Planned Discharge Destination: To the street    Time spent: 27 minutes  Author: 04/28/2022, MD 04/23/2022 4:20 PM  For on call review [email or web address].

## 2022-04-24 DIAGNOSIS — M0008 Staphylococcal arthritis, vertebrae: Secondary | ICD-10-CM | POA: Diagnosis not present

## 2022-04-24 DIAGNOSIS — M4644 Discitis, unspecified, thoracic region: Secondary | ICD-10-CM | POA: Diagnosis not present

## 2022-04-24 DIAGNOSIS — B169 Acute hepatitis B without delta-agent and without hepatic coma: Secondary | ICD-10-CM | POA: Diagnosis not present

## 2022-04-24 DIAGNOSIS — G9341 Metabolic encephalopathy: Secondary | ICD-10-CM | POA: Diagnosis not present

## 2022-04-24 DIAGNOSIS — R7401 Elevation of levels of liver transaminase levels: Secondary | ICD-10-CM | POA: Diagnosis not present

## 2022-04-24 DIAGNOSIS — G062 Extradural and subdural abscess, unspecified: Secondary | ICD-10-CM | POA: Diagnosis not present

## 2022-04-24 LAB — COMPREHENSIVE METABOLIC PANEL
ALT: 800 U/L — ABNORMAL HIGH (ref 0–44)
AST: 2299 U/L — ABNORMAL HIGH (ref 15–41)
Albumin: 2.9 g/dL — ABNORMAL LOW (ref 3.5–5.0)
Alkaline Phosphatase: 169 U/L — ABNORMAL HIGH (ref 38–126)
Anion gap: 7 (ref 5–15)
BUN: 15 mg/dL (ref 6–20)
CO2: 25 mmol/L (ref 22–32)
Calcium: 9.4 mg/dL (ref 8.9–10.3)
Chloride: 107 mmol/L (ref 98–111)
Creatinine, Ser: 0.74 mg/dL (ref 0.61–1.24)
GFR, Estimated: >60 mL/min (ref >60)
Glucose, Bld: 112 mg/dL — ABNORMAL HIGH (ref 70–99)
Potassium: 4.3 mmol/L (ref 3.5–5.1)
Sodium: 139 mmol/L (ref 135–145)
Total Bilirubin: 6.1 mg/dL — ABNORMAL HIGH (ref 0.3–1.2)
Total Protein: 7.1 g/dL (ref 6.5–8.1)

## 2022-04-24 LAB — PROTIME-INR
INR: 1.2 (ref 0.8–1.2)
Prothrombin Time: 15 seconds (ref 11.4–15.2)

## 2022-04-24 LAB — HEMOGLOBIN: Hemoglobin: 11.6 g/dL — ABNORMAL LOW (ref 13.0–17.0)

## 2022-04-24 MED ORDER — SODIUM CHLORIDE 0.9 % IV SOLN: INTRAVENOUS | Status: DC

## 2022-04-24 NOTE — Progress Notes (Signed)
Progress Note   Patient: Derek Dunn PPJ:[id number] DOB: 08-Aug-1971 DOA: 03/15/2022     40 DOS: the patient was seen and examined on 04/24/2022   Brief hospital course: Derek Dunn is a 51 year old male with PMH polysubstance abuse including IV drug use in the past, housing insecurity (incarcerated past 2 months, just released (as of 7/12), hospital admission in May 2023 with MSSA bacteremia secondary to spinal osteomyelitis and epidural abscess, recurrent right inguinal hernia, history of signing out AMA.  He presented to the ED from [facility] on 03/15/2022 with complaints of worsening back pain and recurrent right groin pain.  No reported fevers.  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  Labs were mostly unremarkable including no leukocytosis and normal lactic acid.  Sepsis ruled out.  Patient was started on broad-spectrum IV antibiotic coverage and admitted for further evaluation and management.  Interventional radiology was consulted for abscess aspiration for cultures, performed on 7/12. Infectious disease was also consulted.   Neurosurgery consulted for consideration of washout in the OR, given poor likelihood of full resolution with antibiotics alone. He was taken to the OR for washout on 03/24/22.  Underwent posterior laminectomy T8-9 for evacuation of epidural abscess, posterolateral arthrodesis T7 to T12 with T7-12 posterior instrumentation.   The patient will remain in the hospital to 04/26/2022 for IV antibiotics.  Patient converted to Suboxone on 04/12/2022.  Patient had some altered mental status on 04/19/2022 to 04/20/2022.  Reviewed in nursing notes.  Urine toxicology positive for amphetamines and cannabis.  As of 04/24/2022 liver function test still rising.  Gastroenterology recommends IV fluids to see if this will help.  I discontinued  gabapentin.  Assessment and Plan: * Transaminitis Likely secondary to acute hepatitis B infection. LFTs continue to rise with AST 2299, ALT 800 and bilirubin of 6.1.  last INR 1.2.  The patient agreeable to come off the gabapentin at this point.  Does not want to come down on the Suboxone.  Case discussed with gastroenterology and recommended starting IV fluids.  Acute metabolic encephalopathy Mental status at baseline at this point.  CT scan of the head negative.  Urine toxicology on 04/20/2022 positive for amphetamines and cannabis.  Reviewed nursing notes from last night.  Telemetry sitter will be continued through the rest of the hospital stay.  Epidural abscess Acute osteomyelitis discitis and facet joint septic arthritis at T8-9 with epidural abscess. Severe spinal stenosis at T8-T9. Acute septic arthritis at the left L5-S1 facet. History of IV drug use. Prior history MSSA bacteremia. --Initially on IV Cefepime, Vancomycin -- IR consulted, performed CT-guided aspiration for culture on 7/12 --Infectious disease consulted  --Was on nafcillin continuous IV infusion --Antibiotic changed to cefazolin on 7/24 due to rising LFTs --Neurosurgery consulted.  Patient underwent T7-12 posterior instrumentation with decompression and washout of epidural abscess at T8-9 (performed on 03/24/22) -- Drain removed on 03/27/2022  -- Wound VAC removed 7/26 --Neurosurgery recommends: Mobilize, pain control, resume DVT prophylaxis, PT/OT -- Follow-up with neurosurgery in 2 weeks  7/28: per Neurosurgery, pt no longer needs dressing over incision.  May shower and pat incision area dry.  Patient will remain in the hospital for IV antibiotics through 04/26/2022.  Septic arthritis (HCC) Signs of septic arthritis at level of L5-S1 seen on MRI lumbar spine.  Management as outlined, see epidural abscess.  Discitis thoracic region See epidural abscess for plan  Vertebral osteomyelitis (HCC) Management as  outlined  Back pain Patient converted to Suboxone on 04/12/2022.  Will need an outpatient Suboxone clinic upon disposition.  This was discussed with TOC.  Fever Temp 100.4 noted on evening of 7/26. Secondary to acute hepatitis B infection  Acute hepatitis B Acute hepatitis B infection with viral DNA greater than 1 billion.  Need to check antibodies as outpatient to see if he clears this infection.  Follow-up with GI as outpatient.  Right inguinal hernia -repeat CT abd/pelvis on the 28th showed a persistent large right inguinal hernia with no evidence of bowel obstruction.  Advised he will need general surgery as outpatient.    Hypotension BP as low as 87/61 on AM of 7/13, suspect due to pain medications.  Otherwise no signs of sepsis.   Insomnia Trazodone at bedtime ordered.  Stage II decubitus ulcer (HCC) Right buttock.  This has healed.  Anemia of chronic disease Last hemoglobin 10.5.  Ferritin elevated above 300.  Hepatitis C antibody test positive HCV Ab reactive this admission.  Hepatitis C RNA not detected.  Likely has cleared this infection.        Subjective: With increasing liver function test the patient was agreeable to stop gabapentin but did not want to stop the Suboxone at this point.  Gastroenterology recommended IV fluids.  Patient agreeable for IV fluids.  Initially admitted for epidural abscess.  Physical Exam: Vitals:   04/23/22 1606 04/23/22 2037 04/24/22 0611 04/24/22 1613  BP: 97/72 107/69 110/71 99/71  Pulse: (!) 58 (!) 57 60 60  Resp: 18 16 18 18   Temp: 98.4 F (36.9 C) 98.4 F (36.9 C) 98.1 F (36.7 C) 99 F (37.2 C)  TempSrc: Oral     SpO2: 98% 97% 94% 100%  Weight:      Height:       Physical Exam HENT:     Head: Normocephalic.     Mouth/Throat:     Pharynx: No oropharyngeal exudate.  Eyes:     General: Lids are normal.     Conjunctiva/sclera: Conjunctivae normal.  Cardiovascular:     Rate and Rhythm: Normal rate and regular  rhythm.     Heart sounds: Normal heart sounds, S1 normal and S2 normal.  Pulmonary:     Breath sounds: No decreased breath sounds, wheezing, rhonchi or rales.  Abdominal:     Palpations: Abdomen is soft.     Tenderness: There is no abdominal tenderness.  Musculoskeletal:     Right lower leg: Swelling present.     Left lower leg: Swelling present.  Skin:    General: Skin is warm.     Comments: Skin on back is healed.   Neurological:     Mental Status: He is alert and oriented to person, place, and time.     Data Reviewed: AST 2299, ALT 800, total bilirubin 6.1, INR 1.2, hemoglobin 11.6  Disposition: Status is: Inpatient Remains inpatient appropriate because: As per infectious disease remain on IV antibiotics to 04/26/2022.  With liver function test rising gastroenterology wanted to start IV fluids  Planned Discharge Destination: Home    Time spent: 28 minutes Case discussed with gastroenterology and ID Author: 04/28/2022, MD 04/24/2022 4:34 PM  For on call review [email or web address].

## 2022-04-24 NOTE — Plan of Care (Signed)
Patient remained free of falls this shift. Patient ate well throughout shift. Ambulatory, independent. Voiding. No BM this shift. MIVF started at 46ml. Patient refused lovenox injection, provider notified. PIV dressing changed, intact. Will continue to monitor.

## 2022-04-24 NOTE — Progress Notes (Signed)
ID Pt doing okay No pain back  O/e awake and alert Patient Vitals for the past 24 hrs:  BP Temp Temp src Pulse Resp SpO2  04/24/22 0611 110/71 98.1 F (36.7 C) -- 60 18 94 %  04/23/22 2037 107/69 98.4 F (36.9 C) -- (!) 57 16 97 %  04/23/22 1606 97/72 98.4 F (36.9 C) Oral (!) 58 18 98 %   Chest cta Hs s1s2 Abd soft Spinal surgical site- clean  Labs    Latest Ref Rng & Units 04/24/2022    6:37 AM 04/21/2022    6:19 AM 04/14/2022    4:45 AM  CBC  WBC 4.0 - 10.5 K/uL  2.4  2.7   Hemoglobin 13.0 - 17.0 g/dL 65.7  84.6  96.2   Hematocrit 39.0 - 52.0 %  34.6  34.6   Platelets 150 - 400 K/uL  185  242        Latest Ref Rng & Units 04/24/2022    6:37 AM 04/22/2022    6:57 AM 04/21/2022    6:19 AM  CMP  Glucose 70 - 99 mg/dL 952  841  324   BUN 6 - 20 mg/dL 15  13  12    Creatinine 0.61 - 1.24 mg/dL  4.01  0.27   Sodium 135 - 145 mmol/L 139  137  139   Potassium 3.5 - 5.1 mmol/L 4.3  4.0  4.1   Chloride 98 - 111 mmol/L 107  109  110   CO2 22 - 32 mmol/L 25  25  25    Calcium 8.9 - 10.3 mg/dL 9.4  8.9  9.1   Total Protein 6.5 - 8.1 g/dL 7.1  6.7  6.8   Total Bilirubin 0.3 - 1.2 mg/dL 6.1  4.1  3.7   Alkaline Phos 38 - 126 U/L 169  159  170   AST 15 - 41 U/L 2,299  1,523  1,451   ALT 0 - 44 U/L 800  592  542      Impression/recommendation MSSA thoracic vertebral infection - s/p fusion Pt is on cefazolin -  rifampin cannot be added due to acute hepb hepatitis On discharge will do 2 more weeks of Po antibiotic- keflex 500mg  Po Q6  Acute HepB hepatitis with high Vl and worsening LFT Coag screen okay Avoid hepatotoxic drugs including gabapentin GI following   Leucopenia due to hepb viral infection-improving  HEPC -spontaneous clearance   Discussed the management with patient and hospitalist

## 2022-04-24 NOTE — TOC Progression Note (Signed)
Transition of Care [facility]) - Progression Note    Patient Details  Name: Derek Dunn MRN: [id number] Date of Birth: 12/16/1970  Transition of Care [facility] CM/SW Contact  Caryn Section, RN Phone Number: 04/24/2022, 3:25 PM  Clinical Narrative:   RNCM in to speak to patient.  He states that he may discharge to stay with grandmother or aunt, unsure at this time.  RNCM offered to arrange discharge with a local suboxone clinic and patient refused.  Patient asked RNCM to return, as he was tired at this time.         Expected Discharge Plan and Services                                                 Social Determinants of Health (SDOH) Interventions    Readmission Risk Interventions     No data to display

## 2022-04-25 DIAGNOSIS — R7401 Elevation of levels of liver transaminase levels: Secondary | ICD-10-CM | POA: Diagnosis not present

## 2022-04-25 DIAGNOSIS — B169 Acute hepatitis B without delta-agent and without hepatic coma: Secondary | ICD-10-CM | POA: Diagnosis not present

## 2022-04-25 DIAGNOSIS — G062 Extradural and subdural abscess, unspecified: Secondary | ICD-10-CM | POA: Diagnosis not present

## 2022-04-25 DIAGNOSIS — G9341 Metabolic encephalopathy: Secondary | ICD-10-CM | POA: Diagnosis not present

## 2022-04-25 LAB — COMPREHENSIVE METABOLIC PANEL
ALT: 923 U/L — ABNORMAL HIGH (ref 0–44)
AST: 2524 U/L — ABNORMAL HIGH (ref 15–41)
Albumin: 2.9 g/dL — ABNORMAL LOW (ref 3.5–5.0)
Alkaline Phosphatase: 158 U/L — ABNORMAL HIGH (ref 38–126)
Anion gap: 5 (ref 5–15)
BUN: 12 mg/dL (ref 6–20)
CO2: 26 mmol/L (ref 22–32)
Calcium: 9.3 mg/dL (ref 8.9–10.3)
Chloride: 108 mmol/L (ref 98–111)
Creatinine, Ser: 0.78 mg/dL (ref 0.61–1.24)
GFR, Estimated: >60 mL/min (ref >60)
Glucose, Bld: 113 mg/dL — ABNORMAL HIGH (ref 70–99)
Potassium: 4.1 mmol/L (ref 3.5–5.1)
Sodium: 139 mmol/L (ref 135–145)
Total Bilirubin: 7 mg/dL — ABNORMAL HIGH (ref 0.3–1.2)
Total Protein: 7.1 g/dL (ref 6.5–8.1)

## 2022-04-25 LAB — C-REACTIVE PROTEIN: CRP: 0.7 mg/dL (ref <1.0)

## 2022-04-25 LAB — PROTIME-INR
INR: 1.2 (ref 0.8–1.2)
Prothrombin Time: 15.3 seconds — ABNORMAL HIGH (ref 11.4–15.2)

## 2022-04-25 LAB — HEPATITIS B E ANTIGEN: Hep B E Ag: POSITIVE — AB

## 2022-04-25 LAB — SEDIMENTATION RATE: Sed Rate: 13 mm/hr (ref 0–20)

## 2022-04-25 MED ORDER — POLYETHYLENE GLYCOL 3350 17 G PO PACK: 30.0000 g | PACK | Freq: Every day | ORAL | 0 refills | Status: DC

## 2022-04-25 MED ORDER — POLYVINYL ALCOHOL 1.4 % OP SOLN: 1.0000 [drp] | OPHTHALMIC | 0 refills | Status: DC | PRN

## 2022-04-25 MED ORDER — BUPRENORPHINE HCL-NALOXONE HCL 8-2 MG SL SUBL: 1.0000 | SUBLINGUAL_TABLET | Freq: Two times a day (BID) | SUBLINGUAL | 0 refills | Status: DC

## 2022-04-25 MED ORDER — TRAZODONE HCL 50 MG PO TABS: 50.0000 mg | ORAL_TABLET | Freq: Every evening | ORAL | 0 refills | Status: DC | PRN

## 2022-04-25 MED ORDER — CLOTRIMAZOLE 1 % EX CREA: 1.0000 | TOPICAL_CREAM | Freq: Two times a day (BID) | CUTANEOUS | 0 refills | Status: DC

## 2022-04-25 NOTE — Progress Notes (Signed)
GI Inpatient Follow-up Note  Subjective:  Patient seen and is stable except liver enzymes continue to rise but INR is less than 1.5 and mental status intact.  Scheduled Inpatient Medications:   buprenorphine-naloxone  1 tablet Sublingual Q12H   clotrimazole  1 Application Topical BID   enoxaparin (LOVENOX) injection  40 mg Subcutaneous Q24H   polyethylene glycol  17 g Oral Daily   senna-docusate  1 tablet Oral BID   sorbitol  30 mL Oral Daily   traZODone  50 mg Oral QHS    Continuous Inpatient Infusions:    sodium chloride Stopped (04/23/22 1717)   sodium chloride 75 mL/hr at 04/25/22 0553    ceFAZolin (ANCEF) IV 2 g (04/25/22 0554)    PRN Inpatient Medications:  sodium chloride, bisacodyl, lactulose, ondansetron **OR** ondansetron (ZOFRAN) IV, mouth rinse, polyvinyl alcohol  Review of Systems:  Review of Systems  Constitutional:  Negative for chills and fever.  Respiratory:  Negative for cough and shortness of breath.   Cardiovascular:  Negative for chest pain.  Gastrointestinal:  Negative for abdominal pain, blood in stool, constipation, diarrhea, melena, nausea and vomiting.  Musculoskeletal:  Positive for back pain.  Skin:  Negative for rash.  Neurological:  Negative for focal weakness.  Psychiatric/Behavioral:  Positive for substance abuse.   All other systems reviewed and are negative.    Physical Examination: BP 99/63   Pulse (!) 57   Temp 98 F (36.7 C)   Resp 16   Ht 6\' 4"  (1.93 m)   Wt 88.5 kg   SpO2 97%   BMI 23.75 kg/m  Gen: NAD, alert and oriented x 4 HEENT: PEERLA, EOMI, Neck: supple, no JVD or thyromegaly Chest: No respiratory distress CV: RRR, no m/g/c/r Abd: soft, non-tender, non-distended Ext: trace edema Skin: no rash or lesions noted Lymph: no LAD  Data: Lab Results  Component Value Date   WBC 2.4 (L) 04/21/2022   HGB 11.6 (L) 04/24/2022   HCT 34.6 (L) 04/21/2022   MCV 88.5 04/21/2022   PLT 185 04/21/2022   Recent Labs  Lab  04/21/22 0619 04/24/22 0637  HGB 10.5* 11.6*   Lab Results  Component Value Date   NA 139 04/25/2022   K 4.1 04/25/2022   CL 108 04/25/2022   CO2 26 04/25/2022   BUN 12 04/25/2022   CREATININE 0.78 04/25/2022   Lab Results  Component Value Date   ALT 923 (H) 04/25/2022   AST 2,524 (H) 04/25/2022   ALKPHOS 158 (H) 04/25/2022   BILITOT 7.0 (H) 04/25/2022   Recent Labs  Lab 04/25/22 0630  INR 1.2   Assessment/Plan: Mr. Dershem is a 51 y.o. gentleman with history of IVDU with MSSA osteomyelitis and acute hepatitis B. Given no evidence of liver failure there is no current indication for treating hepatitis B. No evidence of cirrhosis. Greater than 95% of the time there is spontaneous clearance of hepatitis B and only 5% of the time it becomes chronic. There is a < 1% chance of acute hepatic failure from hepatitis B. Liver enzymes in the thousands are common for acute hepatitis B. The most important thing to monitor is his INR and mental status. Acute liver failure is defined as INR > 1.5 and evidence of HE. Recommended continued hospital monitoring but patient is adamant that he is leaving tomorrow  Recommendations:  - continue to monitor liver enzymes and check daily INR -  f/u hepatitis D - continue supportive care - will need outpatient f/u to document  clearance - recommended further inpatient monitoring but patient is adamant that he is leaving tomorrow. Will arrange 1 week f/u next week to further monitor his liver enzymes.  Merlyn Lot MD, MPH [facility] GI

## 2022-04-25 NOTE — Progress Notes (Signed)
Progress Note   Patient: Derek Dunn YTK:[id number] DOB: [birth year] DOA: 03/15/2022     41 DOS: the patient was seen and examined on 04/25/2022   Brief hospital course: Mr. Delbene is a 51 year old male with PMH polysubstance abuse including IV drug use in the past, housing insecurity (incarcerated past 2 months, just released (as of 7/12), hospital admission in May 2023 with MSSA bacteremia secondary to spinal osteomyelitis and epidural abscess, recurrent right inguinal hernia, history of signing out AMA.  He presented to the ED from [facility] on 03/15/2022 with complaints of worsening back pain and recurrent right groin pain.  No reported fevers.  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  Labs were mostly unremarkable including no leukocytosis and normal lactic acid.  Sepsis ruled out.  Patient was started on broad-spectrum IV antibiotic coverage and admitted for further evaluation and management.  Interventional radiology was consulted for abscess aspiration for cultures, performed on 7/12. Infectious disease was also consulted.   Neurosurgery consulted for consideration of washout in the OR, given poor likelihood of full resolution with antibiotics alone. He was taken to the OR for washout on 03/24/22.  Underwent posterior laminectomy T8-9 for evacuation of epidural abscess, posterolateral arthrodesis T7 to T12 with T7-12 posterior instrumentation.   The patient will remain in the hospital to 04/26/2022 for IV antibiotics.  Patient converted to Suboxone on 04/12/2022.  Patient had some altered mental status on 04/19/2022 to 04/20/2022.  Reviewed in nursing notes.  Urine toxicology positive for amphetamines and cannabis.  As of 04/24/2022 liver function test still rising.  Gastroenterology recommends IV fluids to see if this will help.  I discontinued  gabapentin.  Assessment and Plan: * Transaminitis Likely secondary to acute hepatitis B infection. LFTs continue to rise with AST 2524, ALT 923 and bilirubin of 7.0, INR 1.2.  Gabapentin discontinued.  Does not want to come down on the Suboxone.  Case discussed with gastroenterology.  Gastroenterology would like the liver function test to stabilize prior to disposition.  IV fluids started yesterday.  Acute metabolic encephalopathy Mental status at baseline at this point.  CT scan of the head negative.  Urine toxicology on 04/20/2022 positive for amphetamines and cannabis.  Telemetry sitter will be continued through the rest of the hospital stay.  Epidural abscess Acute osteomyelitis discitis and facet joint septic arthritis at T8-9 with epidural abscess. Severe spinal stenosis at T8-T9. Acute septic arthritis at the left L5-S1 facet. History of IV drug use. Prior history MSSA bacteremia. --Initially on IV Cefepime, Vancomycin -- IR consulted, performed CT-guided aspiration for culture on 7/12 --Infectious disease consulted  --Was on nafcillin continuous IV infusion --Antibiotic changed to cefazolin on 7/24 due to rising LFTs --Neurosurgery consulted.  Patient underwent T7-12 posterior instrumentation with decompression and washout of epidural abscess at T8-9 (performed on 03/24/22) -- Drain removed on 03/27/2022  -- Wound VAC removed 7/26 --Neurosurgery recommends: Mobilize, pain control, resume DVT prophylaxis, PT/OT -- Follow-up with neurosurgery in 2 weeks  7/28: per Neurosurgery, pt no longer needs dressing over incision.  May shower and pat incision area dry.  Patient will remain in the hospital for IV antibiotics through 04/26/2022.  Septic arthritis (HCC) Signs of septic arthritis at level of L5-S1 seen on MRI lumbar spine.  Management as outlined, see epidural abscess.  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  Will need an outpatient Suboxone clinic upon disposition.  This was discussed with TOC.  Fever Temp 100.4 noted on evening of 7/26. Secondary to acute hepatitis B infection  Acute hepatitis B Acute hepatitis B infection with viral DNA greater than 1 billion.  Need to check antibodies as outpatient to see if he clears this infection.  Follow-up with GI as outpatient.  Right inguinal hernia -repeat CT abd/pelvis on the 28th showed a persistent large right inguinal hernia with no evidence of bowel obstruction.  Advised he will need general surgery as outpatient.    Hypotension BP as low as 87/61 on AM of 7/13, suspect due to pain medications.  Otherwise no signs of sepsis.   Insomnia Trazodone at bedtime ordered.  Stage II decubitus ulcer (HCC) Right buttock.  This has healed.  Anemia of chronic disease Last hemoglobin 10.5.  Ferritin elevated above 300.  Hepatitis C antibody test positive HCV Ab reactive this admission.  Hepatitis C RNA not detected.  Likely has cleared this infection.        Subjective: Patient feels okay.  He is wanting to get out of the hospital tomorrow and states his ride is coming between 12 noon and 3.  Physical Exam: Vitals:   04/24/22 1613 04/24/22 1958 04/25/22 0423 04/25/22 0827  BP: 99/71 104/73 103/73 99/63  Pulse: 60 (!) 57 62 (!) 57  Resp: 18 17 18 16   Temp: 99 F (37.2 C) 98.3 F (36.8 C) 97.9 F (36.6 C) 98 F (36.7 C)  TempSrc:      SpO2: 100% 98% 100% 97%  Weight:      Height:       Physical Exam HENT:     Head: Normocephalic.     Mouth/Throat:     Pharynx: No oropharyngeal exudate.  Eyes:     General: Lids are normal.     Conjunctiva/sclera: Conjunctivae normal.  Cardiovascular:     Rate and Rhythm: Normal rate and regular rhythm.     Heart sounds: Normal heart sounds, S1 normal and S2 normal.  Pulmonary:     Breath sounds: No decreased breath sounds,  wheezing, rhonchi or rales.  Abdominal:     Palpations: Abdomen is soft.     Tenderness: There is no abdominal tenderness.  Musculoskeletal:     Right lower leg: Swelling present.     Left lower leg: Swelling present.  Skin:    General: Skin is warm.     Comments: Skin on back is healed.   Neurological:     Mental Status: He is alert and oriented to person, place, and time.     Data Reviewed: AST 2524, ALT 923, total bilirubin 7.0, INR 1.2, CRP 0.7  Disposition: Status is: Inpatient Remains inpatient appropriate because: He will remain here until tomorrow for IV antibiotics.  Case discussed with gastroenterology recommends staying here in the hospital to watch his liver function test stabilized. Planned Discharge Destination: To the street    Time spent: 28 minutes  Author: , MD 04/25/2022 1:16 PM  For on call review [email or web address].

## 2022-04-26 ENCOUNTER — Other Ambulatory Visit: Payer: Self-pay

## 2022-04-26 DIAGNOSIS — M4644 Discitis, unspecified, thoracic region: Secondary | ICD-10-CM | POA: Diagnosis not present

## 2022-04-26 DIAGNOSIS — R7401 Elevation of levels of liver transaminase levels: Secondary | ICD-10-CM | POA: Diagnosis not present

## 2022-04-26 DIAGNOSIS — R768 Other specified abnormal immunological findings in serum: Secondary | ICD-10-CM | POA: Diagnosis not present

## 2022-04-26 DIAGNOSIS — M4620 Osteomyelitis of vertebra, site unspecified: Secondary | ICD-10-CM | POA: Diagnosis not present

## 2022-04-26 MED ORDER — CLOTRIMAZOLE 1 % EX CREA
1.0000 | TOPICAL_CREAM | Freq: Two times a day (BID) | CUTANEOUS | 0 refills | Status: AC
  Filled 2022-04-26: qty 28, 14d supply, fill #0

## 2022-04-26 MED ORDER — BUPRENORPHINE HCL-NALOXONE HCL 8-2 MG SL SUBL
1.0000 | SUBLINGUAL_TABLET | Freq: Two times a day (BID) | SUBLINGUAL | 0 refills | Status: AC
  Filled 2022-04-26: qty 10, 5d supply, fill #0

## 2022-04-26 MED ORDER — POLYVINYL ALCOHOL 1.4 % OP SOLN
1.0000 [drp] | OPHTHALMIC | 0 refills | Status: AC | PRN
  Filled 2022-04-26: qty 15, fill #0

## 2022-04-26 MED ORDER — POLYETHYLENE GLYCOL 3350 17 G PO PACK
30.0000 g | PACK | Freq: Every day | ORAL | 0 refills | Status: AC
  Filled 2022-04-26 (×2): qty 30, 17d supply, fill #0

## 2022-04-26 MED ORDER — CEPHALEXIN 500 MG PO CAPS
500.0000 mg | ORAL_CAPSULE | Freq: Four times a day (QID) | ORAL | 0 refills | Status: AC
  Filled 2022-04-26: qty 40, 10d supply, fill #0

## 2022-04-26 NOTE — Progress Notes (Signed)
Patient being discharged home. IV removed. Patient was given medications from the pharmacy. The Suboxone and Trazodone was called into [facility], patient states he will pick it up. Patient said his aunt is going to pick him up. He refused wheelchair to be taken out. Patient states that his aunt will be here between 12pm-3pm. He said his aunt does not have a phone so she cant call when she gets her that he has to wait outside for her and refused to wait in room.

## 2022-04-26 NOTE — Progress Notes (Signed)
Mobility Specialist - Progress Note    04/26/22 1100  Mobility  Activity Ambulated independently in hallway  Level of Assistance Independent  Assistive Device None  Distance Ambulated (ft) 600 ft  Activity Response Tolerated well  $Mobility charge 1 Mobility   Clarisa Schools Mobility Specialist 04/26/22, 11:40 AM

## 2022-04-26 NOTE — Discharge Summary (Signed)
Physician Discharge Summary   Patient: Derek Dunn MRN: [id number] DOB: Apr 19, 1971  Admit date:     03/15/2022  Discharge date: 04/26/22  Discharge Physician: Sharen Hones   PCP: Pcp, No   Recommendations at discharge:   Follow-up with PCP in 1 week. Follow-up with ID, neurosurgery and GI as scheduled.  Discharge Diagnoses: Principal Problem:   Transaminitis Active Problems:   Acute metabolic encephalopathy   Epidural abscess   Vertebral osteomyelitis (HCC)   Discitis thoracic region   Septic arthritis (HCC)   Fever   Back pain   Right inguinal hernia   Acute hepatitis B   Hypotension   Insomnia   Hepatitis C antibody test positive   Umbilical hernia without obstruction and without gangrene   Left inguinal hernia   Anemia of chronic disease   Stage II decubitus ulcer ([place])  Resolved Problems:   * No resolved hospital problems. [facility] Course: Derek Dunn is a 51 year old male with PMH polysubstance abuse including IV drug use in the past, housing insecurity (incarcerated past 2 months, just released (as of 7/12), hospital admission in May 2023 with MSSA bacteremia secondary to spinal osteomyelitis and epidural abscess, recurrent right inguinal hernia, history of signing out AMA.  He presented to the ED from [facility] on 03/15/2022 with complaints of worsening back pain and recurrent right groin pain.  No reported fevers.  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  Labs were mostly unremarkable including no leukocytosis and normal lactic acid.  Sepsis ruled out.  Patient was started on broad-spectrum IV antibiotic coverage and admitted for further evaluation and management.  Interventional radiology was consulted for abscess aspiration for cultures, performed on 7/12. Infectious disease was also consulted.   Neurosurgery  consulted for consideration of washout in the OR, given poor likelihood of full resolution with antibiotics alone. He was taken to the OR for washout on 03/24/22.  Underwent posterior laminectomy T8-9 for evacuation of epidural abscess, posterolateral arthrodesis T7 to T12 with T7-12 posterior instrumentation.   The patient will remain in the hospital to 04/26/2022 for IV antibiotics.  Patient converted to Suboxone on 04/12/2022.  Patient had some altered mental status on 04/19/2022 to 04/20/2022.  Reviewed in nursing notes.  Urine toxicology positive for amphetamines and cannabis.  As of 04/24/2022 liver function test still rising.  Gastroenterology recommends IV fluids to see if this will help.  I discontinued gabapentin.  8/23.  Patient is adamant that he will leave by 12 noon today, GI has advised patient to stay for further monitoring, patient refused.  At this point, patient is discharged with follow-ups.  Assessment and Plan: * Transaminitis This is secondary to have acute hepatitis B infection.  Patient has been followed by GI, recommended further monitor in the hospital, patient refused to stay.  AST/ALT still going up, but INR still stable.  Patient be followed up with GI as scheduled in 1 to 2 weeks.   Acute metabolic encephalopathy Polydrug abuse. Mental status at baseline at this point.  CT scan of the head negative.  Urine toxicology on 04/20/2022 positive for amphetamines and cannabis.  Patient mental status since has improved since admission.  Epidural abscess Acute osteomyelitis discitis and facet joint septic arthritis at T8-9 with epidural abscess. Severe spinal stenosis at T8-T9. Acute septic arthritis at the left L5-S1 facet. History of IV drug use. Prior history  MSSA bacteremia. --Initially on IV Cefepime, Vancomycin -- IR consulted, performed CT-guided aspiration for culture on 7/12 --Infectious disease consulted  --Was on nafcillin continuous IV infusion --Antibiotic  changed to cefazolin on 7/24 due to rising LFTs --Neurosurgery consulted.  Patient underwent T7-12 posterior instrumentation with decompression and washout of epidural abscess at T8-9 (performed on 03/24/22) -- Drain removed on 03/27/2022  -- Wound VAC removed 7/26 -- Follow-up with neurosurgery in 2 weeks   7/28: per Neurosurgery, pt no longer needs dressing over incision.  May shower and pat incision area dry.     Septic arthritis (HCC) Signs of septic arthritis at level of L5-S1 seen on MRI lumbar spine.  Management as outlined, see epidural abscess.   Discitis thoracic region See epidural abscess for plan   Vertebral osteomyelitis [facility]) Management as outlined   Back pain Patient converted to Suboxone on 04/12/2022.  Will need an outpatient Suboxone clinic upon disposition.  This was discussed with TOC.   Fever Temp 100.4 noted on evening of 7/26. Secondary to acute hepatitis B infection   Acute hepatitis B Acute hepatitis B infection with viral DNA greater than 1 billion.  Need to check antibodies as outpatient to see if he clears this infection.  Follow-up with GI as outpatient.   Right inguinal hernia -repeat CT abd/pelvis on the 28th showed a persistent large right inguinal hernia with no evidence of bowel obstruction.  Patient will be followed by general surgery as outpatient.    Hypotension BP as low as 87/61 on AM of 7/13, suspect due to pain medications.  Otherwise no signs of sepsis.  Condition since has improved.     Insomnia Trazodone at bedtime ordered.   Stage II decubitus ulcer (HCC) Right buttock.  This has healed.   Anemia of chronic disease Last hemoglobin 10.5.  Ferritin elevated above 300.   Hepatitis C antibody test positive HCV Ab reactive this admission.  Hepatitis C RNA not detected.  Likely has cleared this infection.               Consultants: GI, ID, neurosurgery, general surgery. Procedures performed: As above  Disposition: Home Diet  recommendation:  Discharge Diet Orders (From admission, onward)     Start     Ordered   04/26/22 0000  Diet - low sodium heart healthy        04/26/22 1014           Cardiac diet DISCHARGE MEDICATION: Allergies as of 04/26/2022   No Known Allergies      Medication List     TAKE these medications    buprenorphine-naloxone 8-2 mg Subl SL tablet Commonly known as: SUBOXONE Place 1 tablet under the tongue every 12 (twelve) hours for 7 days.   cephALEXin 500 MG capsule Commonly known as: KEFLEX Take 1 capsule (500 mg total) by mouth 4 (four) times daily for 14 days.   clotrimazole 1 % cream Commonly known as: LOTRIMIN Apply 1 Application topically 2 (two) times daily.   polyethylene glycol 17 g packet Commonly known as: MIRALAX / GLYCOLAX Take 30 g by mouth daily.   polyvinyl alcohol 1.4 % ophthalmic solution Commonly known as: LIQUIFILM TEARS Place 1 drop into both eyes as needed for dry eyes.   traZODone 50 MG tablet Commonly known as: DESYREL Take 1 tablet (50 mg total) by mouth at bedtime as needed for sleep.               Discharge Care Instructions  (From  admission, onward)           Start     Ordered   04/26/22 0000  Discharge wound care:       Comments: Follow with pcp   04/26/22 1014            Follow-up Information     Venetia Night, MD Follow up on 05/04/2022.   Specialty: Neurosurgery Why: for thoracic xrays and routine post-op follow up (05/04/22) Contact information: [address] [place] [address] [phone]         Henrene Dodge, MD Follow up in 2 week(s).   Specialty: General Surgery Why: Can follow up 2 weeks after discharge. Contact information: [address] [place] [address] [phone]         Regis Bill, MD Follow up in 2 week(s).   Specialty: Gastroenterology Contact information: [address] [place] [address] [phone]          Lynn Ito, MD Follow up in 3 week(s).   Specialty: Infectious Diseases Contact information: [address] [place] [address] [phone]                Discharge Exam: Ceasar Mons Weights   03/15/22 0500 03/24/22 1316  Weight: 88.5 kg 88.5 kg   General exam: Appears calm and comfortable  Respiratory system: Clear to auscultation. Respiratory effort normal. Cardiovascular system: S1 & S2 heard, RRR. No JVD, murmurs, rubs, gallops or clicks. No pedal edema. Gastrointestinal system: Abdomen is nondistended, soft and nontender. No organomegaly or masses felt. Normal bowel sounds heard. Central nervous system: Alert and oriented. No focal neurological deficits. Extremities: Symmetric 5 x 5 power. Skin: No rashes, lesions or ulcers Psychiatry: Judgement and insight appear normal. Mood & affect appropriate.    Condition at discharge: good  The results of significant diagnostics from this hospitalization (including imaging, microbiology, ancillary and laboratory) are listed below for reference.   Imaging Studies: CT HEAD WO CONTRAST ( )  Result Date: 04/20/2022 CLINICAL DATA:  Mental status change, unknown cause EXAM: CT HEAD WITHOUT CONTRAST TECHNIQUE: Contiguous axial images were obtained from the base of the skull through the vertex without intravenous contrast. RADIATION DOSE REDUCTION: This exam was performed according to the departmental dose-optimization program which includes automated exposure control, adjustment of the mA and/or kV according to patient size and/or use of iterative reconstruction technique. COMPARISON:  August 2022 FINDINGS: Brain: There is no acute intracranial hemorrhage, mass effect, or edema. Gray-white differentiation is preserved. There is no extra-axial fluid collection. Ventricles and sulci are within normal limits in size and configuration. Vascular: No hyperdense vessel or unexpected calcification. Skull: Calvarium is  unremarkable. Sinuses/Orbits: No acute finding. Other: None. IMPRESSION: No acute intracranial abnormality. Electronically Signed   By: Guadlupe Spanish M.D.   On: 04/20/2022 11:19   CT ABDOMEN PELVIS WO CONTRAST  Result Date: 03/31/2022 CLINICAL DATA:  Complicated inguinal hernia. Right groin pain is worsening. New left scrotal pain. EXAM: CT ABDOMEN AND PELVIS WITHOUT CONTRAST TECHNIQUE: Multidetector CT imaging of the abdomen and pelvis was performed following the standard protocol without IV contrast. RADIATION DOSE REDUCTION: This exam was performed according to the departmental dose-optimization program which includes automated exposure control, adjustment of the mA and/or kV according to patient size and/or use of iterative reconstruction technique. COMPARISON:  CT abdomen and pelvis 03/15/2022 FINDINGS: Lower chest: Increased volume loss at both lower lobes with some consolidation and bilateral air bronchograms in the lower lobes. Small area  of consolidation with air bronchogram in the lingula. No pleural effusions. Hepatobiliary: Tiny calcification in the gallbladder could represent a small stone or wall calcification. There is no gallbladder distension. No surrounding inflammatory changes. Normal appearance of the liver. Pancreas: Unremarkable. No pancreatic ductal dilatation or surrounding inflammatory changes. Spleen: Normal in size without focal abnormality. Adrenals/Urinary Tract: Normal adrenal glands. Two small calculi in the right kidney could represent small nonobstructive stones. Two small calcifications in the left kidney lower pole are compatible with stones largest measures 3 mm. No hydronephrosis. Small amount of fluid in the urinary bladder. Mild urinary bladder wall thickening is nonspecific and could be related to under distension. Stomach/Bowel: Normal appearance of the stomach. The small bowel dilatation has resolved since the previous abdominal CT. There is oral contrast within the  small bowel and right colon. Again noted is a large right inguinal hernia containing loops of small bowel that contain oral contrast. Fluid in the right inguinal hernia sac has resolved. No evidence for bowel obstruction or focal bowel inflammation. Vascular/Lymphatic: Aortic atherosclerosis. No enlarged abdominal or pelvic lymph nodes. Reproductive: Prostate is unremarkable. Other: Large right inguinal hernia containing small bowel. Small left inguinal hernia containing fat. Small umbilical and periumbilical hernia containing fat. The pelvic free fluid and mesenteric edema have resolved since the previous CT. Negative for free air. Diffuse subcutaneous edema. Musculoskeletal: Bilateral pedicle screw and rod fixation in the lower thoracic spine. There are bilateral pedicle screws at T8, T10, T11 and T12. No pedicle screws at T9. Again noted is marked irregularity around the T9 vertebral body with paravertebral soft tissue thickening and disc space/vertebral body destruction at T8-T9. This findings are compatible with known osteomyelitis and discitis. The paravertebral soft tissue thickening and inflammatory changes at T9 have decreased since 03/15/2022 and compatible with interval surgery. There is a right-sided laminectomy defect at T9 with a small amount of gas and compatible with recent postoperative changes. IMPRESSION: 1. Persistent large right inguinal hernia containing small bowel. However, there is no evidence for a bowel obstruction and no acute bowel inflammation at this time. 2. Interval surgical fixation in the lower thoracic spine with decreased inflammatory changes around the T8-T9 discitis/osteomyelitis. 3. Volume loss with air bronchograms at both lung bases. Findings could be associated with atelectasis. Difficult to exclude infection at the lung bases. 4. Nonobstructive bilateral renal calculi. 5.  Aortic Atherosclerosis (ICD10-I70.0). 6. Small left inguinal hernia containing fat. Small ventral  hernias containing fat. 7. Diffuse subcutaneous edema. Electronically Signed   By: Markus Daft M.D.   On: 03/31/2022 16:23    Microbiology: Results for orders placed or performed during the hospital encounter of 03/15/22  Culture, blood (routine x 2)     Status: None   Collection Time: 03/15/22  3:50 AM   Specimen: BLOOD  Result Value Ref Range Status   Specimen Description BLOOD RIGTH [facility]  Final   Special Requests   Final    BOTTLES DRAWN AEROBIC AND ANAEROBIC Blood Culture adequate volume   Culture   Final    NO GROWTH 5 DAYS Performed at [facility], [address] [place], [place] [address]    Report Status 03/20/2022 FINAL  Final  Culture, blood (routine x 2)     Status: None   Collection Time: 03/15/22  4:00 AM   Specimen: BLOOD  Result Value Ref Range Status   Specimen Description BLOOD RIGHT ANTECUBITAL  Final   Special Requests   Final    BOTTLES DRAWN AEROBIC  AND ANAEROBIC Blood Culture adequate volume   Culture   Final    NO GROWTH 5 DAYS Performed at [facility], [address] [place] [place], [place] [address]    Report Status 03/20/2022 FINAL  Final  Aerobic/Anaerobic Culture w Gram Stain (surgical/deep wound)     Status: None   Collection Time: 03/15/22  9:31 AM   Specimen: Abscess  Result Value Ref Range Status   Specimen Description   Final    ABSCESS Performed at [facility], [address] [place], [place] [address]    Special Requests   Final    NONE Performed at [facility], [address] [place], [place] [address]    Gram Stain   Final    ABUNDANT WBC PRESENT, PREDOMINANTLY PMN RARE GRAM POSITIVE COCCI IN PAIRS    Culture   Final    MODERATE STAPHYLOCOCCUS AUREUS NO ANAEROBES ISOLATED Performed at [facility], [address], [place], [place] [address]    Report Status 03/20/2022 FINAL  Final   Organism ID, Bacteria STAPHYLOCOCCUS AUREUS  Final      Susceptibility   Staphylococcus aureus - MIC*     CIPROFLOXACIN <=0.5 SENSITIVE Sensitive     ERYTHROMYCIN <=0.25 SENSITIVE Sensitive     GENTAMICIN <=0.5 SENSITIVE Sensitive     OXACILLIN 0.5 SENSITIVE Sensitive     TETRACYCLINE <=1 SENSITIVE Sensitive     VANCOMYCIN 1 SENSITIVE Sensitive     TRIMETH/SULFA <=10 SENSITIVE Sensitive     CLINDAMYCIN <=0.25 SENSITIVE Sensitive     RIFAMPIN <=0.5 SENSITIVE Sensitive     Inducible Clindamycin NEGATIVE Sensitive     * MODERATE STAPHYLOCOCCUS AUREUS  Surgical PCR screen     Status: Abnormal   Collection Time: 03/16/22  6:34 AM   Specimen: Abscess; Nasal Swab  Result Value Ref Range Status   MRSA, PCR NEGATIVE NEGATIVE Final   Staphylococcus aureus POSITIVE (A) NEGATIVE Final    Comment: (NOTE) The Xpert SA Assay (FDA approved for NASAL specimens in patients 6 years of age and older), is one component of a comprehensive surveillance program. It is not intended to diagnose infection nor to guide or monitor treatment. Performed at [facility], [address] [place], [place] [address]   Resp Panel by RT-PCR (Flu A&B, Covid) Anterior Nasal Swab     Status: None   Collection Time: 03/30/22 11:50 AM   Specimen: Anterior Nasal Swab  Result Value Ref Range Status   SARS Coronavirus 2 by RT PCR NEGATIVE NEGATIVE Final    Comment: (NOTE) SARS-CoV-2 target nucleic acids are NOT DETECTED.  The SARS-CoV-2 RNA is generally detectable in upper respiratory specimens during the acute phase of infection. The lowest concentration of SARS-CoV-2 viral copies this assay can detect is 138 copies/mL. A negative result does not preclude SARS-Cov-2 infection and should not be used as the sole basis for treatment or other patient management decisions. A negative result may occur with  improper specimen collection/handling, submission of specimen other than nasopharyngeal swab, presence of viral mutation(s) within the areas targeted by this assay, and inadequate number of viral copies(<138  copies/mL). A negative result must be combined with clinical observations, patient history, and epidemiological information. The expected result is Negative.  Fact Sheet for Patients:  BloggerCourse.com  Fact Sheet for Healthcare Providers:  SeriousBroker.it  This test is no t yet approved or cleared by the [place] FDA and  has been authorized for detection and/or diagnosis of SARS-CoV-2 by  FDA under an Emergency Use Authorization (EUA). This EUA will remain  in effect (meaning this test can be used) for the duration of the COVID-19 declaration under Section 564(b)(1) of the Act, 21 U.S.C.section 360bbb-3(b)(1), unless the authorization is terminated  or revoked sooner.       Influenza A by PCR NEGATIVE NEGATIVE Final   Influenza B by PCR NEGATIVE NEGATIVE Final    Comment: (NOTE) The Xpert Xpress SARS-CoV-2/FLU/RSV plus assay is intended as an aid in the diagnosis of influenza from Nasopharyngeal swab specimens and should not be used as a sole basis for treatment. Nasal washings and aspirates are unacceptable for Xpert Xpress SARS-CoV-2/FLU/RSV testing.  Fact Sheet for Patients: EntrepreneurPulse.com.au  Fact Sheet for Healthcare Providers: IncredibleEmployment.be  This test is not yet approved or cleared by the [place] FDA and has been authorized for detection and/or diagnosis of SARS-CoV-2 by FDA under an Emergency Use Authorization (EUA). This EUA will remain in effect (meaning this test can be used) for the duration of the COVID-19 declaration under Section 564(b)(1) of the Act, 21 U.S.C. section 360bbb-3(b)(1), unless the authorization is terminated or revoked.  Performed at [facility], [address] [place], [place] [address]   Expectorated Sputum Assessment w Gram Stain, Rflx to Resp Cult     Status: None   Collection Time: 03/31/22  2:23 PM   Specimen:  Sputum  Result Value Ref Range Status   Specimen Description SPUTUM  Final   Special Requests NONE  Final   Sputum evaluation   Final    THIS SPECIMEN IS ACCEPTABLE FOR SPUTUM CULTURE Performed at [facility], [address], [place], [place] [address]    Report Status 03/31/2022 FINAL  Final  Culture, Respiratory w Gram Stain     Status: None   Collection Time: 03/31/22  2:23 PM   Specimen: SPU  Result Value Ref Range Status   Specimen Description   Final    SPUTUM Performed at [facility], [address] [place], [place] [address]    Special Requests   Final    NONE Reflexed from [id number] Performed at [facility], [address] [place], [place] [address]    Gram Stain   Final    NO SQUAMOUS EPITHELIAL CELLS SEEN FEW WBC SEEN ABUNDANT GRAM NEGATIVE RODS Performed at [facility], [place] [address], [place], [place] [address]    Culture   Final    ABUNDANT SERRATIA LIQUEFACIENS MODERATE KLEBSIELLA PNEUMONIAE    Report Status 04/04/2022 FINAL  Final   Organism ID, Bacteria SERRATIA LIQUEFACIENS  Final   Organism ID, Bacteria KLEBSIELLA PNEUMONIAE  Final      Susceptibility   Klebsiella pneumoniae - MIC*    AMPICILLIN RESISTANT Resistant     CEFAZOLIN <=4 SENSITIVE Sensitive     CEFEPIME <=0.12 SENSITIVE Sensitive     CEFTAZIDIME <=1 SENSITIVE Sensitive     CEFTRIAXONE <=0.25 SENSITIVE Sensitive     CIPROFLOXACIN <=0.25 SENSITIVE Sensitive     GENTAMICIN <=1 SENSITIVE Sensitive     IMIPENEM <=0.25 SENSITIVE Sensitive     TRIMETH/SULFA <=20 SENSITIVE Sensitive     AMPICILLIN/SULBACTAM 4 SENSITIVE Sensitive     PIP/TAZO <=4 SENSITIVE Sensitive     * MODERATE KLEBSIELLA PNEUMONIAE   Serratia liquefaciens - MIC*    CEFAZOLIN >=64 RESISTANT Resistant     CEFEPIME <=0.12 SENSITIVE Sensitive     CEFTAZIDIME <=1 SENSITIVE Sensitive     CEFTRIAXONE <=0.25 SENSITIVE Sensitive  CIPROFLOXACIN <=0.25 SENSITIVE Sensitive     GENTAMICIN <=1  SENSITIVE Sensitive     IMIPENEM 1 SENSITIVE Sensitive     TRIMETH/SULFA <=20 SENSITIVE Sensitive     PIP/TAZO <=4 SENSITIVE Sensitive     * ABUNDANT SERRATIA LIQUEFACIENS  Culture, blood (Routine X 2) w Reflex to ID Panel     Status: None   Collection Time: 03/31/22  5:37 PM   Specimen: BLOOD  Result Value Ref Range Status   Specimen Description BLOOD RIGHT ANTECUBITAL  Final   Special Requests   Final    BOTTLES DRAWN AEROBIC AND ANAEROBIC Blood Culture adequate volume   Culture   Final    NO GROWTH 5 DAYS Performed at [facility], [address] [place], [place] [address]    Report Status 04/05/2022 FINAL  Final  Culture, blood (Routine X 2) w Reflex to ID Panel     Status: None   Collection Time: 03/31/22  5:44 PM   Specimen: BLOOD  Result Value Ref Range Status   Specimen Description BLOOD LEFT ANTECUBITAL  Final   Special Requests   Final    BOTTLES DRAWN AEROBIC AND ANAEROBIC Blood Culture adequate volume   Culture   Final    NO GROWTH 5 DAYS Performed at [facility], [address] [place], [place] [address]    Report Status 04/05/2022 FINAL  Final    Labs: CBC: Recent Labs  Lab 04/21/22 0619 04/24/22 0637  WBC 2.4*  --   HGB 10.5* 11.6*  HCT 34.6*  --   MCV 88.5  --   PLT 185  --    Basic Metabolic Panel: Recent Labs  Lab 04/20/22 0544 04/21/22 0619 04/22/22 0657 04/24/22 0637 04/25/22 0630  NA 136 139 137 139 139  K 4.6 4.1 4.0 4.3 4.1  CL 102 110 109 107 108  CO2 28 25 25 25 26   GLUCOSE 120* 106* 118* 112* 113*  BUN 16 12 13 15 12   CREATININE 0.83 0.68 0.76 0.74 0.78  CALCIUM 9.4 9.1 8.9 9.4 9.3   Liver Function Tests: Recent Labs  Lab 04/20/22 0544 04/21/22 0619 04/22/22 0657 04/24/22 0637 04/25/22 0630  AST 1,371* 1,451* 1,523* 2,299* 2,524*  ALT 544* 542* 592* 800* 923*  ALKPHOS 210* 170* 159* 169* 158*  BILITOT 4.0* 3.7* 4.1* 6.1* 7.0*  PROT 8.2* 6.8 6.7 7.1 7.1  ALBUMIN 3.6 2.9* 2.8* 2.9* 2.9*    CBG: No results for input(s): "GLUCAP" in the last 168 hours.  Discharge time spent: greater than 30 minutes.  Signed: 04/26/22, MD [facility] 04/26/2022

## 2022-04-28 LAB — MISC LABCORP TEST (SEND OUT): Labcorp test code: 820201

## 2022-05-03 ENCOUNTER — Other Ambulatory Visit: Payer: Self-pay

## 2022-05-03 DIAGNOSIS — Z981 Arthrodesis status: Secondary | ICD-10-CM

## 2022-05-04 ENCOUNTER — Ambulatory Visit
Admission: RE | Admit: 2022-05-04 | Discharge: 2022-05-04 | Disposition: A | Discharge status: Home / Self Care | Attending: Neurosurgery | Admitting: Neurosurgery

## 2022-05-04 ENCOUNTER — Encounter: Payer: Self-pay | Admitting: Neurosurgery

## 2022-05-04 ENCOUNTER — Ambulatory Visit
Admission: RE | Admit: 2022-05-04 | Discharge: 2022-05-04 | Disposition: A | Discharge status: Home / Self Care | Source: Ambulatory Visit | Attending: Neurosurgery | Admitting: Neurosurgery

## 2022-05-04 ENCOUNTER — Ambulatory Visit (INDEPENDENT_AMBULATORY_CARE_PROVIDER_SITE_OTHER): Payer: Self-pay | Admitting: Orthopedic Surgery

## 2022-05-04 VITALS — BP 106/66 | Ht 76.0 in | Wt 195.0 lb

## 2022-05-04 DIAGNOSIS — Z981 Arthrodesis status: Secondary | ICD-10-CM | POA: Diagnosis not present

## 2022-05-04 DIAGNOSIS — Z09 Encounter for follow-up examination after completed treatment for conditions other than malignant neoplasm: Secondary | ICD-10-CM

## 2022-05-04 DIAGNOSIS — M4644 Discitis, unspecified, thoracic region: Secondary | ICD-10-CM

## 2022-05-04 NOTE — Progress Notes (Signed)
   REFERRING PHYSICIAN:  Venetia Night, Md [address] [place],  [place] [address]  DOS: 03/24/22 T7-12 posterior instrumentation with decompression and washout of epidural abscess at T8-9. Known history of IV drug use.   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  He is post-op from T7-12 posterior instrumentation with decompression and washout of epidural abscess at T8-9 by Dr. Myer Haff on 03/24/22.   He completed a full course of IV antibiotics as an inpatient (nafcillin that was changed to cefazolin due to elevated LFTs). Was discharged on keflex x 2 weeks and he is almost done with it.   He has minimal complaints of intermittent mid back pain. No pain that radiates to his chest/abdomen. No arm or leg pain. No numbness or tingling. No bowel or bladder issues.   PHYSICAL EXAMINATION:  General: Patient is well developed, well nourished, calm, collected, and in no apparent distress.   NEUROLOGICAL:  General: In no acute distress.   Awake, alert, oriented to person, place, and time.  Pupils equal round and reactive to light.  Facial tone is symmetric.    Strength:  Side Biceps Triceps Deltoid Interossei Grip Wrist Ext. Wrist Flex.  R 5 5 5 5 5 5 5   L 5 5 5 5 5 5 5            Side Iliopsoas Quads Hamstring PF DF EHL  R 5 5 5 5 5 5   L 5 5 5 5 5 5    Incision c/d/I, some scabbing at distal portion. No signs of infection, erythema, or fluctuance.    ROS (Neurologic):  Negative except as noted above  IMAGING: Thoracic xrays 05/04/22:  Instrumented fusion T7-T12 with hardware intact. Xrays reviewed with Dr. .   ASSESSMENT/PLAN:  Derek Dunn is doing well approximately 6 weeks after T7-12 posterior instrumentation with decompression and washout of epidural abscess at T8-9. He has minimal intermittent thoracic back pain. No arm/leg pain. No balance issues.   Treatment options  reviewed with patient and following plan made:   - No lifting over 25 pounds. Care with bending/twisting. - Finish out keflex and follow up with ID.  - Follow up with Dr. as scheduled in 6 weeks.   Advised to contact the office if any questions or concerns arise.  05/06/22 PA-C Department of neurosurgery

## 2022-06-06 ENCOUNTER — Telehealth: Payer: Self-pay

## 2022-06-06 NOTE — Telephone Encounter (Signed)
Attempted to call patient to see if he would like to be seen by Dr Hampton Abbot for his hernias. His phone number is not working and his contact person has not seen him for a few weeks and says that he is living on the street again.

## 2022-06-08 ENCOUNTER — Telehealth: Payer: Self-pay | Admitting: Surgery

## 2022-06-08 NOTE — Telephone Encounter (Signed)
Another attempt is made.  This time was able to speak with aunt, Derek Dunn who states that they have not heard from him in over 4 weeks since he got out of the hospital.  She says that he is homeless, living on the streets somewhere and have no idea where he is. IF she hears from him will have him to call us.

## 2022-06-12 ENCOUNTER — Other Ambulatory Visit: Payer: Self-pay

## 2022-06-12 DIAGNOSIS — Z981 Arthrodesis status: Secondary | ICD-10-CM

## 2022-06-12 DIAGNOSIS — M4644 Discitis, unspecified, thoracic region: Secondary | ICD-10-CM

## 2022-06-15 ENCOUNTER — Encounter: Payer: Self-pay | Admitting: Neurosurgery

## 2023-02-07 IMAGING — MR MR THORACIC SPINE WO/W CM
7 of 9 series · 30 of 48 positions shown · IV contrast (gadavist)
Comparison: CT abdomen pelvis 01/04/2022

CLINICAL DATA: Back pain with left-sided radiculopathy. History of
IV drug use. MSSA bacteremia. Assess for vertebral
discitis-osteomyelitis

EXAM:
MRI THORACIC AND LUMBAR SPINE WITHOUT AND WITH CONTRAST
TECHNIQUE: Multiplanar and multiecho pulse sequences of the thoracic and lumbar
spine were obtained without and with intravenous contrast.
CONTRAST:  10mL GADAVIST GADOBUTROL 1 MMOL/ML IV SOLN

[Series 18: T1 · sagittal · 6.0mm · 1.88mm/px · 1 of 9 slices shown (1 of 2)]
[im 1/9]
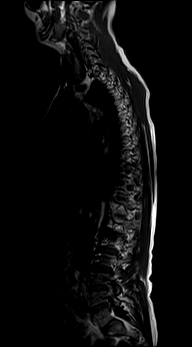

[Series 19: T2 · sagittal · 3.0mm · 1.33mm/px · 3 of 19 slices shown (1 of 2)]
[im 1/19]
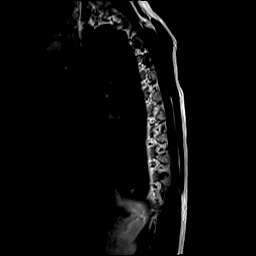
[im 10/19]
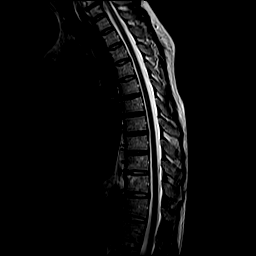
[im 19/19]
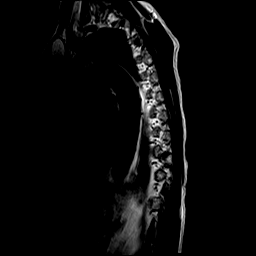

[Series 20: T1 · sagittal · 3.0mm · 1.33mm/px · 4 of 19 slices shown (2 of 2)]
[im 1/19]
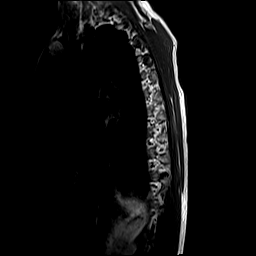
[im 7/19]
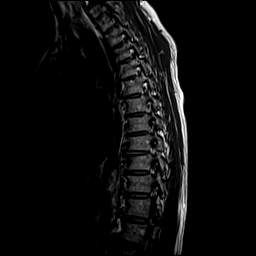
[im 13/19]
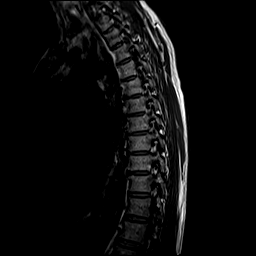
[im 19/19]
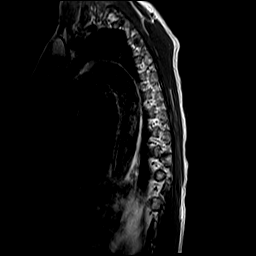

[Series 21: STIR · sagittal · 3.0mm · 0.66mm/px · 4 of 19 slices shown]
[im 1/19]
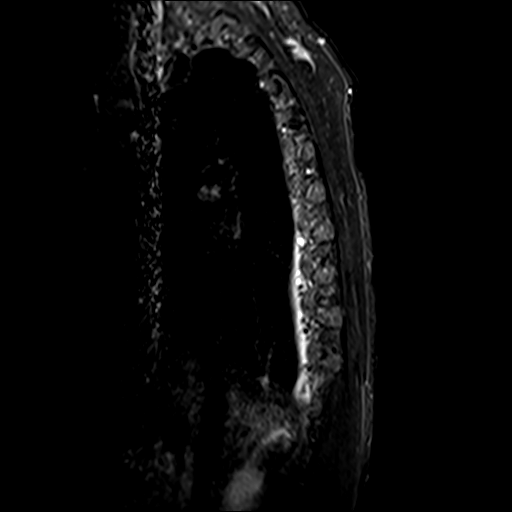
[im 7/19]
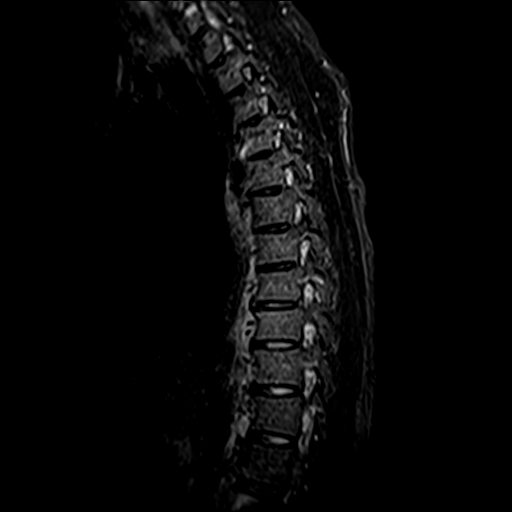
[im 13/19]
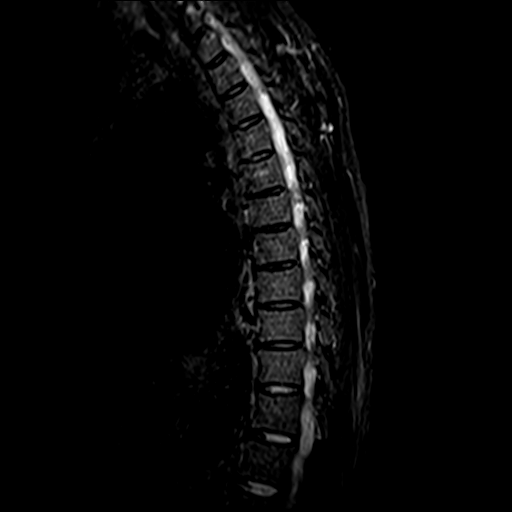
[im 19/19]
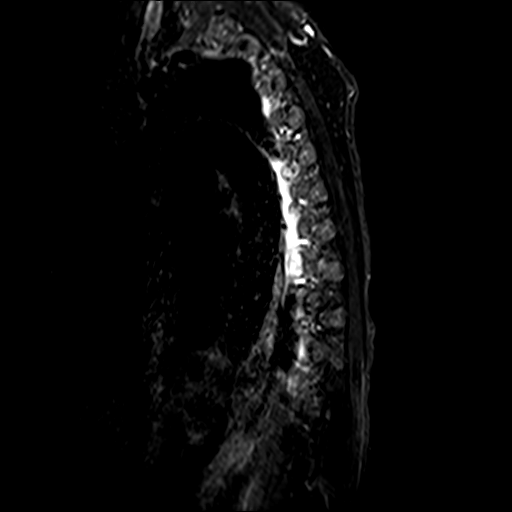

[Series 22: T2 · axial · 4.0mm · 0.59mm/px · z∈[-360,-100]mm · 8 of 39 slices shown (2 of 2)]
[im 1/39]
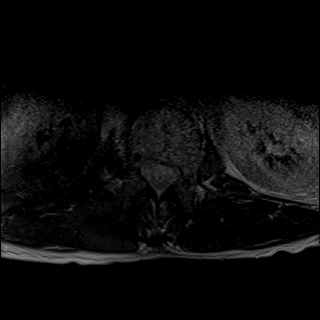
[im 6/39]
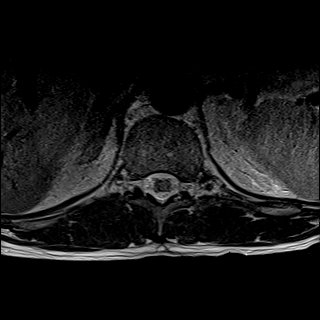
[im 11/39]
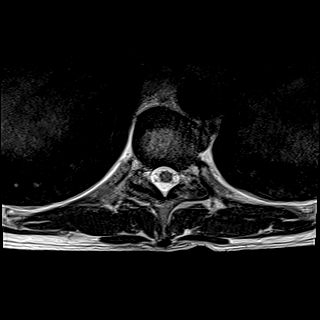
[im 17/39]
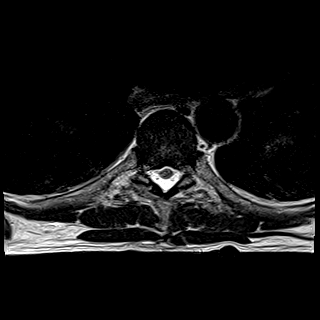
[im 22/39]
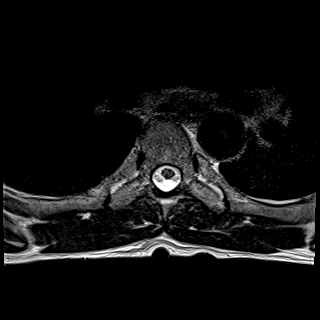
[im 28/39]
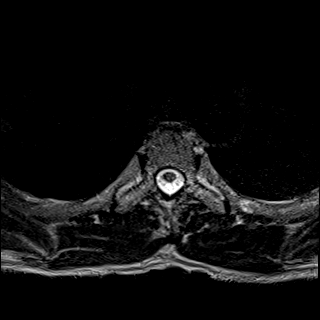
[im 33/39]
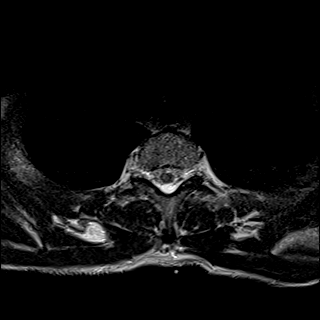
[im 39/39]
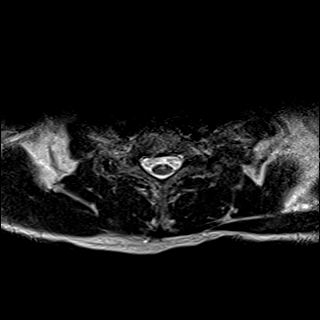

[Series 24: T1 post-contrast · axial · non-contrast · 4.0mm · 0.37mm/px · z∈[-360,-160]mm · 6 of 39 slices shown]
[im 1/39]
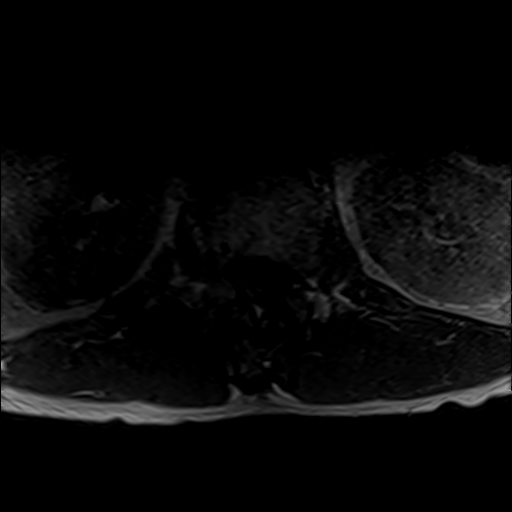
[im 6/39]
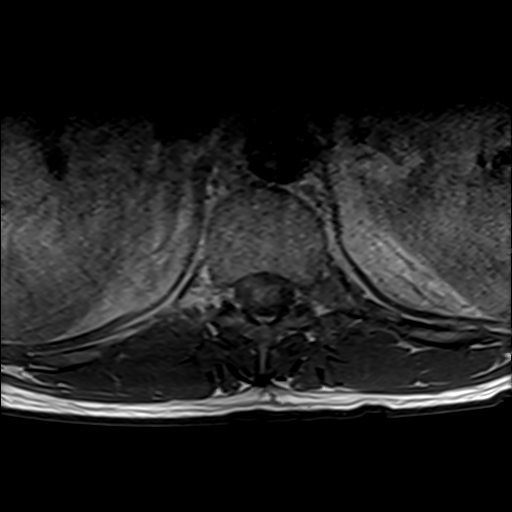
[im 11/39]
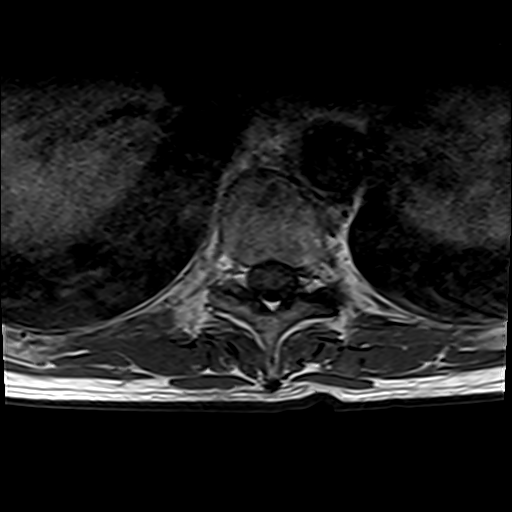
[im 17/39]
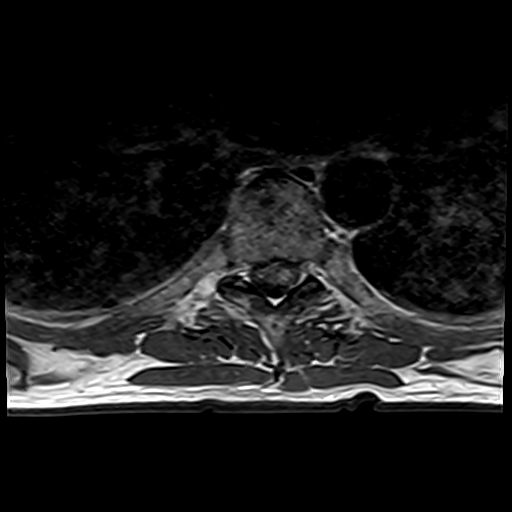
[im 22/39]
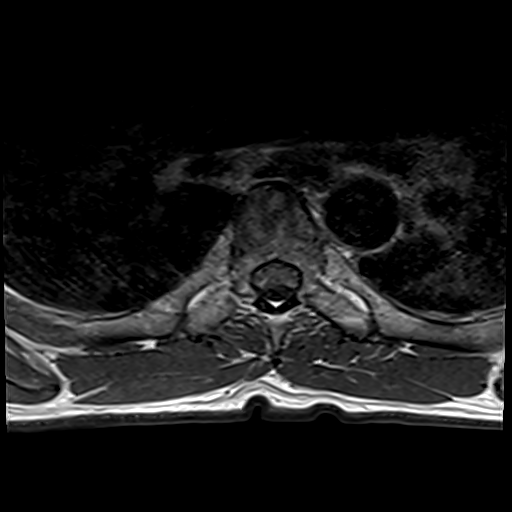
[im 28/39]
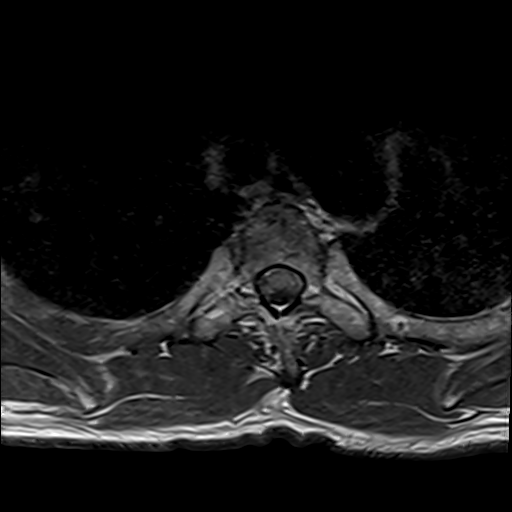

[Series 25: T1 fat-sat post-contrast · sagittal · 3.0mm · 1.33mm/px · 4 of 19 slices shown]
[im 1/19]
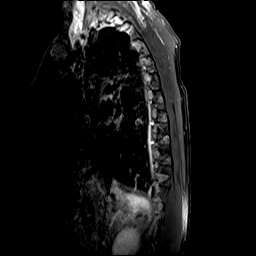
[im 7/19]
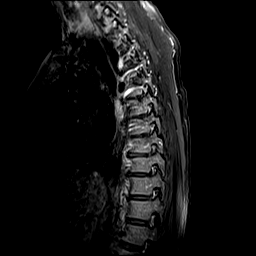
[im 13/19]
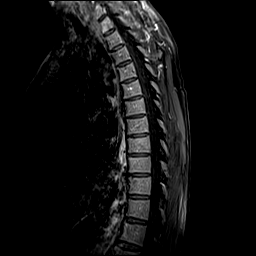
[im 19/19]
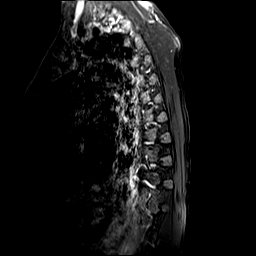

[30 of 48 positions shown; findings below may reference images not displayed]

FINDINGS: MRI THORACIC SPINE FINDINGS

Alignment:  Physiologic.

Vertebrae: No fracture, evidence of discitis, or bone lesion.

Cord:  Normal signal and morphology.  No epidural fluid collection.

Paraspinal and other soft tissues: No paravertebral inflammatory
changes or fluid collections.

Disc levels:

Shallow noncompressive disc protrusions at T6-7, T7-8, and T8-9.
Remaining discs are unremarkable. No foraminal or canal stenosis at
any level.

MRI LUMBAR SPINE FINDINGS

Segmentation:  Standard.

Alignment:  Physiologic.

Vertebrae: Bilateral facet joint effusions at the L4-5 and L5-S1
levels. Rim enhancing fluid collection within the posterior
paraspinal soft tissues contiguous with the left L5-S1 facet joint
measuring 2.0 x 1.6 x 2.0 cm (series 6, image 35). There is also a
rounded 0.6 cm T2 hyperintense structure along the posterior margin
of the right L5-S1 facet joint without rim enhancement, favored to
represent a facet synovial cyst (series 6, image 35). Mild bone
marrow edema adjacent to the bilateral L5-S1, and to a lesser degree
L4-5, facet joints. No obvious erosion.

No evidence of discitis. Minimal discogenic endplate marrow changes
along the superior endplate of L1. No fracture. No suspicious bone
lesion.

Conus medullaris: Extends to the T12-L1 level and appears normal.
Small rim enhancing epidural fluid collection within the sacral
spine at S1 on the left measuring 15 x 5 x 15 mm with mass effect
upon the adjacent S1 nerve root (series 6, images 37-40).
Additionally there is a 17 x 6 x 7 mm abscess within the left L5-S1
neural foramina (series 13, image 35).

Paraspinal and other soft tissues: Paraspinal inflammatory changes
and fluid collections, as above. No retroperitoneal abnormality. No
inflammatory changes within the psoas musculature.

Disc levels:

T12-L1: No significant disc protrusion, foraminal stenosis, or canal
stenosis.

L1-L2: Mild annular disc bulge resulting in mild bilateral foraminal
stenosis. No canal stenosis.

L2-L3: Mild broad-based disc bulge with mild bilateral facet
hypertrophy. Mild bilateral foraminal and subarticular recess
stenosis. No canal stenosis.

L3-L4: Mild disc bulge with posterior annular fissure. Moderate
bilateral facet hypertrophy. Mild-moderate bilateral foraminal
stenosis. No canal stenosis.

L4-L5: Mild disc bulge with posterior annular fissure. Mild
bilateral facet hypertrophy. Mild-moderate left and mild right
foraminal stenosis. No canal stenosis.

L5-S1: No disc bulge. Mild bilateral facet hypertrophy. No canal or
foraminal stenosis.
IMPRESSION: 1. Findings compatible with septic arthritis of the left L5-S1 facet
joint. Small abscess within the posterior paraspinal soft tissues
contiguous with the left L5-S1 facet joint measuring 2.0 x 1.6 x
cm.
2. Small rim-enhancing epidural abscess within the sacral canal at
S1 on the left measuring up to 15 mm with mass effect upon the
adjacent S1 nerve root. Additionally, there is a 17 mm abscess
within the left L5-S1 neural foramina.
3. Small facet effusion on the right at L5-S1. There is also a
cm cystic structure along the posterior margin of the right L5-S1
facet joint without rim enhancement, favored to represent a facet
synovial cyst.
4. Small bilateral L4-5 facet joint effusions. Septic arthritis not
excluded at these sites.
5. No evidence of discitis-osteomyelitis within the thoracic or
lumbar spine.
6. Degenerative lumbar spondylosis with multilevel bilateral
foraminal stenosis. No canal stenosis at any level.

These results will be called to the ordering clinician or
representative by the Radiologist Assistant, and communication
documented in the PACS or [REDACTED].

## 2023-04-26 ENCOUNTER — Other Ambulatory Visit: Payer: Self-pay

## 2023-09-20 ENCOUNTER — Observation Stay
Admission: EM | Admit: 2023-09-20 | Discharge: 2023-09-22 | Disposition: A | Discharge status: Home / Self Care | Payer: Self-pay | Attending: Surgery | Admitting: Surgery

## 2023-09-20 ENCOUNTER — Encounter: Payer: Self-pay | Admitting: Anesthesiology

## 2023-09-20 ENCOUNTER — Other Ambulatory Visit: Payer: Self-pay

## 2023-09-20 ENCOUNTER — Emergency Department: Payer: Self-pay

## 2023-09-20 ENCOUNTER — Encounter: Payer: Self-pay | Admitting: Emergency Medicine

## 2023-09-20 DIAGNOSIS — K4090 Unilateral inguinal hernia, without obstruction or gangrene, not specified as recurrent: Principal | ICD-10-CM

## 2023-09-20 DIAGNOSIS — F17210 Nicotine dependence, cigarettes, uncomplicated: Secondary | ICD-10-CM | POA: Insufficient documentation

## 2023-09-20 DIAGNOSIS — Z8719 Personal history of other diseases of the digestive system: Secondary | ICD-10-CM

## 2023-09-20 LAB — URINALYSIS, ROUTINE W REFLEX MICROSCOPIC
Bilirubin Urine: NEGATIVE
Glucose, UA: NEGATIVE mg/dL
Hgb urine dipstick: NEGATIVE
Ketones, ur: NEGATIVE mg/dL
Nitrite: POSITIVE — AB
Protein, ur: NEGATIVE mg/dL
Specific Gravity, Urine: 1.018 (ref 1.005–1.030)
WBC, UA: >50 WBC/hpf (ref 0–5)
pH: 6 (ref 5.0–8.0)

## 2023-09-20 LAB — CBC
HCT: 37 % — ABNORMAL LOW (ref 39.0–52.0)
Hemoglobin: 11.6 g/dL — ABNORMAL LOW (ref 13.0–17.0)
MCH: 26.9 pg (ref 26.0–34.0)
MCHC: 31.4 g/dL (ref 30.0–36.0)
MCV: 85.8 fL (ref 80.0–100.0)
Platelets: 318 10*3/uL (ref 150–400)
RBC: 4.31 MIL/uL (ref 4.22–5.81)
RDW: 13.3 % (ref 11.5–15.5)
WBC: 5.8 10*3/uL (ref 4.0–10.5)
nRBC: 0 % (ref 0.0–0.2)

## 2023-09-20 LAB — LIPASE, BLOOD: Lipase: 57 U/L — ABNORMAL HIGH (ref 11–51)

## 2023-09-20 LAB — COMPREHENSIVE METABOLIC PANEL
ALT: 12 U/L (ref 0–44)
AST: 19 U/L (ref 15–41)
Albumin: 3.3 g/dL — ABNORMAL LOW (ref 3.5–5.0)
Alkaline Phosphatase: 68 U/L (ref 38–126)
Anion gap: 6 (ref 5–15)
BUN: 22 mg/dL — ABNORMAL HIGH (ref 6–20)
CO2: 27 mmol/L (ref 22–32)
Calcium: 9 mg/dL (ref 8.9–10.3)
Chloride: 106 mmol/L (ref 98–111)
Creatinine, Ser: 0.93 mg/dL (ref 0.61–1.24)
GFR, Estimated: >60 mL/min (ref >60)
Glucose, Bld: 97 mg/dL (ref 70–99)
Potassium: 4.6 mmol/L (ref 3.5–5.1)
Sodium: 139 mmol/L (ref 135–145)
Total Bilirubin: 0.5 mg/dL (ref 0.0–1.2)
Total Protein: 7.1 g/dL (ref 6.5–8.1)

## 2023-09-20 MED ORDER — SODIUM CHLORIDE 0.9 % IV SOLN
1.0000 g | Freq: Once | INTRAVENOUS | Status: AC
  Administered 2023-09-21: 1 g via INTRAVENOUS
  Filled 2023-09-20: qty 10

## 2023-09-20 MED ORDER — DOXYCYCLINE HYCLATE 100 MG PO TABS
100.0000 mg | ORAL_TABLET | Freq: Once | ORAL | Status: AC
  Administered 2023-09-21: 100 mg via ORAL
  Filled 2023-09-20: qty 1

## 2023-09-20 MED ORDER — FENTANYL CITRATE (PF) 100 MCG/2ML IJ SOLN
INTRAMUSCULAR | Status: AC
  Filled 2023-09-20: qty 2

## 2023-09-20 MED ORDER — MORPHINE SULFATE (PF) 4 MG/ML IV SOLN
4.0000 mg | Freq: Once | INTRAVENOUS | Status: AC
  Administered 2023-09-21: 4 mg via INTRAVENOUS
  Filled 2023-09-20: qty 1

## 2023-09-20 MED ORDER — MIDAZOLAM HCL 2 MG/2ML IJ SOLN
INTRAMUSCULAR | Status: AC
  Filled 2023-09-20: qty 2

## 2023-09-20 MED ORDER — PROPOFOL 10 MG/ML IV BOLUS
INTRAVENOUS | Status: AC
Start: 2023-09-20 — End: ?
  Filled 2023-09-20: qty 20

## 2023-09-20 NOTE — ED Provider Notes (Signed)
[facility] Provider Note    Event Date/Time   First MD Initiated Contact with Patient 09/20/23 2146     (approximate)   History   Hernia   HPI  Derek Dunn is a 53 y.o. male presents to the emergency department with right groin pain.  Patient states he has had ongoing problems with a hernia to his right groin that acutely worsened yesterday.  States that he is unable to reduce it but it always is unreducible given the fact that he walks all the time.  Does endorse some burning with urination.  History of STI in the past and he does not believe that it was treated.  Denies any nausea or vomiting.  Was told in the past that he needs his hernia fixed.     Physical Exam   Triage Vital Signs: ED Triage Vitals [09/20/23 1431]  Encounter Vitals Group     BP 127/82     Systolic BP Percentile      Diastolic BP Percentile      Pulse Rate 78     Resp 18     Temp 97.8 F (36.6 C)     Temp Source Oral     SpO2 99 %     Weight 215 lb (97.5 kg)     Height 6\' 4"  (1.93 m)     Head Circumference      Peak Flow      Pain Score 8     Pain Loc      Pain Education      Exclude from Growth Chart     Most recent vital signs: Vitals:   09/20/23 1431 09/20/23 1807  BP: 127/82 134/84  Pulse: 78 69  Resp: 18 20  Temp: 97.8 F (36.6 C) 98.5 F (36.9 C)  SpO2: 99% 97%    Physical Exam Exam conducted with a chaperone present.  Constitutional:      Appearance: He is well-developed.  HENT:     Head: Atraumatic.  Eyes:     Conjunctiva/sclera: Conjunctivae normal.  Cardiovascular:     Rate and Rhythm: Regular rhythm.  Pulmonary:     Effort: No respiratory distress.  Abdominal:     Hernia: A hernia is present. Hernia is present in the right inguinal area.  Genitourinary:    Testes:        Right: Mass and tenderness present.     Epididymis:     Right: Enlarged. Tenderness present.     Comments: Unable to reduce right inguinal  hernia Musculoskeletal:     Cervical back: Normal range of motion.  Skin:    General: Skin is warm.  Neurological:     Mental Status: He is alert. Mental status is at baseline.      IMPRESSION / MDM / ASSESSMENT AND PLAN / ED COURSE  I reviewed the triage vital signs and the nursing notes.  Differential diagnoses and includes incarcerated hernia, hernia, epididymitis, urinary tract infection   RADIOLOGY I independently reviewed imaging, my interpretation of imaging: Ultrasound with no signs of torsion.  Right inguinal hernia with bowel loop   Labs (all labs ordered are listed, but only abnormal results are displayed) Labs interpreted as -    Labs Reviewed  LIPASE, BLOOD - Abnormal; Notable for the following components:      Result Value   Lipase 57 (*)    All other components within normal limits  COMPREHENSIVE METABOLIC PANEL - Abnormal; Notable for the following  components:   BUN 22 (*)    Albumin 3.3 (*)    All other components within normal limits  CBC - Abnormal; Notable for the following components:   Hemoglobin 11.6 (*)    HCT 37.0 (*)    All other components within normal limits  URINALYSIS, ROUTINE W REFLEX MICROSCOPIC - Abnormal; Notable for the following components:   Color, Urine YELLOW (*)    APPearance CLOUDY (*)    Nitrite POSITIVE (*)    Leukocytes,Ua LARGE (*)    Bacteria, UA RARE (*)    All other components within normal limits  URINE CULTURE  CHLAMYDIA/NGC RT PCR ([facility] ONLY)                UA concerning for possible urinary tract infection with positive nitrites and leukocyte esterase.  Urine culture was obtained.  No significant lab work abnormality.  Ultrasound with right inguinal hernia with bowel loops.  Concern for incarcerated hernia, unable to reduce the right inguinal hernia with significant pain.  Ordered IV pain medication.  Given his history of burning with urination and untreated STI we will start the patient on IV Rocephin and  doxycycline.  Urine was sent for culture.  GC and chlamydia added on.  Ordered CT scan abdomen and pelvis with contrast  Consulted and discussed the patient's case with Dr. Claudine Mouton who is on-call for general surgery who will evaluate the patient in the emergency department.  Care transferred to incoming provider.     PROCEDURES:  Critical Care performed: No  Procedures  Patient's presentation is most consistent with acute presentation with potential threat to life or bodily function.   MEDICATIONS ORDERED IN ED: Medications  morphine (PF) 4 MG/ML injection 4 mg (has no administration in time range)  cefTRIAXone (ROCEPHIN) 1 g in sodium chloride 0.9 % 100 mL IVPB (has no administration in time range)  doxycycline (VIBRA-TABS) tablet 100 mg (has no administration in time range)    FINAL CLINICAL IMPRESSION(S) / ED DIAGNOSES   Final diagnoses:  Right inguinal hernia     Rx / DC Orders   ED Discharge Orders     None        Note:  This document was prepared using Dragon voice recognition software and may include unintentional dictation errors.   Corena Herter, MD 09/20/23 2317

## 2023-09-20 NOTE — ED Triage Notes (Signed)
Patient to ED via POV fro hernia on the right side of groin. Ongoing for over 1 year but pain getting worse. NAD noted.

## 2023-09-20 NOTE — ED Notes (Signed)
Pt was given two packs of graham crackers and a cup of ice water

## 2023-09-21 ENCOUNTER — Other Ambulatory Visit: Payer: Self-pay | Admitting: Surgery

## 2023-09-21 ENCOUNTER — Encounter
Admission: EM | Disposition: A | Discharge status: Home / Self Care | Payer: Self-pay | Source: Home / Self Care | Attending: Emergency Medicine

## 2023-09-21 ENCOUNTER — Other Ambulatory Visit: Payer: Self-pay

## 2023-09-21 ENCOUNTER — Ambulatory Visit: Admit: 2023-09-21 | Payer: Self-pay | Admitting: Surgery

## 2023-09-21 ENCOUNTER — Observation Stay: Payer: Self-pay | Admitting: Anesthesiology

## 2023-09-21 ENCOUNTER — Encounter: Payer: Self-pay | Admitting: Surgery

## 2023-09-21 DIAGNOSIS — Z8719 Personal history of other diseases of the digestive system: Secondary | ICD-10-CM

## 2023-09-21 DIAGNOSIS — K4090 Unilateral inguinal hernia, without obstruction or gangrene, not specified as recurrent: Principal | ICD-10-CM

## 2023-09-21 HISTORY — PX: XI ROBOTIC ASSISTED INGUINAL HERNIA REPAIR WITH MESH: SHX6706

## 2023-09-21 LAB — CHLAMYDIA/NGC RT PCR (ARMC ONLY)
Chlamydia Tr: NOT DETECTED
N gonorrhoeae: NOT DETECTED

## 2023-09-21 SURGERY — XI ROBOTIC ASSISTED VENTRAL HERNIA
Anesthesia: General | Laterality: Right

## 2023-09-21 SURGERY — REPAIR, HERNIA, INGUINAL, ROBOT-ASSISTED, LAPAROSCOPIC, USING MESH
Anesthesia: General | Laterality: Right

## 2023-09-21 MED ORDER — ONDANSETRON HCL 4 MG/2ML IJ SOLN
INTRAMUSCULAR | Status: DC | PRN
  Administered 2023-09-21: 4 mg via INTRAVENOUS

## 2023-09-21 MED ORDER — MIDAZOLAM HCL 2 MG/2ML IJ SOLN
INTRAMUSCULAR | Status: DC | PRN
  Administered 2023-09-21: 2 mg via INTRAVENOUS

## 2023-09-21 MED ORDER — SODIUM CHLORIDE 0.9 % IV SOLN: INTRAVENOUS | Status: DC

## 2023-09-21 MED ORDER — POLYETHYLENE GLYCOL 3350 17 G PO PACK
30.0000 g | PACK | Freq: Every day | ORAL | Status: DC
  Filled 2023-09-21 (×2): qty 2

## 2023-09-21 MED ORDER — ONDANSETRON HCL 4 MG/2ML IJ SOLN: 4.0000 mg | Freq: Four times a day (QID) | INTRAMUSCULAR | Status: DC | PRN

## 2023-09-21 MED ORDER — DIPHENHYDRAMINE HCL 25 MG PO CAPS
25.0000 mg | ORAL_CAPSULE | Freq: Every evening | ORAL | Status: DC | PRN
  Administered 2023-09-21: 25 mg via ORAL
  Filled 2023-09-21: qty 1

## 2023-09-21 MED ORDER — HYDRALAZINE HCL 20 MG/ML IJ SOLN: 10.0000 mg | INTRAMUSCULAR | Status: DC | PRN

## 2023-09-21 MED ORDER — ROCURONIUM BROMIDE 10 MG/ML (PF) SYRINGE
PREFILLED_SYRINGE | INTRAVENOUS | Status: AC
  Filled 2023-09-21: qty 10

## 2023-09-21 MED ORDER — KETOROLAC TROMETHAMINE 30 MG/ML IJ SOLN
INTRAMUSCULAR | Status: DC | PRN
  Administered 2023-09-21: 30 mg via INTRAVENOUS

## 2023-09-21 MED ORDER — PHENYLEPHRINE 80 MCG/ML (10ML) SYRINGE FOR IV PUSH (FOR BLOOD PRESSURE SUPPORT)
PREFILLED_SYRINGE | INTRAVENOUS | Status: AC
  Filled 2023-09-21: qty 10

## 2023-09-21 MED ORDER — ONDANSETRON HCL 4 MG/2ML IJ SOLN: 4.0000 mg | Freq: Once | INTRAMUSCULAR | Status: DC | PRN

## 2023-09-21 MED ORDER — LIDOCAINE HCL (CARDIAC) PF 100 MG/5ML IV SOSY
PREFILLED_SYRINGE | INTRAVENOUS | Status: DC | PRN
  Administered 2023-09-21: 80 mg via INTRAVENOUS

## 2023-09-21 MED ORDER — ROCURONIUM BROMIDE 100 MG/10ML IV SOLN
INTRAVENOUS | Status: DC | PRN
  Administered 2023-09-21: 10 mg via INTRAVENOUS
  Administered 2023-09-21: 70 mg via INTRAVENOUS

## 2023-09-21 MED ORDER — FENTANYL CITRATE (PF) 100 MCG/2ML IJ SOLN
INTRAMUSCULAR | Status: AC
  Filled 2023-09-21: qty 2

## 2023-09-21 MED ORDER — POLYVINYL ALCOHOL 1.4 % OP SOLN: 1.0000 [drp] | OPHTHALMIC | Status: DC | PRN

## 2023-09-21 MED ORDER — BUPIVACAINE LIPOSOME 1.3 % IJ SUSP
INTRAMUSCULAR | Status: DC | PRN
  Administered 2023-09-21: 20 mL

## 2023-09-21 MED ORDER — CEFAZOLIN SODIUM-DEXTROSE 2-4 GM/100ML-% IV SOLN
INTRAVENOUS | Status: AC
  Filled 2023-09-21: qty 100

## 2023-09-21 MED ORDER — PROPOFOL 10 MG/ML IV BOLUS
INTRAVENOUS | Status: AC
  Filled 2023-09-21: qty 20

## 2023-09-21 MED ORDER — MIDAZOLAM HCL 2 MG/2ML IJ SOLN
INTRAMUSCULAR | Status: AC
Start: 2023-09-21 — End: ?
  Filled 2023-09-21: qty 2

## 2023-09-21 MED ORDER — DIPHENHYDRAMINE HCL 25 MG PO CAPS: 25.0000 mg | ORAL_CAPSULE | Freq: Four times a day (QID) | ORAL | Status: DC | PRN

## 2023-09-21 MED ORDER — FENTANYL CITRATE (PF) 100 MCG/2ML IJ SOLN
INTRAMUSCULAR | Status: DC | PRN
  Administered 2023-09-21 (×2): 50 ug via INTRAVENOUS

## 2023-09-21 MED ORDER — ONDANSETRON 4 MG PO TBDP: 4.0000 mg | ORAL_TABLET | Freq: Four times a day (QID) | ORAL | Status: DC | PRN

## 2023-09-21 MED ORDER — 0.9 % SODIUM CHLORIDE (POUR BTL) OPTIME
TOPICAL | Status: DC | PRN
  Administered 2023-09-21: 500 mL

## 2023-09-21 MED ORDER — LACTATED RINGERS IV SOLN: INTRAVENOUS | Status: DC | PRN

## 2023-09-21 MED ORDER — ACETAMINOPHEN 325 MG PO TABS: 650.0000 mg | ORAL_TABLET | Freq: Four times a day (QID) | ORAL | Status: DC | PRN

## 2023-09-21 MED ORDER — DIPHENHYDRAMINE HCL 50 MG/ML IJ SOLN: 25.0000 mg | Freq: Four times a day (QID) | INTRAMUSCULAR | Status: DC | PRN

## 2023-09-21 MED ORDER — ACETAMINOPHEN 10 MG/ML IV SOLN: 1000.0000 mg | Freq: Once | INTRAVENOUS | Status: DC | PRN

## 2023-09-21 MED ORDER — PANTOPRAZOLE SODIUM 40 MG IV SOLR: 40.0000 mg | Freq: Every day | INTRAVENOUS | Status: DC

## 2023-09-21 MED ORDER — SUGAMMADEX SODIUM 200 MG/2ML IV SOLN
INTRAVENOUS | Status: DC | PRN
  Administered 2023-09-21: 200 mg via INTRAVENOUS

## 2023-09-21 MED ORDER — KETOROLAC TROMETHAMINE 30 MG/ML IJ SOLN
30.0000 mg | Freq: Four times a day (QID) | INTRAMUSCULAR | Status: AC
  Administered 2023-09-21: 30 mg via INTRAVENOUS
  Filled 2023-09-21: qty 1

## 2023-09-21 MED ORDER — IBUPROFEN 200 MG PO TABS: 200.0000 mg | ORAL_TABLET | Freq: Four times a day (QID) | ORAL | 0 refills | Status: AC | PRN

## 2023-09-21 MED ORDER — DEXAMETHASONE SODIUM PHOSPHATE 10 MG/ML IJ SOLN
INTRAMUSCULAR | Status: DC | PRN
  Administered 2023-09-21: 10 mg via INTRAVENOUS

## 2023-09-21 MED ORDER — DEXAMETHASONE SODIUM PHOSPHATE 10 MG/ML IJ SOLN
INTRAMUSCULAR | Status: AC
  Filled 2023-09-21: qty 1

## 2023-09-21 MED ORDER — OXYCODONE HCL 5 MG PO TABS
ORAL_TABLET | ORAL | Status: AC
  Filled 2023-09-21: qty 1

## 2023-09-21 MED ORDER — BUPIVACAINE-EPINEPHRINE (PF) 0.25% -1:200000 IJ SOLN
INTRAMUSCULAR | Status: AC
  Filled 2023-09-21: qty 30

## 2023-09-21 MED ORDER — DIPHENHYDRAMINE HCL 25 MG PO CAPS
25.0000 mg | ORAL_CAPSULE | Freq: Four times a day (QID) | ORAL | Status: DC | PRN
Start: 2023-09-21 — End: 2023-09-22

## 2023-09-21 MED ORDER — HYDROCODONE-ACETAMINOPHEN 5-325 MG PO TABS: 1.0000 | ORAL_TABLET | Freq: Four times a day (QID) | ORAL | 0 refills | Status: AC | PRN

## 2023-09-21 MED ORDER — KETOROLAC TROMETHAMINE 30 MG/ML IJ SOLN: 30.0000 mg | Freq: Four times a day (QID) | INTRAMUSCULAR | Status: DC | PRN

## 2023-09-21 MED ORDER — MORPHINE SULFATE (PF) 2 MG/ML IV SOLN
2.0000 mg | INTRAVENOUS | Status: DC | PRN
  Administered 2023-09-21: 2 mg via INTRAVENOUS
  Filled 2023-09-21: qty 1

## 2023-09-21 MED ORDER — ONDANSETRON HCL 4 MG/2ML IJ SOLN
INTRAMUSCULAR | Status: AC
  Filled 2023-09-21: qty 2

## 2023-09-21 MED ORDER — CEFAZOLIN SODIUM-DEXTROSE 2-4 GM/100ML-% IV SOLN
2.0000 g | Freq: Three times a day (TID) | INTRAVENOUS | Status: AC
  Administered 2023-09-21: 2 g via INTRAVENOUS
  Filled 2023-09-21: qty 100

## 2023-09-21 MED ORDER — CEFAZOLIN SODIUM-DEXTROSE 2-4 GM/100ML-% IV SOLN
2.0000 g | Freq: Once | INTRAVENOUS | Status: AC
  Administered 2023-09-21: 2 g via INTRAVENOUS
  Filled 2023-09-21: qty 100

## 2023-09-21 MED ORDER — OXYCODONE HCL 5 MG/5ML PO SOLN: 5.0000 mg | Freq: Once | ORAL | Status: AC | PRN

## 2023-09-21 MED ORDER — SODIUM CHLORIDE 0.9 % IV SOLN
2.0000 g | INTRAVENOUS | Status: DC
  Filled 2023-09-21: qty 2

## 2023-09-21 MED ORDER — PHENYLEPHRINE 80 MCG/ML (10ML) SYRINGE FOR IV PUSH (FOR BLOOD PRESSURE SUPPORT)
PREFILLED_SYRINGE | INTRAVENOUS | Status: DC | PRN
  Administered 2023-09-21 (×7): 80 ug via INTRAVENOUS

## 2023-09-21 MED ORDER — OXYCODONE-ACETAMINOPHEN 5-325 MG PO TABS: 1.0000 | ORAL_TABLET | ORAL | Status: DC | PRN

## 2023-09-21 MED ORDER — LIDOCAINE HCL (PF) 2 % IJ SOLN
INTRAMUSCULAR | Status: AC
  Filled 2023-09-21: qty 5

## 2023-09-21 MED ORDER — FENTANYL CITRATE (PF) 100 MCG/2ML IJ SOLN
25.0000 ug | INTRAMUSCULAR | Status: DC | PRN
  Administered 2023-09-21 (×2): 25 ug via INTRAVENOUS
  Administered 2023-09-21: 50 ug via INTRAVENOUS
  Administered 2023-09-21: 25 ug via INTRAVENOUS
  Administered 2023-09-21: 50 ug via INTRAVENOUS

## 2023-09-21 MED ORDER — PROPOFOL 10 MG/ML IV BOLUS
INTRAVENOUS | Status: DC | PRN
  Administered 2023-09-21: 130 mg via INTRAVENOUS

## 2023-09-21 MED ORDER — BUPIVACAINE-EPINEPHRINE (PF) 0.25% -1:200000 IJ SOLN
INTRAMUSCULAR | Status: DC | PRN
  Administered 2023-09-21: 30 mL

## 2023-09-21 MED ORDER — OXYCODONE HCL 5 MG PO TABS
5.0000 mg | ORAL_TABLET | Freq: Four times a day (QID) | ORAL | Status: DC | PRN
  Administered 2023-09-21 – 2023-09-22 (×4): 5 mg via ORAL
  Filled 2023-09-21 (×4): qty 1

## 2023-09-21 MED ORDER — CLOTRIMAZOLE 1 % EX CREA
1.0000 | TOPICAL_CREAM | Freq: Two times a day (BID) | CUTANEOUS | Status: DC
  Filled 2023-09-21: qty 15

## 2023-09-21 MED ORDER — OXYCODONE HCL 5 MG PO TABS
5.0000 mg | ORAL_TABLET | Freq: Once | ORAL | Status: AC | PRN
  Administered 2023-09-21: 5 mg via ORAL

## 2023-09-21 SURGICAL SUPPLY — 44 items
BLADE CLIPPER SURG (BLADE) ×1 IMPLANT
COVER TIP SHEARS 8 DVNC (MISCELLANEOUS) ×1 IMPLANT
COVER WAND RF STERILE (DRAPES) ×1 IMPLANT
DERMABOND ADVANCED .7 DNX12 (GAUZE/BANDAGES/DRESSINGS) ×1 IMPLANT
DRAPE ARM DVNC X/XI (DISPOSABLE) ×3 IMPLANT
DRAPE COLUMN DVNC XI (DISPOSABLE) ×1 IMPLANT
DRAPE UTILITY 15X26 TOWEL STRL (DRAPES) ×1 IMPLANT
ELECT REM PT RETURN 9FT ADLT (ELECTROSURGICAL) ×1
ELECTRODE REM PT RTRN 9FT ADLT (ELECTROSURGICAL) ×1 IMPLANT
FORCEPS BPLR R/ABLATION 8 DVNC (INSTRUMENTS) ×1 IMPLANT
GLOVE ORTHO TXT STRL SZ7.5 (GLOVE) ×3 IMPLANT
GOWN STRL REUS W/ TWL LRG LVL3 (GOWN DISPOSABLE) ×1 IMPLANT
GOWN STRL REUS W/ TWL XL LVL3 (GOWN DISPOSABLE) ×2 IMPLANT
GRASPER SUT TROCAR 14GX15 (MISCELLANEOUS) IMPLANT
IRRIGATION STRYKERFLOW (MISCELLANEOUS) IMPLANT
IRRIGATOR STRYKERFLOW (MISCELLANEOUS)
IV NS 1000ML BAXH (IV SOLUTION) IMPLANT
KIT PINK PAD W/HEAD ARE REST (MISCELLANEOUS) ×1 IMPLANT
KIT PINK PAD W/HEAD ARM REST (MISCELLANEOUS) ×1 IMPLANT
LABEL OR SOLS (LABEL) ×1 IMPLANT
MANIFOLD NEPTUNE II (INSTRUMENTS) ×1 IMPLANT
MESH 3DMAX LIGHT 4.8X6.7 RT XL (Mesh General) IMPLANT
NDL DRIVE SUT CUT DVNC (INSTRUMENTS) ×1 IMPLANT
NDL HYPO 22X1.5 SAFETY MO (MISCELLANEOUS) ×1 IMPLANT
NDL INSUFFLATION 14GA 120MM (NEEDLE) IMPLANT
NEEDLE DRIVE SUT CUT DVNC (INSTRUMENTS) ×1 IMPLANT
NEEDLE HYPO 22X1.5 SAFETY MO (MISCELLANEOUS) ×1 IMPLANT
NEEDLE INSUFFLATION 14GA 120MM (NEEDLE) ×1 IMPLANT
PACK LAP CHOLECYSTECTOMY (MISCELLANEOUS) ×1 IMPLANT
SCISSORS MNPLR CVD DVNC XI (INSTRUMENTS) ×1 IMPLANT
SEAL UNIV 5-12 XI (MISCELLANEOUS) ×3 IMPLANT
SET TUBE SMOKE EVAC HIGH FLOW (TUBING) ×1 IMPLANT
SOL ELECTROSURG ANTI STICK (MISCELLANEOUS) ×1
SOLUTION ELECTROSURG ANTI STCK (MISCELLANEOUS) ×1 IMPLANT
SUT MNCRL 4-0 27 PS-2 XMFL (SUTURE) ×1
SUT MNCRL 4-0 27XMFL (SUTURE) ×1
SUT STRATA 2-0 23CM CT-2 (SUTURE) IMPLANT
SUT STRATA 3-0 SH (SUTURE) IMPLANT
SUT VIC AB 2-0 SH 27XBRD (SUTURE) ×1 IMPLANT
SUT VIC AB 3-0 SH 27X BRD (SUTURE) IMPLANT
SUT VICRYL 0 UR6 27IN ABS (SUTURE) IMPLANT
SUTURE MNCRL 4-0 27XMF (SUTURE) ×1 IMPLANT
TRAP FLUID SMOKE EVACUATOR (MISCELLANEOUS) ×1 IMPLANT
WATER STERILE IRR 500ML POUR (IV SOLUTION) ×1 IMPLANT

## 2023-09-21 NOTE — Progress Notes (Signed)
Rx for D/cMeds of Norco and Advil.

## 2023-09-21 NOTE — Anesthesia Procedure Notes (Signed)
Procedure Name: Intubation Date/Time: 09/21/2023 2:33 PM  Performed by: Karoline Caldwell, CRNAPre-anesthesia Checklist: Patient identified, Patient being monitored, Timeout performed, Emergency Drugs available and Suction available Patient Re-evaluated:Patient Re-evaluated prior to induction Oxygen Delivery Method: Circle system utilized Preoxygenation: Pre-oxygenation with 100% oxygen Induction Type: IV induction Ventilation: Mask ventilation without difficulty Laryngoscope Size: McGrath and 4 Grade View: Grade I Tube type: Oral Tube size: 7.0 mm Number of attempts: 1 Airway Equipment and Method: Stylet Placement Confirmation: ETT inserted through vocal cords under direct vision, positive ETCO2 and breath sounds checked- equal and bilateral Secured at: 21 cm Tube secured with: Tape Dental Injury: Teeth and Oropharynx as per pre-operative assessment

## 2023-09-21 NOTE — ED Provider Notes (Signed)
Dr. Claudine Mouton with general surgery evaluated patient at the bedside.  He was able to reduce it at this time but given symptoms and concern for incarceration.  Plans to admit patient and take to the OR for repair.   Janith Lima, MD 09/21/23 [phone]

## 2023-09-21 NOTE — Anesthesia Postprocedure Evaluation (Signed)
Anesthesia Post Note  Patient: Derek Dunn  Procedure(s) Performed: XI ROBOTIC ASSISTED INGUINAL HERNIA REPAIR WITH MESH (Right)  Patient location during evaluation: PACU Anesthesia Type: General Level of consciousness: awake and alert and patient cooperative (returned to preoperative baseline) Pain management: pain level controlled Vital Signs Assessment: post-procedure vital signs reviewed and stable Respiratory status: spontaneous breathing, nonlabored ventilation, respiratory function stable and patient connected to nasal cannula oxygen Cardiovascular status: blood pressure returned to baseline and stable Postop Assessment: adequate PO intake Anesthetic complications: no   No notable events documented.   Last Vitals:  Vitals:   09/21/23 1645 09/21/23 1700  BP: (!) 134/94 126/78  Pulse: 71 63  Resp: 13 12  Temp:    SpO2: 100% 98%    Last Pain:  Vitals:   09/21/23 1700  TempSrc:   PainSc: 6                  Reed Breech

## 2023-09-21 NOTE — Anesthesia Preprocedure Evaluation (Signed)
Anesthesia Evaluation  Patient identified by MRN, date of birth, ID band Patient awake    Reviewed: Allergy & Precautions, NPO status , Patient's Chart, lab work & pertinent test results  History of Anesthesia Complications Negative for: history of anesthetic complications  Airway Mallampati: III  TM Distance: >3 FB Neck ROM: full    Dental  (+) Poor Dentition   Pulmonary neg sleep apnea, neg COPD, Current Smoker and Patient abstained from smoking.   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 No pertinent surgical history.  BMI    Body Mass Index: 23.75 kg/m      Reproductive/Obstetrics negative OB ROS                             Anesthesia Physical Anesthesia Plan  ASA: 3  Anesthesia Plan: General   Post-op Pain Management: Ofirmev IV (intra-op)* and Toradol IV (intra-op)*   Induction: Intravenous  PONV Risk Score and Plan: 2 and Ondansetron, Propofol infusion, TIVA and Midazolam  Airway Management Planned: Oral ETT and Video Laryngoscope Planned  Additional Equipment: None  Intra-op Plan:   Post-operative Plan: Extubation in OR  Informed Consent: I have reviewed the patients History and Physical, chart, labs and discussed the procedure including the risks, benefits and alternatives for the  proposed anesthesia with the patient or authorized representative who has indicated his/her understanding and acceptance.     Dental Advisory Given  Plan Discussed with: Anesthesiologist, CRNA and Surgeon  Anesthesia Plan Comments: (Patient consented for risks of anesthesia including but not limited to:  - adverse reactions to medications - damage to eyes, teeth, lips or other oral mucosa - nerve damage due to positioning  - sore throat or hoarseness - Damage to heart, brain, nerves, lungs, other parts of body or loss of life  Patient voiced understanding.)        Anesthesia Quick Evaluation

## 2023-09-21 NOTE — Interval H&P Note (Signed)
History and Physical Interval Note:  09/21/2023 1:49 PM  Derek Dunn  has presented today for surgery, with the diagnosis of right inguinal hernia.  The various methods of treatment have been discussed with the patient and family. After consideration of risks, benefits and other options for treatment, the patient has consented to  Procedure(s): XI ROBOTIC ASSISTED INGUINAL HERNIA REPAIR WITH MESH (Right) as a surgical intervention.  The patient's history has been reviewed, patient examined, no change in status, stable for surgery.  I have reviewed the patient's chart and labs.  Questions were answered to the patient's satisfaction.   Right groin is marked.   Campbell Lerner

## 2023-09-21 NOTE — Discharge Instructions (Addendum)
Your incision was closed with Dermabond.  It is best to keep it clean and dry, it will tolerate a brief shower, but do not soak it or apply any creams or lotions to the incisions.  The Dermabond should gradually flake off over time.  Keep it open to air so you can evaluate your incisions.  Dermabond assists the underlying sutures to keep your incision closed and protected from infection.  Should you develop some drainage from your incision, some drops of drainage would be okay but if it persists continue to put keep a dry dressing over it.,

## 2023-09-21 NOTE — Op Note (Signed)
Robotic assisted Laparoscopic Transabdominal Right Inguinal Hernia Repair with Mesh    Pre-operative Diagnosis:  Right  Inguinal Hernia   Post-operative Diagnosis: Same   Procedure: Robotic assisted Laparoscopic  repair of right inguinal hernia(s)   Surgeon: Campbell Lerner, M.D., FACS   Anesthesia: GETA   Findings: Right indirect inguinal hernia, no  evidence of left sided hernia.         Procedure Details  The patient was seen again in the Holding Room. The benefits, complications, treatment options, and expected outcomes were discussed with the patient. The risks of bleeding, infection, recurrence of symptoms, failure to resolve symptoms, recurrence of hernia, ischemic orchitis, chronic pain syndrome or neuroma, were reviewed again. The likelihood of improving the patient's symptoms with return to their baseline status is good.  The patient and/or family concurred with the proposed plan, giving informed consent.  The patient was taken to Operating Room, identified  and the procedure verified as Laparoscopic Inguinal Hernia Repair. Laterality confirmed.  A Time Out was held and the above information confirmed.   Prior to the induction of general anesthesia, antibiotic prophylaxis was administered. VTE prophylaxis was in place. General endotracheal anesthesia was then administered and tolerated well. After the induction, the abdomen was prepped with Chloraprep and draped in the sterile fashion. The patient was positioned in the supine position.   After local infiltration of quarter percent Marcaine with epinephrine, stab incision was made left upper quadrant.  On the left at Palmer's point, the Veress needle is passed with sensation of the layers to penetrate the abdominal wall and into the peritoneum.  Saline drop test is confirmed peritoneal placement.  Insufflation is initiated with carbon dioxide to pressures of 15 mmHg. An 8.5 mm port is placed to the left off of the midline, with blunt  tipped trocar.  Pneumoperitoneum maintained w/o HD changes to pressures of 15 mm Hg with CO2. No evidence of bowel injuries.  Two 8.5 mm ports placed under direct vision in each upper quadrant. The laparoscopy revealed a large right indirect defect(s).   The robot was brought to the table and docked in the standard fashion, no collision between arms was observed. Instruments were kept under direct view at all times. For right inguinal hernia repair,  I developed a peritoneal flap. The sac(s) were reduced and dissected free from adjacent structures. We preserved the vas and the vessels, and visualized them to their convergence and beyond in the retroperitoneum. Once dissection was completed a right sided extra large  BARD 3D Light mesh was placed and secured at four points with interrupted 2-0 Vicryl to the pubic tubercle and anteriorly. There was good coverage of the direct, indirect and femoral spaces. Second look revealed no complications or injuries. The flap was then closed with 3-0 V-lock suture.  Peritoneal closure without defects.  Once assuring that hemostasis was adequate, all needles/sponges removed, and the robot was undocked.  Under direct visualization I placed the Veress needle into the preperitoneal space the Veress' valve was released allowing extraperitoneal CO2 to escape, it was also used to access the space for supplemental local anesthesia. The ports were removed, the abdomen desulflated.  4-0 subcuticular Monocryl was used at all skin edges. Dermabond was placed.  Patient tolerated the procedure well. There were no complications. He was taken to the recovery room in stable condition.           Campbell Lerner, M.D., FACS 09/21/2023, 4:19 PM

## 2023-09-21 NOTE — ED Notes (Signed)
Informed RN bed assigned 

## 2023-09-21 NOTE — Transfer of Care (Signed)
Immediate Anesthesia Transfer of Care Note  Patient: Derek Dunn  Procedure(s) Performed: XI ROBOTIC ASSISTED INGUINAL HERNIA REPAIR WITH MESH (Right)  Patient Location: PACU  Anesthesia Type:General  Level of Consciousness: awake  Airway & Oxygen Therapy: Patient Spontanous Breathing and Patient connected to face mask oxygen  Post-op Assessment: Report given to RN and Post -op Vital signs reviewed and stable  Post vital signs: Reviewed and stable  Last Vitals:  Vitals Value Taken Time  BP    Temp    Pulse 84 09/21/23 1628  Resp 15 09/21/23 1628  SpO2 100 % 09/21/23 1628  Vitals shown include unfiled device data.  Last Pain:  Vitals:   09/21/23 1404  TempSrc: Oral  PainSc: 7       Patients Stated Pain Goal: 0 (09/21/23 1404)  Complications: No notable events documented.

## 2023-09-21 NOTE — H&P (Signed)
Patient ID: Derek Dunn, male   DOB: September 25, 1970, 53 y.o.   MRN: [id number]  Chief Complaint: Right groin pain  History of Present Illness Derek Dunn is a 53 y.o. male with longstanding well-known right groin hernia, initially sought consultation for repair but was identified to have a back abscess.  Usual state of pain and groin bulge until yesterday when at the end of his micturition he had severe pain which doubled him over.  This new onset of pain occurred yesterday afternoon and progressed in the evening.  He stated [name] and [facility] his homeless shelter last night, and somehow it became worse today.  Progressively worse to where he presented to the ED.  He denies fevers, chills.  He denies nausea, vomiting.  Prior abdominal surgery includes an appendectomy for he reports gangrenous appendicitis.  And treatment of his back abscess. It was reported that following a right groin ultrasound confirming bowel within his right inguinal hernia, that it was not reducible.  A CT scan was ordered.  Past Medical History Past Medical History:  Diagnosis Date   Bacteremia    Endocarditis    Hyperlipidemia    Osteomyelitis of thoracic spine (HCC)       Past Surgical History:  Procedure Laterality Date   POSTERIOR LAMINECTOMY THORACIC SPINE      No Known Allergies  Current Facility-Administered Medications  Medication Dose Route Frequency Provider Last Rate Last Admin   cefTRIAXone (ROCEPHIN) 1 g in sodium chloride 0.9 % 100 mL IVPB  1 g Intravenous Once Mumma, Carollee Herter, MD 200 mL/hr at 09/21/23 0005 1 g at 09/21/23 0005   Current Outpatient Medications  Medication Sig Dispense Refill   clotrimazole (LOTRIMIN) 1 % cream Apply 1 Application topically 2 (two) times daily. 28 g 0   polyethylene glycol (MIRALAX / GLYCOLAX) 17 g packet Take 17 g by mouth daily. 30 each 0   polyvinyl alcohol (LIQUIFILM TEARS) 1.4 % ophthalmic solution Place 1 drop into both eyes as needed for dry eyes. 15 mL 0     Family History History reviewed. No pertinent family history.    Social History Social History   Tobacco Use   Smoking status: Every Day    Current packs/day: 1.00    Average packs/day: 1 pack/day for 15.0 years (15.0 ttl pk-yrs)    Types: Cigarettes   Smokeless tobacco: Never  Vaping Use   Vaping status: Never Used  Substance Use Topics   Alcohol use: No   Drug use: Yes    Types: IV, Methamphetamines    Comment: suboxone, last heroin use 3 days ago        Review of Systems  All other systems reviewed and are negative.    Physical Exam Blood pressure 134/84, pulse 69, temperature 98.5 F (36.9 C), temperature source Oral, resp. rate 20, height 6\' 4"  (1.93 m), weight 97.5 kg, SpO2 97%. Last Weight  Most recent update: 09/20/2023  2:32 PM    Weight  97.5 kg (215 lb)             CONSTITUTIONAL: Well developed, and nourished, appropriately responsive and aware without distress.   EYES: Sclera non-icteric.   EARS, NOSE, MOUTH AND THROAT:  The oropharynx is clear. Oral mucosa is pink and moist.     Hearing is intact to voice.  NECK: Trachea is midline, and there is no jugular venous distension.  LYMPH NODES:  Lymph nodes in the neck are not appreciated. RESPIRATORY:  Lungs are clear, and breath sounds  are equal bilaterally.  Normal respiratory effort without pathologic use of accessory muscles. CARDIOVASCULAR: Heart is regular in rate and rhythm.   Well perfused.  GI: The abdomen is  soft, nontender, and nondistended.  There is a transverse right lower quadrant scar.  There were no palpable masses.  I did not appreciate hepatosplenomegaly.  GU: Large right groin hernia with contents and proximal scrotum, reduced with effort well-tolerated. MUSCULOSKELETAL:  Symmetrical muscle tone appreciated in all four extremities.    SKIN: Skin turgor is normal. No pathologic skin lesions appreciated.  NEUROLOGIC:  Motor and sensation appear grossly normal.  Cranial nerves are  grossly without defect. PSYCH:  Alert and oriented to person, place and time. Affect is appropriate for situation.  Data Reviewed I have personally reviewed what is currently available of the patient's imaging, recent labs and medical records.   Labs:     Latest Ref Rng & Units 09/20/2023    2:34 PM 04/24/2022    6:37 AM 04/21/2022    6:19 AM  CBC  WBC 4.0 - 10.5 K/uL 5.8   2.4   Hemoglobin 13.0 - 17.0 g/dL 82.9  56.2  13.0   Hematocrit 39.0 - 52.0 % 37.0   34.6   Platelets 150 - 400 K/uL 318   185       Latest Ref Rng & Units 09/20/2023    2:34 PM 04/25/2022    6:30 AM 04/24/2022    6:37 AM  CMP  Glucose 70 - 99 mg/dL 97  865  784   BUN 6 - 20 mg/dL 22  12  15    Creatinine 0.61 - 1.24 mg/dL 6.96  2.95  2.84   Sodium 135 - 145 mmol/L 139  139  139   Potassium 3.5 - 5.1 mmol/L 4.6  4.1  4.3   Chloride 98 - 111 mmol/L 106  108  107   CO2 22 - 32 mmol/L 27  26  25    Calcium 8.9 - 10.3 mg/dL 9.0  9.3  9.4   Total Protein 6.5 - 8.1 g/dL 7.1  7.1  7.1   Total Bilirubin 0.0 - 1.2 mg/dL 0.5  7.0  6.1   Alkaline Phos 38 - 126 U/L 68  158  169   AST 15 - 41 U/L 19  2,524  2,299   ALT 0 - 44 U/L 12  923  800     Imaging: Radiological images reviewed:   Within last 24 hrs: [place] RT LOWER EXTREM LTD SOFT TISSUE NON VASCULAR Result Date: 09/20/2023 CLINICAL DATA:  Right groin pain.  Evaluate for hernia EXAM: ULTRASOUND right LOWER EXTREMITY LIMITED TECHNIQUE: Ultrasound examination of the lower extremity soft tissues was performed in the area of clinical concern. COMPARISON:  CT of the abdomen pelvis dated 03/31/2022. FINDINGS: There is a moderate right inguinal hernia containing fat and bowel. A small fat containing left inguinal hernia noted. Overall similar appearance to the prior CT. IMPRESSION: Moderate right and small left inguinal hernias. The right inguinal hernia contains a loop of bowel. Electronically Signed   By: Elgie Collard M.D.   On: 09/20/2023 17:28    Assessment    Right  inguinal hernia, freshly reduced. Patient Active Problem List   Diagnosis Date Noted   Acute metabolic encephalopathy 04/20/2022   Stage II decubitus ulcer (HCC) 04/06/2022   Anemia of chronic disease 04/05/2022   Umbilical hernia without obstruction and without gangrene    Left inguinal hernia    Acute hepatitis  B 04/01/2022   Hepatitis C antibody test positive 04/01/2022   Transaminitis 03/31/2022   Insomnia 03/18/2022   Hypotension 03/16/2022   Right inguinal hernia 03/15/2022   Back pain 03/15/2022   Discitis thoracic region 03/15/2022   Septic arthritis (HCC) 03/15/2022   Vertebral osteomyelitis (HCC) 01/07/2022   Epidural abscess 01/07/2022   Homeless 01/05/2022   Tobacco use 01/05/2022   Polysubstance abuse (HCC) 01/05/2022   Left low back pain 01/05/2022   Bacteremia due to Staphylococcus aureus 01/05/2022   Fever 01/04/2022    Plan    Due to patient's social status, and pleading not to discharge him to the street, also having nowhere else to go we will proceed with admission, keep him n.p.o. anticipating immediate recurrence as per his report and his HPI.  Will anticipate robotic assisted right inguinal hernia repair.  I discussed possibility of incarceration, strangulation, enlargement in size over time, and the need for emergency surgery in the face of these.  Also reviewed the techniques of reduction should incarceration occur, and when unsuccessful to present to the ED.  Also discussed that surgery risks include recurrence which can be up to 30% in the case of complex hernias, use of prosthetic materials (mesh) and the increased risk of infection and the possible need for re-operation and removal of mesh, possibility of post-op SBO or ileus, and the risks of general anesthetic including heart attack, stroke, sudden death or some reaction to anesthetic medications. The patient, and those present, appear to understand the risks, any and all questions were answered to the  patient's satisfaction.  No guarantees were ever expressed or implied.  Face-to-face time spent with the patient and accompanying care providers(if present) was 45 minutes, with more than 50% of the time spent counseling, educating, and coordinating care of the patient.    These notes generated with voice recognition software. I apologize for typographical errors.  Campbell Lerner M.D., FACS 09/21/2023, 12:14 AM

## 2023-09-22 MED ORDER — HYDROCODONE-ACETAMINOPHEN 5-325 MG PO TABS: 1.0000 | ORAL_TABLET | Freq: Four times a day (QID) | ORAL | 0 refills | Status: AC | PRN

## 2023-09-22 MED ORDER — IBUPROFEN 800 MG PO TABS: 800.0000 mg | ORAL_TABLET | Freq: Three times a day (TID) | ORAL | 0 refills | Status: AC | PRN

## 2023-09-22 NOTE — Plan of Care (Signed)

## 2023-09-22 NOTE — Discharge Summary (Signed)
Patient ID: Derek Dunn MRN: [id number] DOB/AGE: Jul 02, 1971 53 y.o.  Admit date: 09/20/2023 Discharge date: 09/22/2023   Discharge Diagnoses:  Principal Problem:   Right inguinal hernia Active Problems:   S/P laparoscopic hernia repair   Procedures:Robotic assisted laparoscopic repair of right inguinal hernia  Hospital Course:  Patient came to the hospital for surgical management of right inguinal hernia.  He he underwent robotic assisted laparoscopic right inguinal hernia repair with mesh.  Due to not having anyone to stay with him overnight, he needed to be admitted for adequate observation postop.  This morning patient with expected postoperative pain.  Minimal swelling of the right groin.  No bruise.  No bulging.  Incisions are dry and clean.  Patient tolerating diet.  Physical Exam Vitals reviewed.  HENT:     Head: Normocephalic.  Cardiovascular:     Rate and Rhythm: Normal rate and regular rhythm.     Pulses: Normal pulses.  Pulmonary:     Effort: Pulmonary effort is normal.  Abdominal:     General: Abdomen is flat. There is no distension.     Tenderness: There is no abdominal tenderness.     Hernia: No hernia is present.  Musculoskeletal:     Cervical back: Normal range of motion.  Skin:    Capillary Refill: Capillary refill takes less than 2 seconds.  Neurological:     Mental Status: He is alert and oriented to person, place, and time.      Consults: None  Disposition: Discharge disposition: 01-Home or Self Care       Discharge Instructions     Call MD for:  persistant nausea and vomiting   Complete by: As directed    Call MD for:  redness, tenderness, or signs of infection (pain, swelling, redness, odor or green/yellow discharge around incision site)   Complete by: As directed    Call MD for:  severe uncontrolled pain   Complete by: As directed    Diet - low sodium heart healthy   Complete by: As directed    Discharge instructions   Complete  by: As directed    May resume aspirin or other anticoagulants after 48 hours.   Discharge wound care:   Complete by: As directed    Your incision was closed with Dermabond.  It is best to keep it clean and dry, it will tolerate a brief shower, but do not soak it or apply any creams or lotions to the incisions.  The Dermabond should gradually flake off over time.  Keep it open to air so you can evaluate your incisions.  Dermabond assists the underlying sutures to keep your incision closed and protected from infection.  Should you develop some drainage from your incision, some drops of drainage would be okay but if it persists continue to put keep a dry dressing over it.   Driving Restrictions   Complete by: As directed    No driving until cleared after follow-up appointment.  Is not advised to drive while taking narcotic pain medications or in significant pain.   Increase activity slowly   Complete by: As directed    Lifting restrictions   Complete by: As directed    Strongly advised against any form of lifting greater than 15 pounds over the next 4 to 6 weeks.  This involves pushing/pulling movements as well.  After 4 weeks when may gradually engage in more activities remaining aware of any new pain/tenderness elicited, and avoiding those for the full duration of  6 weeks.  Walking is encouraged.  Climbing stairs with caution.      Allergies as of 09/22/2023   No Known Allergies      Medication List     TAKE these medications    Antifungal Clotrimazole 1 % cream Generic drug: clotrimazole Apply 1 Application topically 2 (two) times daily.   HYDROcodone-acetaminophen 5-325 MG tablet Commonly known as: NORCO/VICODIN Take 1 tablet by mouth every 6 (six) hours as needed for moderate pain (pain score 4-6).   HYDROcodone-acetaminophen 5-325 MG tablet Commonly known as: NORCO/VICODIN Take 1 tablet by mouth every 6 (six) hours as needed for moderate pain (pain score 4-6).   ibuprofen 200  MG tablet Commonly known as: Advil Take 1 tablet (200 mg total) by mouth every 6 (six) hours as needed.   ibuprofen 800 MG tablet Commonly known as: ADVIL Take 1 tablet (800 mg total) by mouth every 8 (eight) hours as needed.   polyethylene glycol 17 g packet Commonly known as: MIRALAX / GLYCOLAX Take 17 grams (1 packet) by mouth daily. (Take 17 g by mouth daily.)   polyvinyl alcohol 1.4 % ophthalmic solution Commonly known as: LIQUIFILM TEARS Place 1 drop into both eyes as needed for dry eyes.               Discharge Care Instructions  (From admission, onward)           Start     Ordered   09/22/23 0000  Discharge wound care:       Comments: Your incision was closed with Dermabond.  It is best to keep it clean and dry, it will tolerate a brief shower, but do not soak it or apply any creams or lotions to the incisions.  The Dermabond should gradually flake off over time.  Keep it open to air so you can evaluate your incisions.  Dermabond assists the underlying sutures to keep your incision closed and protected from infection.  Should you develop some drainage from your incision, some drops of drainage would be okay but if it persists continue to put keep a dry dressing over it.   09/22/23 0815            Follow-up Information     Donovan Kail, PA-C. Schedule an appointment as soon as possible for a visit on 10/10/2023.   Specialty: Physician Assistant Contact information: [address] [place] [address] [phone]                 This discharge encounter was for 45-minutes most of the time counseling the patient and coordinating plan of care.

## 2023-09-22 NOTE — TOC Initial Note (Addendum)
Transition of Care [facility]) - Initial/Assessment Note    Patient Details  Name: Derek Dunn MRN: [id number] Date of Birth: 12-21-1970  Transition of Care [facility]) CM/SW Contact:    Liliana Cline, LCSW Phone Number: 09/22/2023, 11:59 AM  Clinical Narrative:                 CSW spoke with patient regarding DC. Patient states he is from a shelter on [address] in [place] called [facility] and [employer]. He is not sure of the exact address, states it is next to [facility] on [address]. Patient is requesting a cab there, does not have a ride or funds to pay for a cab. Cab voucher given to RN to call when ready. [place] Waiver being signed and placed in hard chart. Patient is uninsured and no PCP. Patient also flagged for SDOH food, utilities, and transportation. Patient declined all resources.   Expected Discharge Plan: Homeless Shelter Barriers to Discharge: Barriers Resolved   Patient Goals and CMS Choice Patient states their goals for this hospitalization and ongoing recovery are:: return to shelter CMS Medicare.gov Compare Post Acute Care list provided to:: Patient Choice offered to / list presented to : Patient      Expected Discharge Plan and Services       Living arrangements for the past 2 months: Homeless Shelter Expected Discharge Date: 09/21/23                                    Prior Living Arrangements/Services Living arrangements for the past 2 months: Homeless Shelter   Patient language and need for interpreter reviewed:: Yes Do you feel safe going back to the place where you live?: Yes            Criminal Activity/Legal Involvement Pertinent to Current Situation/Hospitalization: No - Comment as needed  Activities of Daily Living   ADL Screening (condition at time of admission) Independently performs ADLs?: Yes (appropriate for developmental age) Is the patient deaf or have difficulty hearing?: No Does the patient have  difficulty seeing, even when wearing glasses/contacts?: No Does the patient have difficulty concentrating, remembering, or making decisions?: No  Permission Sought/Granted                  Emotional Assessment       Orientation: : Oriented to Place, Oriented to Situation, Oriented to  Time, Oriented to Self Alcohol / Substance Use: Not Applicable Psych Involvement: No (comment)  Admission diagnosis:  Right inguinal hernia [K40.90] S/P laparoscopic hernia repair [H08.657, Z87.19] Patient Active Problem List   Diagnosis Date Noted   S/P laparoscopic hernia repair 09/21/2023   Acute metabolic encephalopathy 04/20/2022   Stage II decubitus ulcer (HCC) 04/06/2022   Anemia of chronic disease 04/05/2022   Umbilical hernia without obstruction and without gangrene    Left inguinal hernia    Acute hepatitis B 04/01/2022   Hepatitis C antibody test positive 04/01/2022   Transaminitis 03/31/2022   Insomnia 03/18/2022   Hypotension 03/16/2022   Right inguinal hernia 03/15/2022   Back pain 03/15/2022   Discitis thoracic region 03/15/2022   Septic arthritis (HCC) 03/15/2022   Vertebral osteomyelitis (HCC) 01/07/2022   Epidural abscess 01/07/2022   Homeless 01/05/2022   Tobacco use 01/05/2022   Polysubstance abuse (HCC) 01/05/2022   Left low back pain 01/05/2022   Bacteremia due to Staphylococcus aureus 01/05/2022   Fever 01/04/2022  PCP:  Pcp, No Pharmacy:   [facility] [address] [place] - [address] [place] [address] Phone: [phone] Fax: [phone]  [facility] [address] [place] [address] Phone: [phone] Fax: [phone]     Social Drivers of Health (SDOH) Social History: SDOH Screenings   Food Insecurity: Food Insecurity Present (09/21/2023)  Housing: High Risk (09/21/2023)  Transportation Needs: Unmet Transportation Needs (09/21/2023)  Utilities: At Risk  (09/21/2023)  Tobacco Use: High Risk (09/21/2023)   SDOH Interventions: Housing Interventions: Inpatient TOC   Readmission Risk Interventions     No data to display

## 2023-09-24 ENCOUNTER — Encounter: Payer: Self-pay | Admitting: Surgery

## 2023-09-24 LAB — URINE CULTURE: Culture: >=100000 — AB
